# Patient Record
Sex: Female | Born: 1955 | Race: White | Hispanic: No | Marital: Single | Smoking: Former smoker
Health system: Southern US, Community
[De-identification: ages and names within clinical notes are randomized; demographics above are authoritative.]

## PROBLEM LIST (undated history)

## (undated) DIAGNOSIS — E785 Hyperlipidemia, unspecified: Secondary | ICD-10-CM

## (undated) DIAGNOSIS — I10 Essential (primary) hypertension: Secondary | ICD-10-CM

## (undated) DIAGNOSIS — F419 Anxiety disorder, unspecified: Secondary | ICD-10-CM

## (undated) DIAGNOSIS — F329 Major depressive disorder, single episode, unspecified: Secondary | ICD-10-CM

## (undated) DIAGNOSIS — F32A Depression, unspecified: Secondary | ICD-10-CM

## (undated) HISTORY — DX: Hyperlipidemia, unspecified: E78.5

## (undated) HISTORY — PX: ABDOMINAL HYSTERECTOMY: SHX81

## (undated) HISTORY — PX: TONSILLECTOMY: SUR1361

---

## 2005-10-06 ENCOUNTER — Inpatient Hospital Stay (HOSPITAL_COMMUNITY): Admission: AC | Admit: 2005-10-06 | Discharge: 2005-10-14 | Payer: Self-pay

## 2005-10-11 ENCOUNTER — Encounter: Payer: Self-pay | Admitting: Vascular Surgery

## 2005-11-21 ENCOUNTER — Ambulatory Visit (HOSPITAL_COMMUNITY): Admission: RE | Admit: 2005-11-21 | Discharge: 2005-11-21 | Payer: Self-pay | Admitting: Neurosurgery

## 2007-03-29 ENCOUNTER — Ambulatory Visit: Payer: Self-pay | Admitting: *Deleted

## 2007-03-29 ENCOUNTER — Ambulatory Visit: Payer: Self-pay | Admitting: Internal Medicine

## 2007-06-20 ENCOUNTER — Ambulatory Visit: Payer: Self-pay | Admitting: Internal Medicine

## 2007-06-21 ENCOUNTER — Encounter (INDEPENDENT_AMBULATORY_CARE_PROVIDER_SITE_OTHER): Payer: Self-pay | Admitting: Internal Medicine

## 2007-07-18 ENCOUNTER — Encounter: Payer: Self-pay | Admitting: Family Medicine

## 2007-07-18 ENCOUNTER — Ambulatory Visit: Payer: Self-pay | Admitting: Internal Medicine

## 2007-07-18 LAB — CONVERTED CEMR LAB
ALT: 16 units/L (ref 0–35)
AST: 15 units/L (ref 0–37)
Albumin: 4.2 g/dL (ref 3.5–5.2)
Alkaline Phosphatase: 71 units/L (ref 39–117)
BUN: 5 mg/dL — ABNORMAL LOW (ref 6–23)
Basophils Absolute: 0 10*3/uL (ref 0.0–0.1)
Basophils Relative: 1 % (ref 0–1)
CO2: 23 meq/L (ref 19–32)
Calcium: 8.6 mg/dL (ref 8.4–10.5)
Chloride: 106 meq/L (ref 96–112)
Cholesterol: 224 mg/dL — ABNORMAL HIGH (ref 0–200)
Creatinine, Ser: 0.66 mg/dL (ref 0.40–1.20)
Eosinophils Absolute: 0.2 10*3/uL (ref 0.0–0.7)
Eosinophils Relative: 4 % (ref 0–5)
Glucose, Bld: 104 mg/dL — ABNORMAL HIGH (ref 70–99)
HCT: 47 % — ABNORMAL HIGH (ref 36.0–46.0)
HDL: 35 mg/dL — ABNORMAL LOW (ref >39)
Hemoglobin: 15.5 g/dL — ABNORMAL HIGH (ref 12.0–15.0)
LDL Cholesterol: 147 mg/dL — ABNORMAL HIGH (ref 0–99)
Lymphocytes Relative: 17 % (ref 12–46)
Lymphs Abs: 1.2 10*3/uL (ref 0.7–4.0)
MCHC: 33 g/dL (ref 30.0–36.0)
MCV: 94.6 fL (ref 78.0–100.0)
Monocytes Absolute: 0.5 10*3/uL (ref 0.1–1.0)
Monocytes Relative: 7 % (ref 3–12)
Neutro Abs: 5 10*3/uL (ref 1.7–7.7)
Neutrophils Relative %: 72 % (ref 43–77)
Platelets: 317 10*3/uL (ref 150–400)
Potassium: 3.8 meq/L (ref 3.5–5.3)
RBC: 4.97 M/uL (ref 3.87–5.11)
RDW: 14.3 % (ref 11.5–15.5)
Sodium: 141 meq/L (ref 135–145)
TSH: 1.607 microintl units/mL (ref 0.350–5.50)
Total Bilirubin: 0.7 mg/dL (ref 0.3–1.2)
Total CHOL/HDL Ratio: 6.4
Total Protein: 7.5 g/dL (ref 6.0–8.3)
Triglycerides: 209 mg/dL — ABNORMAL HIGH (ref <150)
VLDL: 42 mg/dL — ABNORMAL HIGH (ref 0–40)
WBC: 6.9 10*3/uL (ref 4.0–10.5)

## 2007-07-25 ENCOUNTER — Ambulatory Visit: Payer: Self-pay | Admitting: Internal Medicine

## 2007-07-30 ENCOUNTER — Ambulatory Visit (HOSPITAL_COMMUNITY): Admission: RE | Admit: 2007-07-30 | Discharge: 2007-07-30 | Payer: Self-pay | Admitting: Family Medicine

## 2008-05-30 IMAGING — CR DG CHEST 1V PORT
1 series · 1 of 1 positions shown · non-contrast
Comparison: 10/08/05.

CLINICAL DATA: Pneumothorax.
 PORTABLE CHEST - 1 VIEW - 10/09/05:

[view not recorded]
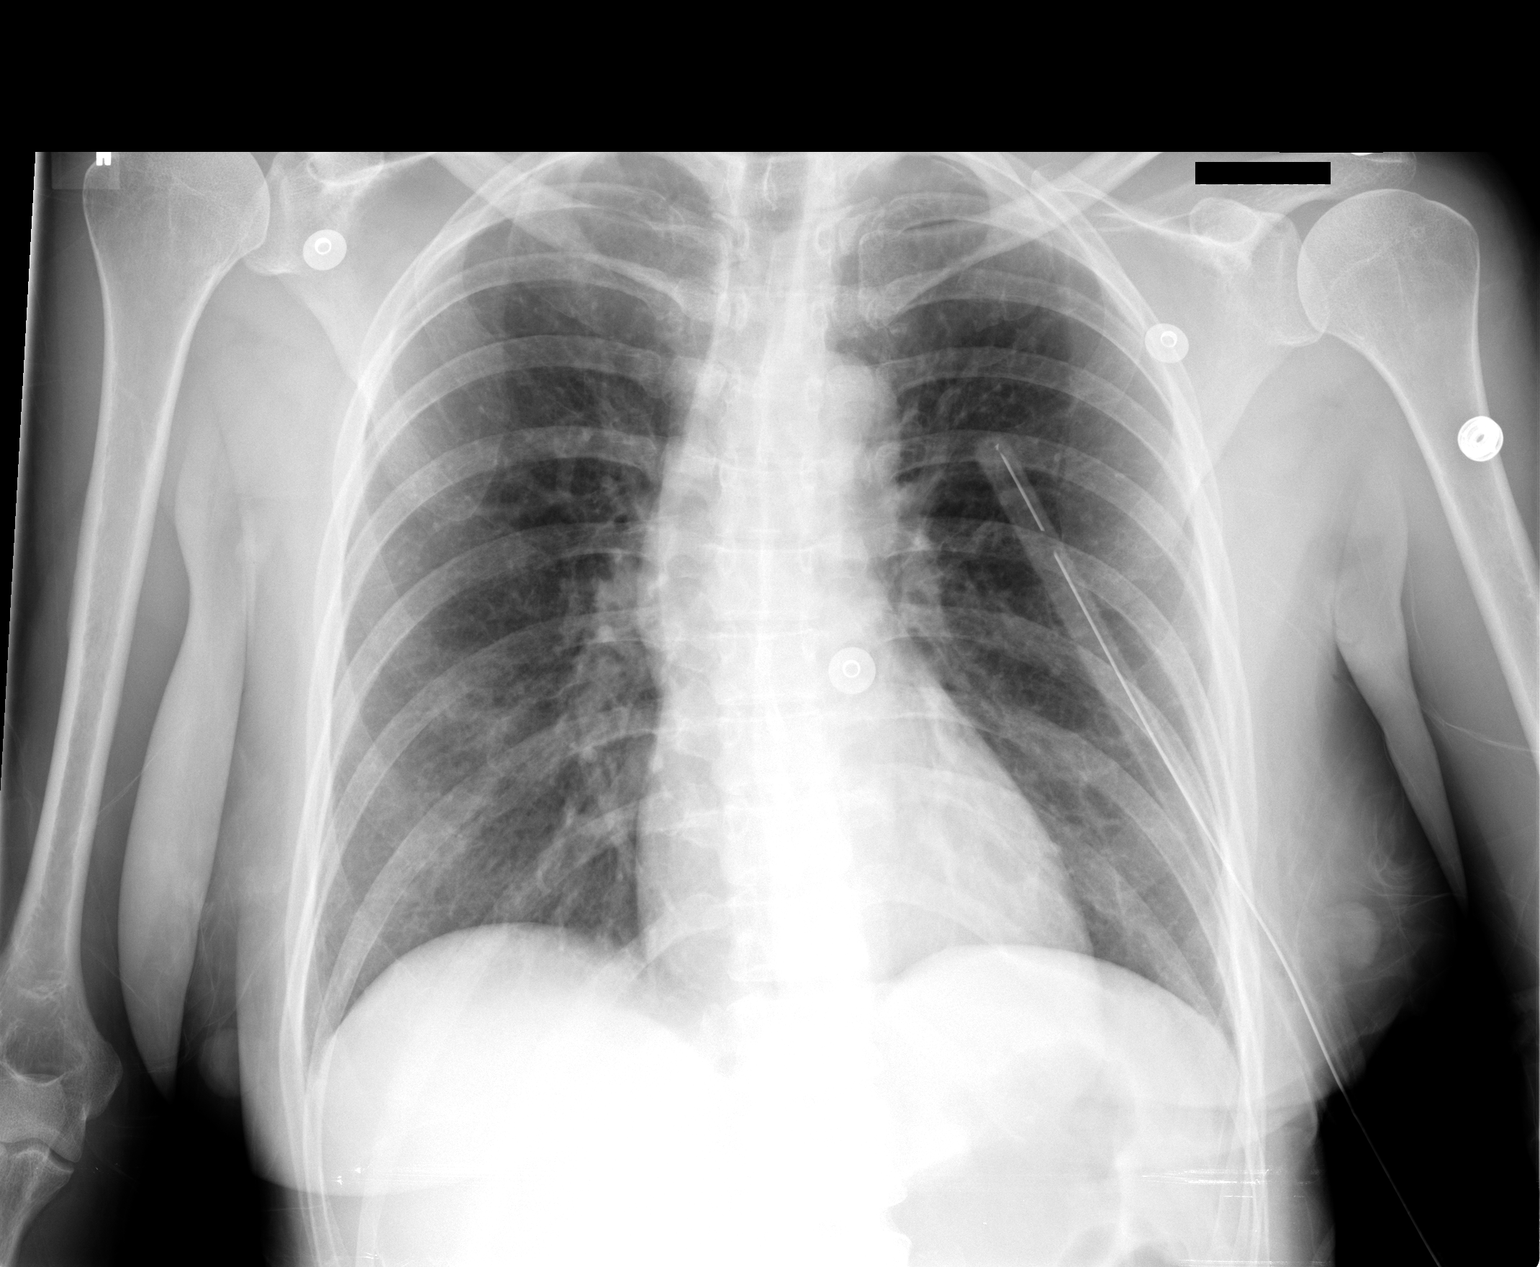

[1 of 1 positions shown; findings below may reference images not displayed]

FINDINGS: A left-sided chest tube remains in place.  There is a persistent left apical pneumothorax, less than 5%.  No mediastinal shift.  No other change.
IMPRESSION: Stable tiny left apical pneumothorax.

## 2008-06-02 IMAGING — CR DG CHEST 1V PORT
1 series · 1 of 1 positions shown · non-contrast
Comparison: 10/11/05.

CLINICAL DATA: Pneumothorax. 
 PORTABLE CHEST ? 1 VIEW ? 10/12/05 AT 0000 HOURS:

[view not recorded]
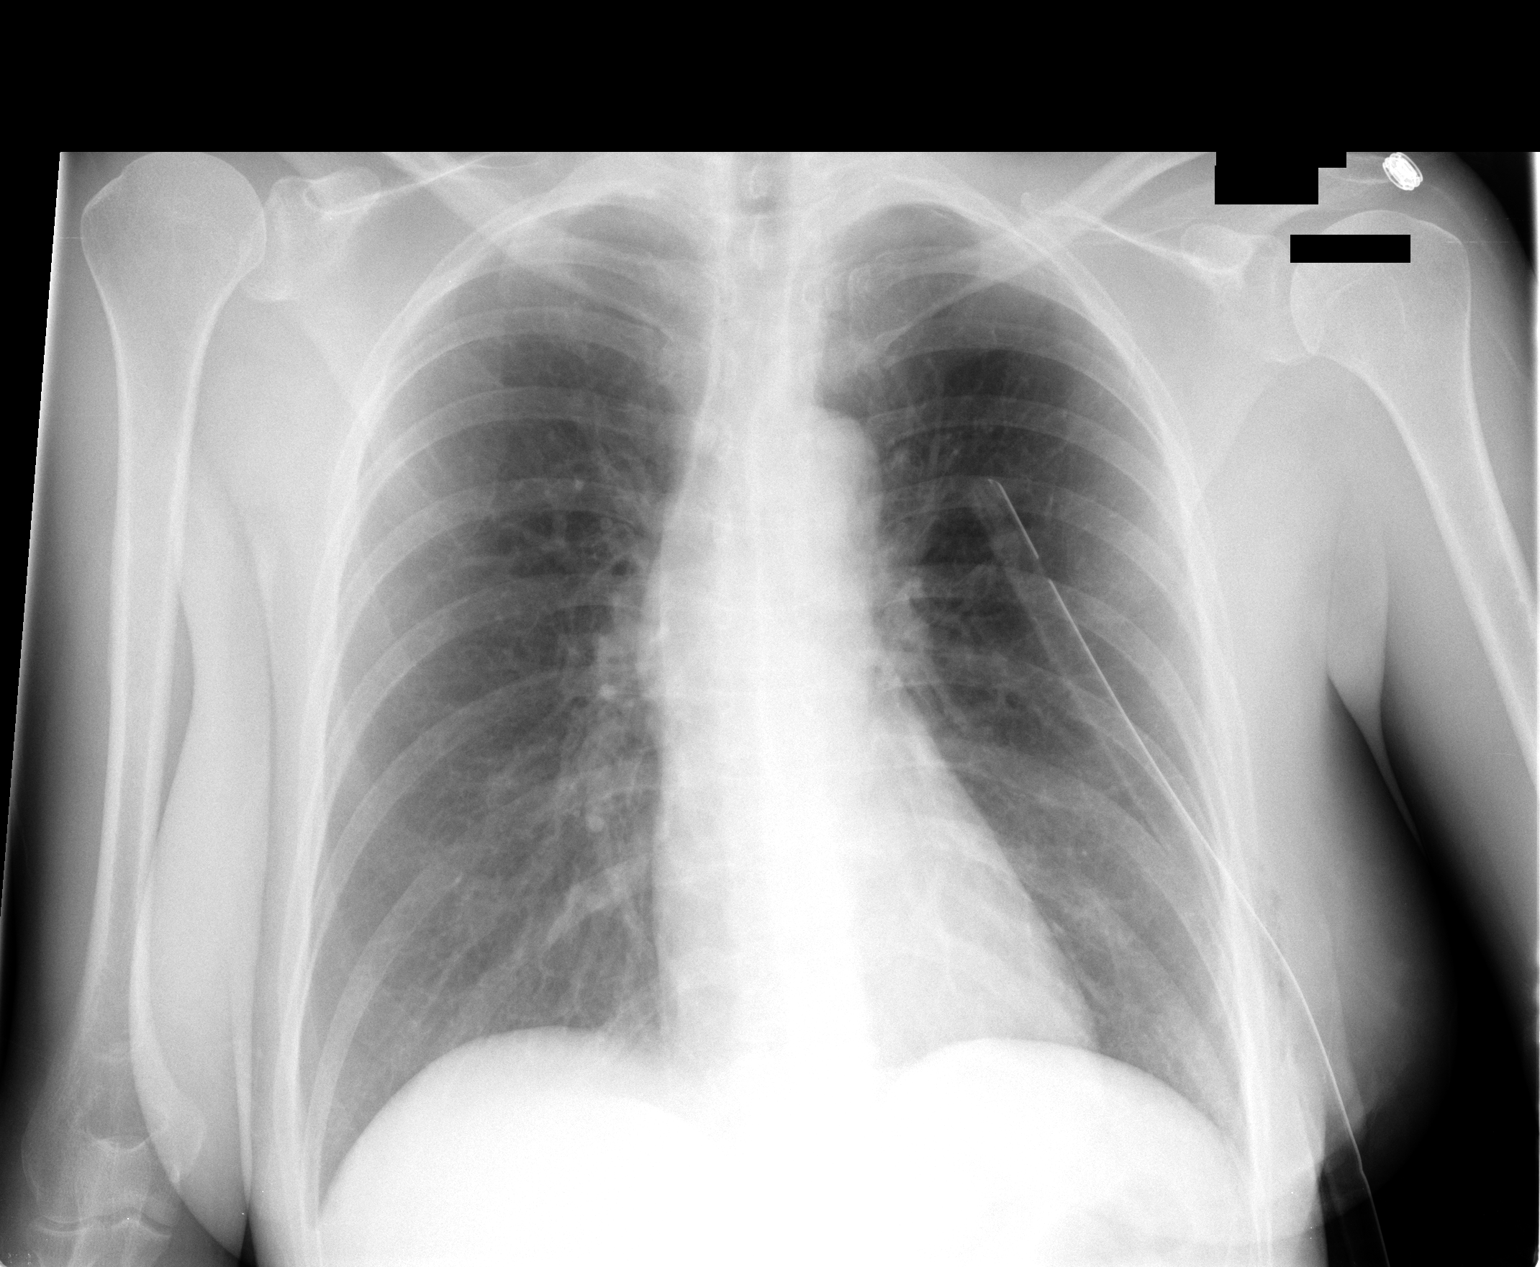

[1 of 1 positions shown; findings below may reference images not displayed]

FINDINGS: The left apical pneumothorax has nearly resolved.  The chest tube is stable.  Lungs are clear.  Heart is normal in size.
IMPRESSION: Nearly resolved left pneumothorax.  Stable left chest tube.

## 2008-06-03 IMAGING — CR DG CHEST 1V PORT
1 series · 1 of 1 positions shown · non-contrast
Comparison: 10/12/05 at [DATE] a.m.

CLINICAL DATA: Follow-up.
 PORTABLE AP SEMIERECT CHEST ? 10/13/05 AT [DATE] A.M.:

[view not recorded]
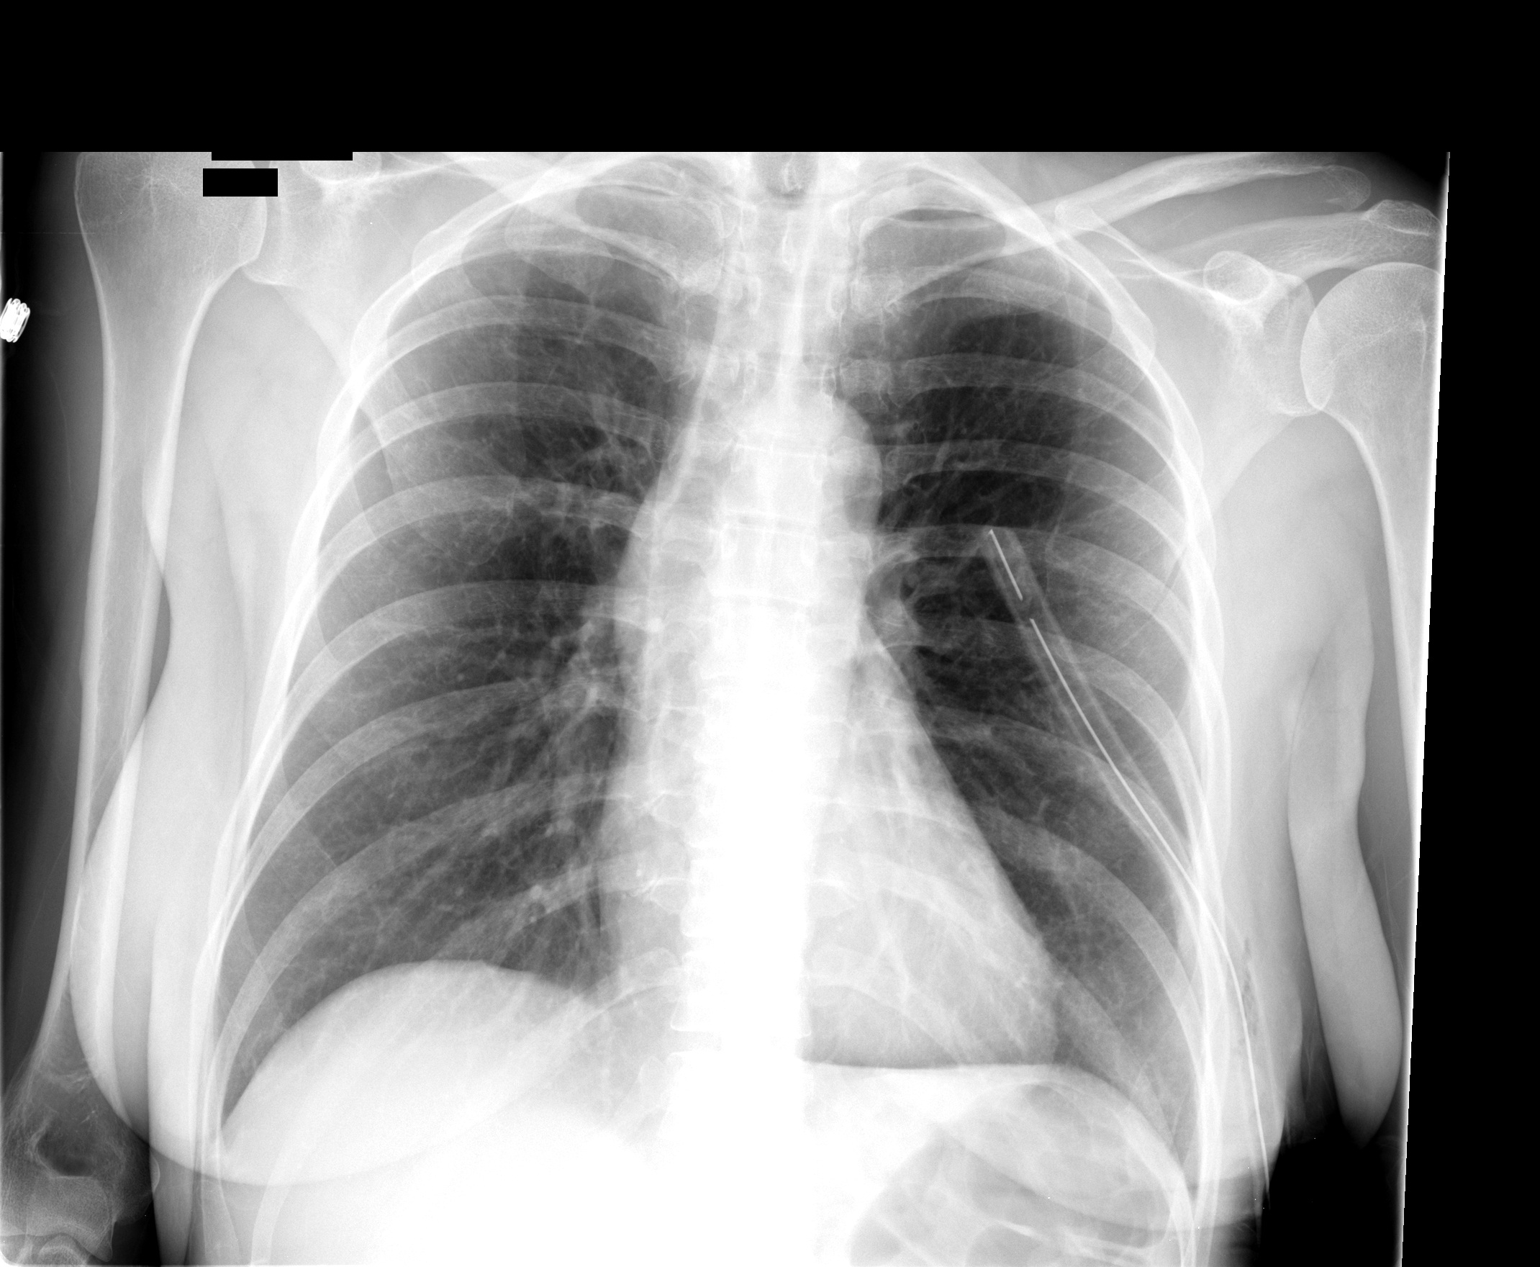

[1 of 1 positions shown; findings below may reference images not displayed]

FINDINGS: A left-sided chest tube remains in place.  At most, there is a miniscule left apical pneumothorax.  There is slight uncoiling of the ascending aorta.  The remainder of the findings are unchanged.
IMPRESSION: At most, miniscule left apical pneumothorax remains.

## 2008-09-19 ENCOUNTER — Emergency Department (HOSPITAL_COMMUNITY): Admission: EM | Admit: 2008-09-19 | Discharge: 2008-09-19 | Payer: Self-pay | Admitting: Family Medicine

## 2009-08-06 ENCOUNTER — Inpatient Hospital Stay (HOSPITAL_COMMUNITY): Admission: AC | Admit: 2009-08-06 | Discharge: 2009-08-13 | Payer: Self-pay | Source: Home / Self Care

## 2009-10-21 ENCOUNTER — Encounter: Admission: RE | Admit: 2009-10-21 | Discharge: 2009-10-21 | Payer: Self-pay | Admitting: Neurosurgery

## 2010-05-02 ENCOUNTER — Encounter: Payer: Self-pay | Admitting: Neurosurgery

## 2010-06-29 LAB — BASIC METABOLIC PANEL
BUN: 2 mg/dL — ABNORMAL LOW (ref 6–23)
BUN: 5 mg/dL — ABNORMAL LOW (ref 6–23)
BUN: 5 mg/dL — ABNORMAL LOW (ref 6–23)
CO2: 29 mEq/L (ref 19–32)
CO2: 21 mEq/L (ref 19–32)
CO2: 22 mEq/L (ref 19–32)
Calcium: 8.6 mg/dL (ref 8.4–10.5)
Calcium: 7.3 mg/dL — ABNORMAL LOW (ref 8.4–10.5)
Calcium: 7.8 mg/dL — ABNORMAL LOW (ref 8.4–10.5)
Chloride: 107 mEq/L (ref 96–112)
Chloride: 109 mEq/L (ref 96–112)
Chloride: 111 mEq/L (ref 96–112)
Creatinine, Ser: 0.56 mg/dL (ref 0.4–1.2)
Creatinine, Ser: 0.55 mg/dL (ref 0.4–1.2)
Creatinine, Ser: 0.95 mg/dL (ref 0.4–1.2)
GFR calc Af Amer: >60 mL/min (ref >60)
GFR calc Af Amer: >60 mL/min (ref >60)
GFR calc Af Amer: >60 mL/min (ref >60)
GFR calc non Af Amer: >60 mL/min (ref >60)
GFR calc non Af Amer: >60 mL/min (ref >60)
GFR calc non Af Amer: >60 mL/min (ref >60)
Glucose, Bld: 100 mg/dL — ABNORMAL HIGH (ref 70–99)
Glucose, Bld: 103 mg/dL — ABNORMAL HIGH (ref 70–99)
Glucose, Bld: 110 mg/dL — ABNORMAL HIGH (ref 70–99)
Potassium: 3.5 mEq/L (ref 3.5–5.1)
Potassium: 3.1 mEq/L — ABNORMAL LOW (ref 3.5–5.1)
Potassium: 3.8 mEq/L (ref 3.5–5.1)
Sodium: 139 mEq/L (ref 135–145)
Sodium: 137 mEq/L (ref 135–145)
Sodium: 138 mEq/L (ref 135–145)

## 2010-06-29 LAB — CBC
HCT: 30.4 % — ABNORMAL LOW (ref 36.0–46.0)
HCT: 30.3 % — ABNORMAL LOW (ref 36.0–46.0)
HCT: 35 % — ABNORMAL LOW (ref 36.0–46.0)
HCT: 43.9 % (ref 36.0–46.0)
Hemoglobin: 10.5 g/dL — ABNORMAL LOW (ref 12.0–15.0)
Hemoglobin: 10.8 g/dL — ABNORMAL LOW (ref 12.0–15.0)
Hemoglobin: 12.3 g/dL (ref 12.0–15.0)
Hemoglobin: 15.5 g/dL — ABNORMAL HIGH (ref 12.0–15.0)
MCHC: 34.7 g/dL (ref 30.0–36.0)
MCHC: 35.2 g/dL (ref 30.0–36.0)
MCHC: 35.2 g/dL (ref 30.0–36.0)
MCHC: 35.5 g/dL (ref 30.0–36.0)
MCV: 100.5 fL — ABNORMAL HIGH (ref 78.0–100.0)
MCV: 97.8 fL (ref 78.0–100.0)
MCV: 99.3 fL (ref 78.0–100.0)
MCV: 99.3 fL (ref 78.0–100.0)
Platelets: 211 10*3/uL (ref 150–400)
Platelets: 157 10*3/uL (ref 150–400)
Platelets: 209 10*3/uL (ref 150–400)
Platelets: 259 10*3/uL (ref 150–400)
RBC: 3.02 MIL/uL — ABNORMAL LOW (ref 3.87–5.11)
RBC: 3.05 MIL/uL — ABNORMAL LOW (ref 3.87–5.11)
RBC: 3.52 MIL/uL — ABNORMAL LOW (ref 3.87–5.11)
RBC: 4.49 MIL/uL (ref 3.87–5.11)
RDW: 16 % — ABNORMAL HIGH (ref 11.5–15.5)
RDW: 16.4 % — ABNORMAL HIGH (ref 11.5–15.5)
RDW: 16.7 % — ABNORMAL HIGH (ref 11.5–15.5)
RDW: 16.8 % — ABNORMAL HIGH (ref 11.5–15.5)
WBC: 5 10*3/uL (ref 4.0–10.5)
WBC: 10 10*3/uL (ref 4.0–10.5)
WBC: 4.9 10*3/uL (ref 4.0–10.5)
WBC: 8 10*3/uL (ref 4.0–10.5)

## 2010-06-29 LAB — TYPE AND SCREEN
ABO/RH(D): O POS
Antibody Screen: NEGATIVE

## 2010-06-29 LAB — BLOOD GAS, ARTERIAL
Acid-base deficit: 10.9 mmol/L — ABNORMAL HIGH (ref 0.0–2.0)
Bicarbonate: 15 mEq/L — ABNORMAL LOW (ref 20.0–24.0)
FIO2: 0.4 %
MECHVT: 500 mL
O2 Saturation: 98.1 %
PEEP: 5 cmH2O
Patient temperature: 98
RATE: 14 resp/min
TCO2: 16.1 mmol/L (ref 0–100)
pCO2 arterial: 35.1 mmHg (ref 35.0–45.0)
pH, Arterial: 7.251 — ABNORMAL LOW (ref 7.350–7.400)
pO2, Arterial: 134 mmHg — ABNORMAL HIGH (ref 80.0–100.0)

## 2010-06-29 LAB — POCT CARDIAC MARKERS
CKMB, poc: 3.2 ng/mL (ref 1.0–8.0)
Myoglobin, poc: 448 ng/mL (ref 12–200)
Troponin i, poc: <0.05 ng/mL (ref 0.00–0.09)

## 2010-06-29 LAB — COMPREHENSIVE METABOLIC PANEL
ALT: 70 U/L — ABNORMAL HIGH (ref 0–35)
AST: 162 U/L — ABNORMAL HIGH (ref 0–37)
Albumin: 4.1 g/dL (ref 3.5–5.2)
Alkaline Phosphatase: 69 U/L (ref 39–117)
BUN: 4 mg/dL — ABNORMAL LOW (ref 6–23)
CO2: 20 mEq/L (ref 19–32)
Calcium: 9.1 mg/dL (ref 8.4–10.5)
Chloride: 106 mEq/L (ref 96–112)
Creatinine, Ser: 0.77 mg/dL (ref 0.4–1.2)
GFR calc Af Amer: >60 mL/min (ref >60)
GFR calc non Af Amer: >60 mL/min (ref >60)
Glucose, Bld: 122 mg/dL — ABNORMAL HIGH (ref 70–99)
Potassium: 3.6 mEq/L (ref 3.5–5.1)
Sodium: 137 mEq/L (ref 135–145)
Total Bilirubin: 1 mg/dL (ref 0.3–1.2)
Total Protein: 7.5 g/dL (ref 6.0–8.3)

## 2010-06-29 LAB — POCT I-STAT, CHEM 8
BUN: <3 mg/dL — ABNORMAL LOW (ref 6–23)
Calcium, Ion: 1.01 mmol/L — ABNORMAL LOW (ref 1.12–1.32)
Chloride: 107 mEq/L (ref 96–112)
Creatinine, Ser: 0.9 mg/dL (ref 0.4–1.2)
Glucose, Bld: 116 mg/dL — ABNORMAL HIGH (ref 70–99)
HCT: 47 % — ABNORMAL HIGH (ref 36.0–46.0)
Hemoglobin: 16 g/dL — ABNORMAL HIGH (ref 12.0–15.0)
Potassium: 3.7 meq/L (ref 3.5–5.1)
Sodium: 138 mEq/L (ref 135–145)
TCO2: 19 mmol/L (ref 0–100)

## 2010-06-29 LAB — MRSA PCR SCREENING: MRSA by PCR: NEGATIVE

## 2010-06-29 LAB — PROTIME-INR
INR: 1.1 (ref 0.00–1.49)
Prothrombin Time: 14.1 seconds (ref 11.6–15.2)

## 2010-06-29 LAB — ABO/RH: ABO/RH(D): O POS

## 2010-06-29 LAB — LACTIC ACID, PLASMA: Lactic Acid, Venous: 2.9 mmol/L — ABNORMAL HIGH (ref 0.5–2.2)

## 2010-06-29 LAB — APTT: aPTT: 26 seconds (ref 24–37)
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
## 2011-08-15 ENCOUNTER — Ambulatory Visit (INDEPENDENT_AMBULATORY_CARE_PROVIDER_SITE_OTHER): Payer: Self-pay | Admitting: Family Medicine

## 2011-08-15 ENCOUNTER — Encounter: Payer: Self-pay | Admitting: Family Medicine

## 2011-08-15 VITALS — BP 159/114 | HR 86 | Temp 98.5°F | Ht 61.0 in | Wt 136.0 lb

## 2011-08-15 DIAGNOSIS — R21 Rash and other nonspecific skin eruption: Secondary | ICD-10-CM

## 2011-08-15 DIAGNOSIS — I10 Essential (primary) hypertension: Secondary | ICD-10-CM

## 2011-08-15 MED ORDER — HYDROXYZINE PAMOATE 100 MG PO CAPS: 100.0000 mg | ORAL_CAPSULE | Freq: Three times a day (TID) | ORAL | Status: AC | PRN

## 2011-08-15 MED ORDER — HYDROCHLOROTHIAZIDE 25 MG PO TABS: 25.0000 mg | ORAL_TABLET | Freq: Every day | ORAL | Status: DC

## 2011-08-15 MED ORDER — TRIAMCINOLONE ACETONIDE 0.1 % EX CREA: TOPICAL_CREAM | Freq: Two times a day (BID) | CUTANEOUS | Status: DC

## 2011-08-15 NOTE — Assessment & Plan Note (Signed)
Unclear etiology-wonder if it is flea bites or scabies, although pt. Denies pets and has no intertrigonal lesions.

## 2011-08-15 NOTE — Patient Instructions (Addendum)
Rash A rash is a change in the color or texture of your skin. There are many different types of rashes. You may have other problems that accompany your rash. CAUSES   Infections.   Allergic reactions. This can include allergies to pets or foods.   Certain medicines.   Exposure to certain chemicals, soaps, or cosmetics.   Heat.   Exposure to poisonous plants.   Tumors, both cancerous and noncancerous.  SYMPTOMS   Redness.   Scaly skin.   Itchy skin.   Dry or cracked skin.   Bumps.   Blisters.   Pain.  DIAGNOSIS  Your caregiver may do a physical exam to determine what type of rash you have. A skin sample (biopsy) may be taken and examined under a microscope. TREATMENT  Treatment depends on the type of rash you have. Your caregiver may prescribe certain medicines. For serious conditions, you may need to see a skin doctor (dermatologist). HOME CARE INSTRUCTIONS   Avoid the substance that caused your rash.   Do not scratch your rash. This can cause infection.   You may take cool baths to help stop itching.   Only take over-the-counter or prescription medicines as directed by your caregiver.   Keep all follow-up appointments as directed by your caregiver.  SEEK IMMEDIATE MEDICAL CARE IF:  You have increasing pain, swelling, or redness.   You have a fever.   You have new or severe symptoms.   You have body aches, diarrhea, or vomiting.   Your rash is not better after 3 days.  MAKE SURE YOU:  Understand these instructions.   Will watch your condition.   Will get help right away if you are not doing well or get worse.  Document Released: 03/18/2002 Document Revised: 03/17/2011 Document Reviewed: 01/10/2011 [facility] Patient Information 2012 [place], [place]. Hypertension As your heart beats, it forces blood through your arteries. This force is your blood pressure. If the pressure is too high, it is called hypertension (HTN) or high blood pressure. HTN is  dangerous because you may have it and not know it. High blood pressure may mean that your heart has to work harder to pump blood. Your arteries may be narrow or stiff. The extra work puts you at risk for heart disease, stroke, and other problems.  Blood pressure consists of two numbers, a higher number over a lower, 110/72, for example. It is stated as "110 over 72." The ideal is below 120 for the top number (systolic) and under 80 for the bottom (diastolic). Write down your blood pressure today. You should pay close attention to your blood pressure if you have certain conditions such as:  Heart failure.   Prior heart attack.   Diabetes   Chronic kidney disease.   Prior stroke.   Multiple risk factors for heart disease.  To see if you have HTN, your blood pressure should be measured while you are seated with your arm held at the level of the heart. It should be measured at least twice. A one-time elevated blood pressure reading (especially in the Emergency Department) does not mean that you need treatment. There may be conditions in which the blood pressure is different between your right and left arms. It is important to see your caregiver soon for a recheck. Most people have essential hypertension which means that there is not a specific cause. This type of high blood pressure may be lowered by changing lifestyle factors such as:  Stress.   Smoking.  Lack of exercise.   Excessive weight.   Drug/tobacco/alcohol use.   Eating less salt.  Most people do not have symptoms from high blood pressure until it has caused damage to the body. Effective treatment can often prevent, delay or reduce that damage. TREATMENT  When a cause has been identified, treatment for high blood pressure is directed at the cause. There are a large number of medications to treat HTN. These fall into several categories, and your caregiver will help you select the medicines that are best for you. Medications may  have side effects. You should review side effects with your caregiver. If your blood pressure stays high after you have made lifestyle changes or started on medicines,   Your medication(s) may need to be changed.   Other problems may need to be addressed.   Be certain you understand your prescriptions, and know how and when to take your medicine.   Be sure to follow up with your caregiver within the time frame advised (usually within two weeks) to have your blood pressure rechecked and to review your medications.   If you are taking more than one medicine to lower your blood pressure, make sure you know how and at what times they should be taken. Taking two medicines at the same time can result in blood pressure that is too low.  SEEK IMMEDIATE MEDICAL CARE IF:  You develop a severe headache, blurred or changing vision, or confusion.   You have unusual weakness or numbness, or a faint feeling.   You have severe chest or abdominal pain, vomiting, or breathing problems.  MAKE SURE YOU:   Understand these instructions.   Will watch your condition.   Will get help right away if you are not doing well or get worse.  Document Released: 03/28/2005 Document Revised: 03/17/2011 Document Reviewed: 11/16/2007 [facility] Patient Information 2012 [place], [place].

## 2011-08-15 NOTE — Assessment & Plan Note (Signed)
Has held diagnosis for a while.  BP is up today--will start medication.
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
## 2011-08-31 ENCOUNTER — Ambulatory Visit: Payer: Self-pay | Admitting: Family Medicine

## 2011-09-06 ENCOUNTER — Encounter: Payer: Self-pay | Admitting: Family Medicine

## 2011-09-06 ENCOUNTER — Ambulatory Visit (INDEPENDENT_AMBULATORY_CARE_PROVIDER_SITE_OTHER): Payer: Self-pay | Admitting: Family Medicine

## 2011-09-06 VITALS — BP 148/97 | HR 93 | Temp 98.1°F | Ht 61.0 in | Wt 134.0 lb

## 2011-09-06 DIAGNOSIS — I10 Essential (primary) hypertension: Secondary | ICD-10-CM

## 2011-09-06 DIAGNOSIS — M545 Low back pain, unspecified: Secondary | ICD-10-CM

## 2011-09-06 DIAGNOSIS — Z1231 Encounter for screening mammogram for malignant neoplasm of breast: Secondary | ICD-10-CM

## 2011-09-06 DIAGNOSIS — R21 Rash and other nonspecific skin eruption: Secondary | ICD-10-CM

## 2011-09-06 DIAGNOSIS — Z0000 Encounter for general adult medical examination without abnormal findings: Secondary | ICD-10-CM

## 2011-09-06 DIAGNOSIS — Z23 Encounter for immunization: Secondary | ICD-10-CM

## 2011-09-06 LAB — LIPID PANEL
Cholesterol: 283 mg/dL — ABNORMAL HIGH (ref 0–200)
HDL: 29 mg/dL — ABNORMAL LOW (ref >39)
LDL Cholesterol: 206 mg/dL — ABNORMAL HIGH (ref 0–99)
Total CHOL/HDL Ratio: 9.8 Ratio
Triglycerides: 241 mg/dL — ABNORMAL HIGH (ref <150)
VLDL: 48 mg/dL — ABNORMAL HIGH (ref 0–40)

## 2011-09-06 LAB — CBC
HCT: 51.8 % — ABNORMAL HIGH (ref 36.0–46.0)
Hemoglobin: 17.9 g/dL — ABNORMAL HIGH (ref 12.0–15.0)
MCH: 29.8 pg (ref 26.0–34.0)
MCHC: 34.6 g/dL (ref 30.0–36.0)
MCV: 86.2 fL (ref 78.0–100.0)
Platelets: 325 10*3/uL (ref 150–400)
RBC: 6.01 MIL/uL — ABNORMAL HIGH (ref 3.87–5.11)
RDW: 14 % (ref 11.5–15.5)
WBC: 7.8 10*3/uL (ref 4.0–10.5)

## 2011-09-06 LAB — TSH: TSH: 2.708 u[IU]/mL (ref 0.350–4.500)

## 2011-09-06 LAB — COMPREHENSIVE METABOLIC PANEL
ALT: 22 U/L (ref 0–35)
AST: 22 U/L (ref 0–37)
Albumin: 4.4 g/dL (ref 3.5–5.2)
Alkaline Phosphatase: 71 U/L (ref 39–117)
BUN: 6 mg/dL (ref 6–23)
CO2: 28 mEq/L (ref 19–32)
Calcium: 9.3 mg/dL (ref 8.4–10.5)
Chloride: 98 mEq/L (ref 96–112)
Creat: 0.91 mg/dL (ref 0.50–1.10)
Glucose, Bld: 105 mg/dL — ABNORMAL HIGH (ref 70–99)
Potassium: 3.9 mEq/L (ref 3.5–5.3)
Sodium: 137 mEq/L (ref 135–145)
Total Bilirubin: 0.7 mg/dL (ref 0.3–1.2)
Total Protein: 7.4 g/dL (ref 6.0–8.3)

## 2011-09-06 MED ORDER — CYCLOBENZAPRINE HCL 10 MG PO TABS: 10.0000 mg | ORAL_TABLET | Freq: Three times a day (TID) | ORAL | Status: AC | PRN

## 2011-09-06 MED ORDER — HYDROXYZINE PAMOATE 50 MG PO CAPS: 50.0000 mg | ORAL_CAPSULE | Freq: Three times a day (TID) | ORAL | Status: AC | PRN

## 2011-09-06 MED ORDER — HYDROCHLOROTHIAZIDE 25 MG PO TABS: 50.0000 mg | ORAL_TABLET | Freq: Every day | ORAL | Status: DC

## 2011-09-06 MED ORDER — DICLOFENAC SODIUM 75 MG PO TBEC: 75.0000 mg | DELAYED_RELEASE_TABLET | Freq: Two times a day (BID) | ORAL | Status: DC

## 2011-09-06 MED ORDER — TRIAMCINOLONE ACETONIDE 0.1 % EX CREA: TOPICAL_CREAM | Freq: Two times a day (BID) | CUTANEOUS | Status: AC

## 2011-09-06 NOTE — Assessment & Plan Note (Signed)
Increase her HCTZ

## 2011-09-06 NOTE — Assessment & Plan Note (Signed)
Trial of NSAIDS and muscle relaxant.

## 2011-09-06 NOTE — Patient Instructions (Signed)
Back Pain, Adult Low back pain is very common. About 1 in 5 people have back pain.The cause of low back pain is rarely dangerous. The pain often gets better over time.About half of people with a sudden onset of back pain feel better in just 2 weeks. About 8 in 10 people feel better by 6 weeks.  CAUSES Some common causes of back pain include:  Strain of the muscles or ligaments supporting the spine.   Wear and tear (degeneration) of the spinal discs.   Arthritis.   Direct injury to the back.  DIAGNOSIS Most of the time, the direct cause of low back pain is not known.However, back pain can be treated effectively even when the exact cause of the pain is unknown.Answering your caregiver's questions about your overall health and symptoms is one of the most accurate ways to make sure the cause of your pain is not dangerous. If your caregiver needs more information, he or she may order lab work or imaging tests (X-rays or MRIs).However, even if imaging tests show changes in your back, this usually does not require surgery. HOME CARE INSTRUCTIONS For many people, back pain returns.Since low back pain is rarely dangerous, it is often a condition that people can learn to manageon their own.   Remain active. It is stressful on the back to sit or stand in one place. Do not sit, drive, or stand in one place for more than 30 minutes at a time. Take short walks on level surfaces as soon as pain allows.Try to increase the length of time you walk each day.   Do not stay in bed.Resting more than 1 or 2 days can delay your recovery.   Do not avoid exercise or work.Your body is made to move.It is not dangerous to be active, even though your back may hurt.Your back will likely heal faster if you return to being active before your pain is gone.   Pay attention to your body when you bend and lift. Many people have less discomfortwhen lifting if they bend their knees, keep the load close to their  bodies,and avoid twisting. Often, the most comfortable positions are those that put less stress on your recovering back.   Find a comfortable position to sleep. Use a firm mattress and lie on your side with your knees slightly bent. If you lie on your back, put a pillow under your knees.   Only take over-the-counter or prescription medicines as directed by your caregiver. Over-the-counter medicines to reduce pain and inflammation are often the most helpful.Your caregiver may prescribe muscle relaxant drugs.These medicines help dull your pain so you can more quickly return to your normal activities and healthy exercise.   Put ice on the injured area.   Put ice in a plastic bag.   Place a towel between your skin and the bag.   Leave the ice on for 15 to 20 minutes, 3 to 4 times a day for the first 2 to 3 days. After that, ice and heat may be alternated to reduce pain and spasms.   Ask your caregiver about trying back exercises and gentle massage. This may be of some benefit.   Avoid feeling anxious or stressed.Stress increases muscle tension and can worsen back pain.It is important to recognize when you are anxious or stressed and learn ways to manage it.Exercise is a great option.  SEEK MEDICAL CARE IF:  You have pain that is not relieved with rest or medicine.   You have   pain that does not improve in 1 week.   You have new symptoms.   You are generally not feeling well.  SEEK IMMEDIATE MEDICAL CARE IF:   You have pain that radiates from your back into your legs.   You develop new bowel or bladder control problems.   You have unusual weakness or numbness in your arms or legs.   You develop nausea or vomiting.   You develop abdominal pain.   You feel faint.  Document Released: 03/28/2005 Document Revised: 03/17/2011 Document Reviewed: 08/16/2010 [facility] Patient Information 2012 [place], [place].Preventive Care for Adults, Female A healthy lifestyle and preventive care can  promote health and wellness. Preventive health guidelines for women include the following key practices.  A routine yearly physical is a good way to check with your caregiver about your health and preventive screening. It is a chance to share any concerns and updates on your health, and to receive a thorough exam.   Visit your dentist for a routine exam and preventive care every 6 months. Brush your teeth twice a day and floss once a day. Good oral hygiene prevents tooth decay and gum disease.   The frequency of eye exams is based on your age, health, family medical history, use of contact lenses, and other factors. Follow your caregiver's recommendations for frequency of eye exams.   Eat a healthy diet. Foods like vegetables, fruits, whole grains, low-fat dairy products, and lean protein foods contain the nutrients you need without too many calories. Decrease your intake of foods high in solid fats, added sugars, and salt. Eat the right amount of calories for you.Get information about a proper diet from your caregiver, if necessary.   Regular physical exercise is one of the most important things you can do for your health. Most adults should get at least 150 minutes of moderate-intensity exercise (any activity that increases your heart rate and causes you to sweat) each week. In addition, most adults need muscle-strengthening exercises on 2 or more days a week.   Maintain a healthy weight. The body mass index (BMI) is a screening tool to identify possible weight problems. It provides an estimate of body fat based on height and weight. Your caregiver can help determine your BMI, and can help you achieve or maintain a healthy weight.For adults 20 years and older:   A BMI below 18.5 is considered underweight.   A BMI of 18.5 to 24.9 is normal.   A BMI of 25 to 29.9 is considered overweight.   A BMI of 30 and above is considered obese.   Maintain normal blood lipids and cholesterol levels by  exercising and minimizing your intake of saturated fat. Eat a balanced diet with plenty of fruit and vegetables. Blood tests for lipids and cholesterol should begin at age 65 and be repeated every 5 years. If your lipid or cholesterol levels are high, you are over 50, or you are at high risk for heart disease, you may need your cholesterol levels checked more frequently.Ongoing high lipid and cholesterol levels should be treated with medicines if diet and exercise are not effective.   If you smoke, find out from your caregiver how to quit. If you do not use tobacco, do not start.   If you are pregnant, do not drink alcohol. If you are breastfeeding, be very cautious about drinking alcohol. If you are not pregnant and choose to drink alcohol, do not exceed 1 drink per day. One drink is considered to be 12  ounces (355 mL) of beer, 5 ounces (148 mL) of wine, or 1.5 ounces (44 mL) of liquor.   Avoid use of street drugs. Do not share needles with anyone. Ask for help if you need support or instructions about stopping the use of drugs.   High blood pressure causes heart disease and increases the risk of stroke. Your blood pressure should be checked at least every 1 to 2 years. Ongoing high blood pressure should be treated with medicines if weight loss and exercise are not effective.   If you are 44 to 55 years old, ask your caregiver if you should take aspirin to prevent strokes.   Diabetes screening involves taking a blood sample to check your fasting blood sugar level. This should be done once every 3 years, after age 53, if you are within normal weight and without risk factors for diabetes. Testing should be considered at a younger age or be carried out more frequently if you are overweight and have at least 1 risk factor for diabetes.   Breast cancer screening is essential preventive care for women. You should practice "breast self-awareness." This means understanding the normal appearance and feel of  your breasts and may include breast self-examination. Any changes detected, no matter how small, should be reported to a caregiver. Women in their 66s and 30s should have a clinical breast exam (CBE) by a caregiver as part of a regular health exam every 1 to 3 years. After age 9, women should have a CBE every year. Starting at age 80, women should consider having a mammography (breast X-ray test) every year. Women who have a family history of breast cancer should talk to their caregiver about genetic screening. Women at a high risk of breast cancer should talk to their caregivers about having magnetic resonance imaging (MRI) and a mammography every year.   The Pap test is a screening test for cervical cancer. A Pap test can show cell changes on the cervix that might become cervical cancer if left untreated. A Pap test is a procedure in which cells are obtained and examined from the lower end of the uterus (cervix).   Women should have a Pap test starting at age 21.   Between ages 31 and 31, Pap tests should be repeated every 2 years.   Beginning at age 32, you should have a Pap test every 3 years as long as the past 3 Pap tests have been normal.   Some women have medical problems that increase the chance of getting cervical cancer. Talk to your caregiver about these problems. It is especially important to talk to your caregiver if a new problem develops soon after your last Pap test. In these cases, your caregiver may recommend more frequent screening and Pap tests.   The above recommendations are the same for women who have or have not gotten the vaccine for human papillomavirus (HPV).   If you had a hysterectomy for a problem that was not cancer or a condition that could lead to cancer, then you no longer need Pap tests. Even if you no longer need a Pap test, a regular exam is a good idea to make sure no other problems are starting.   If you are between ages 46 and 49, and you have had normal Pap  tests going back 10 years, you no longer need Pap tests. Even if you no longer need a Pap test, a regular exam is a good idea to make sure no other  problems are starting.   If you have had past treatment for cervical cancer or a condition that could lead to cancer, you need Pap tests and screening for cancer for at least 20 years after your treatment.   If Pap tests have been discontinued, risk factors (such as a new sexual partner) need to be reassessed to determine if screening should be resumed.   The HPV test is an additional test that may be used for cervical cancer screening. The HPV test looks for the virus that can cause the cell changes on the cervix. The cells collected during the Pap test can be tested for HPV. The HPV test could be used to screen women aged 29 years and older, and should be used in women of any age who have unclear Pap test results. After the age of 4, women should have HPV testing at the same frequency as a Pap test.   Colorectal cancer can be detected and often prevented. Most routine colorectal cancer screening begins at the age of 66 and continues through age 95. However, your caregiver may recommend screening at an earlier age if you have risk factors for colon cancer. On a yearly basis, your caregiver may provide home test kits to check for hidden blood in the stool. Use of a small camera at the end of a tube, to directly examine the colon (sigmoidoscopy or colonoscopy), can detect the earliest forms of colorectal cancer. Talk to your caregiver about this at age 69, when routine screening begins. Direct examination of the colon should be repeated every 5 to 10 years through age 34, unless early forms of pre-cancerous polyps or small growths are found.   Hepatitis C blood testing is recommended for all people born from [birth year] through 1965 and any individual with known risks for hepatitis C.   Practice safe sex. Use condoms and avoid high-risk sexual practices to reduce  the spread of sexually transmitted infections (STIs). STIs include gonorrhea, chlamydia, syphilis, trichomonas, herpes, HPV, and human immunodeficiency virus (HIV). Herpes, HIV, and HPV are viral illnesses that have no cure. They can result in disability, cancer, and death. Sexually active women aged 62 and younger should be checked for chlamydia. Older women with new or multiple partners should also be tested for chlamydia. Testing for other STIs is recommended if you are sexually active and at increased risk.   Osteoporosis is a disease in which the bones lose minerals and strength with aging. This can result in serious bone fractures. The risk of osteoporosis can be identified using a bone density scan. Women ages 91 and over and women at risk for fractures or osteoporosis should discuss screening with their caregivers. Ask your caregiver whether you should take a calcium supplement or vitamin D to reduce the rate of osteoporosis.   Menopause can be associated with physical symptoms and risks. Hormone replacement therapy is available to decrease symptoms and risks. You should talk to your caregiver about whether hormone replacement therapy is right for you.   Use sunscreen with sun protection factor (SPF) of 30 or more. Apply sunscreen liberally and repeatedly throughout the day. You should seek shade when your shadow is shorter than you. Protect yourself by wearing long sleeves, pants, a wide-brimmed hat, and sunglasses year round, whenever you are outdoors.   Once a month, do a whole body skin exam, using a mirror to look at the skin on your back. Notify your caregiver of new moles, moles that have irregular borders, moles that  are larger than a pencil eraser, or moles that have changed in shape or color.   Stay current with required immunizations.   Influenza. You need a dose every fall (or winter). The composition of the flu vaccine changes each year, so being vaccinated once is not enough.    Pneumococcal polysaccharide. You need 1 to 2 doses if you smoke cigarettes or if you have certain chronic medical conditions. You need 1 dose at age 40 (or older) if you have never been vaccinated.   Tetanus, diphtheria, pertussis (Tdap, Td). Get 1 dose of Tdap vaccine if you are younger than age 17, are over 37 and have contact with an infant, are a Research scientist (physical sciences), are pregnant, or simply want to be protected from whooping cough. After that, you need a Td booster dose every 10 years. Consult your caregiver if you have not had at least 3 tetanus and diphtheria-containing shots sometime in your life or have a deep or dirty wound.   HPV. You need this vaccine if you are a woman age 25 or younger. The vaccine is given in 3 doses over 6 months.   Measles, mumps, rubella (MMR). You need at least 1 dose of MMR if you were born in 1957 or later. You may also need a second dose.   Meningococcal. If you are age 4 to 69 and a first-year college student living in a residence hall, or have one of several medical conditions, you need to get vaccinated against meningococcal disease. You may also need additional booster doses.   Zoster (shingles). If you are age 39 or older, you should get this vaccine.   Varicella (chickenpox). If you have never had chickenpox or you were vaccinated but received only 1 dose, talk to your caregiver to find out if you need this vaccine.   Hepatitis A. You need this vaccine if you have a specific risk factor for hepatitis A virus infection or you simply wish to be protected from this disease. The vaccine is usually given as 2 doses, 6 to 18 months apart.   Hepatitis B. You need this vaccine if you have a specific risk factor for hepatitis B virus infection or you simply wish to be protected from this disease. The vaccine is given in 3 doses, usually over 6 months.  Preventive Services / Frequency Ages 52 to 25  Blood pressure check.** / Every 1 to 2 years.   Lipid and  cholesterol check.** / Every 5 years beginning at age 21.   Clinical breast exam.** / Every 3 years for women in their 57s and 30s.   Pap test.** / Every 2 years from ages 56 through 34. Every 3 years starting at age 62 through age 1 or 43 with a history of 3 consecutive normal Pap tests.   HPV screening.** / Every 3 years from ages 55 through ages 76 to 39 with a history of 3 consecutive normal Pap tests.   Hepatitis C blood test.** / For any individual with known risks for hepatitis C.   Skin self-exam. / Monthly.   Influenza immunization.** / Every year.   Pneumococcal polysaccharide immunization.** / 1 to 2 doses if you smoke cigarettes or if you have certain chronic medical conditions.   Tetanus, diphtheria, pertussis (Tdap, Td) immunization. / A one-time dose of Tdap vaccine. After that, you need a Td booster dose every 10 years.   HPV immunization. / 3 doses over 6 months, if you are 46 and younger.  Measles, mumps, rubella (MMR) immunization. / You need at least 1 dose of MMR if you were born in 1957 or later. You may also need a second dose.   Meningococcal immunization. / 1 dose if you are age 15 to 69 and a first-year college student living in a residence hall, or have one of several medical conditions, you need to get vaccinated against meningococcal disease. You may also need additional booster doses.   Varicella immunization.** / Consult your caregiver.   Hepatitis A immunization.** / Consult your caregiver. 2 doses, 6 to 18 months apart.   Hepatitis B immunization.** / Consult your caregiver. 3 doses usually over 6 months.  Ages 86 to 40  Blood pressure check.** / Every 1 to 2 years.   Lipid and cholesterol check.** / Every 5 years beginning at age 71.   Clinical breast exam.** / Every year after age 37.   Mammogram.** / Every year beginning at age 58 and continuing for as long as you are in good health. Consult with your caregiver.   Pap test.** / Every 3  years starting at age 12 through age 33 or 56 with a history of 3 consecutive normal Pap tests.   HPV screening.** / Every 3 years from ages 93 through ages 53 to 37 with a history of 3 consecutive normal Pap tests.   Fecal occult blood test (FOBT) of stool. / Every year beginning at age 75 and continuing until age 23. You may not need to do this test if you get a colonoscopy every 10 years.   Flexible sigmoidoscopy or colonoscopy.** / Every 5 years for a flexible sigmoidoscopy or every 10 years for a colonoscopy beginning at age 54 and continuing until age 24.   Hepatitis C blood test.** / For all people born from [birth year] through 1965 and any individual with known risks for hepatitis C.   Skin self-exam. / Monthly.   Influenza immunization.** / Every year.   Pneumococcal polysaccharide immunization.** / 1 to 2 doses if you smoke cigarettes or if you have certain chronic medical conditions.   Tetanus, diphtheria, pertussis (Tdap, Td) immunization.** / A one-time dose of Tdap vaccine. After that, you need a Td booster dose every 10 years.   Measles, mumps, rubella (MMR) immunization. / You need at least 1 dose of MMR if you were born in 1957 or later. You may also need a second dose.   Varicella immunization.** / Consult your caregiver.   Meningococcal immunization.** / Consult your caregiver.   Hepatitis A immunization.** / Consult your caregiver. 2 doses, 6 to 18 months apart.   Hepatitis B immunization.** / Consult your caregiver. 3 doses, usually over 6 months.  Ages 76 and over  Blood pressure check.** / Every 1 to 2 years.   Lipid and cholesterol check.** / Every 5 years beginning at age 27.   Clinical breast exam.** / Every year after age 54.   Mammogram.** / Every year beginning at age 22 and continuing for as long as you are in good health. Consult with your caregiver.   Pap test.** / Every 3 years starting at age 31 through age 22 or 85 with a 3 consecutive normal Pap  tests. Testing can be stopped between 65 and 70 with 3 consecutive normal Pap tests and no abnormal Pap or HPV tests in the past 10 years.   HPV screening.** / Every 3 years from ages 40 through ages 36 or 9 with a history of 3 consecutive normal  Pap tests. Testing can be stopped between 65 and 70 with 3 consecutive normal Pap tests and no abnormal Pap or HPV tests in the past 10 years.   Fecal occult blood test (FOBT) of stool. / Every year beginning at age 42 and continuing until age 60. You may not need to do this test if you get a colonoscopy every 10 years.   Flexible sigmoidoscopy or colonoscopy.** / Every 5 years for a flexible sigmoidoscopy or every 10 years for a colonoscopy beginning at age 70 and continuing until age 55.   Hepatitis C blood test.** / For all people born from [birth year] through 1964 and any individual with known risks for hepatitis C.   Osteoporosis screening.** / A one-time screening for women ages 42 and over and women at risk for fractures or osteoporosis.   Skin self-exam. / Monthly.   Influenza immunization.** / Every year.   Pneumococcal polysaccharide immunization.** / 1 dose at age 45 (or older) if you have never been vaccinated.   Tetanus, diphtheria, pertussis (Tdap, Td) immunization. / A one-time dose of Tdap vaccine if you are over 65 and have contact with an infant, are a Research scientist (physical sciences), or simply want to be protected from whooping cough. After that, you need a Td booster dose every 10 years.   Varicella immunization.** / Consult your caregiver.   Meningococcal immunization.** / Consult your caregiver.   Hepatitis A immunization.** / Consult your caregiver. 2 doses, 6 to 18 months apart.   Hepatitis B immunization.** / Check with your caregiver. 3 doses, usually over 6 months.  ** Family history and personal history of risk and conditions may change your caregiver's recommendations. Document Released: 05/24/2001 Document Revised: 03/17/2011 Document  Reviewed: 08/23/2010 [facility] Patient Information 2012 [place], [place].

## 2011-09-06 NOTE — Assessment & Plan Note (Signed)
Continue to use topical and anti-itch prn.
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
## 2011-09-08 ENCOUNTER — Encounter: Payer: Self-pay | Admitting: Family Medicine

## 2011-10-05 ENCOUNTER — Ambulatory Visit: Payer: Self-pay | Admitting: Family Medicine

## 2011-10-10 ENCOUNTER — Ambulatory Visit (INDEPENDENT_AMBULATORY_CARE_PROVIDER_SITE_OTHER): Payer: Self-pay | Admitting: Family Medicine

## 2011-10-10 ENCOUNTER — Encounter: Payer: Self-pay | Admitting: Family Medicine

## 2011-10-10 VITALS — BP 145/89 | HR 97 | Temp 98.2°F | Ht 61.0 in | Wt 124.0 lb

## 2011-10-10 DIAGNOSIS — M545 Low back pain, unspecified: Secondary | ICD-10-CM

## 2011-10-10 DIAGNOSIS — I10 Essential (primary) hypertension: Secondary | ICD-10-CM

## 2011-10-10 DIAGNOSIS — E7800 Pure hypercholesterolemia, unspecified: Secondary | ICD-10-CM

## 2011-10-10 DIAGNOSIS — Z79899 Other long term (current) drug therapy: Secondary | ICD-10-CM

## 2011-10-10 DIAGNOSIS — M549 Dorsalgia, unspecified: Secondary | ICD-10-CM

## 2011-10-10 DIAGNOSIS — R21 Rash and other nonspecific skin eruption: Secondary | ICD-10-CM

## 2011-10-10 MED ORDER — ATORVASTATIN CALCIUM 20 MG PO TABS: 20.0000 mg | ORAL_TABLET | Freq: Every day | ORAL | Status: DC
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
## 2011-10-10 NOTE — Assessment & Plan Note (Signed)
Dermatology referral 

## 2011-10-10 NOTE — Patient Instructions (Addendum)
Hypercholesterolemia High Blood Cholesterol Cholesterol is a white, waxy, fat-like protein needed by your body in small amounts. The liver makes all the cholesterol you need. It is carried from the liver by the blood through the blood vessels. Deposits (plaque) may build up on blood vessel walls. This makes the arteries narrower and stiffer. Plaque increases the risk for heart attack and stroke. You cannot feel your cholesterol level even if it is very high. The only way to know is by a blood test to check your lipid (fats) levels. Once you know your cholesterol levels, you should keep a record of the test results. Work with your caregiver to to keep your levels in the desired range. WHAT THE RESULTS MEAN:  Total cholesterol is a rough measure of all the cholesterol in your blood.   LDL is the so-called bad cholesterol. This is the type that deposits cholesterol in the walls of the arteries. You want this level to be low.   HDL is the good cholesterol because it cleans the arteries and carries the LDL away. You want this level to be high.   Triglycerides are fat that the body can either burn for energy or store. High levels are closely linked to heart disease.  DESIRED LEVELS:  Total cholesterol below 200.   LDL below 100 for people at risk, below 70 for very high risk.   HDL above 50 is good, above 60 is best.   Triglycerides below 150.  HOW TO LOWER YOUR CHOLESTEROL:  Diet.   Choose fish or white meat chicken and [place], roasted or baked. Limit fatty cuts of red meat, fried foods, and processed meats, such as sausage and lunch meat.   Eat lots of fresh fruits and vegetables. Choose whole grains, beans, pasta, potatoes and cereals.   Use only small amounts of olive, corn or canola oils. Avoid butter, mayonnaise, shortening or palm kernel oils. Avoid foods with trans-fats.   Use skim/nonfat milk and low-fat/nonfat yogurt and cheeses. Avoid whole milk, cream, ice cream, egg yolks and  cheeses. Healthy desserts include angel food cake, gingersnaps, animal crackers, hard candy, popsicles, and low-fat/nonfat frozen yogurt. Avoid pastries, cakes, pies and cookies.   Exercise.   A regular program helps decrease LDL and raises HDL.   Helps with weight control.   Do things that increase your activity level like gardening, walking, or taking the stairs.   Medication.   May be prescribed by your caregiver to help lowering cholesterol and the risk for heart disease.   You may need medicine even if your levels are normal if you have several risk factors.  HOME CARE INSTRUCTIONS   Follow your diet and exercise programs as suggested by your caregiver.   Take medications as directed.   Have blood work done when your caregiver feels it is necessary.  MAKE SURE YOU:   Understand these instructions.   Will watch your condition.   Will get help right away if you are not doing well or get worse.  Document Released: 03/28/2005 Document Revised: 03/17/2011 Document Reviewed: 09/13/2006 [facility] Patient Information 2012 [place], [place]. Magnetic Resonance Imaging Your doctor wants you to have an magnetic resonance imaging (MRI) scan. MRI uses magnetism and radio waves to make pictures of the inside of your body. It does not use X-rays or injectable dyes and is therefore safe and painless. Because MRI uses a powerful magnetic field, it cannot be done if you have a heart pacemaker, inner ear implant, brain aneurysm clips, shrapnel,  IUD, or other metal implants.  MRI studies are often used to evaluate problems with the nervous system, spine, and joints. An MRI scan usually takes 30 to 60 minutes to complete. You will be placed on a narrow moveable table and slid inside the scanner. During the scan you may notice some loud noises, this is normal. A number of images will be recorded and analyzed by a computer. These images will be reviewed by a radiology specialist, and a report will be  sent to your doctor. Document Released: 05/05/2004 Document Revised: 03/17/2011 Document Reviewed: 03/20/2008 [facility] Patient Information 2012 [place], [place]. Rash A rash is a change in the color or texture of your skin. There are many different types of rashes. You may have other problems that accompany your rash. CAUSES   Infections.   Allergic reactions. This can include allergies to pets or foods.   Certain medicines.   Exposure to certain chemicals, soaps, or cosmetics.   Heat.   Exposure to poisonous plants.   Tumors, both cancerous and noncancerous.  SYMPTOMS   Redness.   Scaly skin.   Itchy skin.   Dry or cracked skin.   Bumps.   Blisters.   Pain.  DIAGNOSIS  Your caregiver may do a physical exam to determine what type of rash you have. A skin sample (biopsy) may be taken and examined under a microscope. TREATMENT  Treatment depends on the type of rash you have. Your caregiver may prescribe certain medicines. For serious conditions, you may need to see a skin doctor (dermatologist). HOME CARE INSTRUCTIONS   Avoid the substance that caused your rash.   Do not scratch your rash. This can cause infection.   You may take cool baths to help stop itching.   Only take over-the-counter or prescription medicines as directed by your caregiver.   Keep all follow-up appointments as directed by your caregiver.  SEEK IMMEDIATE MEDICAL CARE IF:  You have increasing pain, swelling, or redness.   You have a fever.   You have new or severe symptoms.   You have body aches, diarrhea, or vomiting.   Your rash is not better after 3 days.  MAKE SURE YOU:  Understand these instructions.   Will watch your condition.   Will get help right away if you are not doing well or get worse.  Document Released: 03/18/2002 Document Revised: 03/17/2011 Document Reviewed: 01/10/2011 [facility] Patient Information 2012 [place], [place].

## 2011-10-10 NOTE — Assessment & Plan Note (Signed)
Continued HCTZ may need to add additional agents, once her disability insurance comes through.

## 2011-10-10 NOTE — Assessment & Plan Note (Signed)
Start atorvastatin. Follow LFTs in 1 month

## 2011-10-10 NOTE — Assessment & Plan Note (Signed)
Check MRI cervical spine

## 2011-10-12 ENCOUNTER — Ambulatory Visit (HOSPITAL_COMMUNITY): Admission: RE | Admit: 2011-10-12 | Payer: Self-pay | Source: Ambulatory Visit

## 2011-10-17 ENCOUNTER — Ambulatory Visit (HOSPITAL_COMMUNITY)
Admission: RE | Admit: 2011-10-17 | Discharge: 2011-10-17 | Disposition: A | Discharge status: Home / Self Care | Payer: Self-pay | Source: Ambulatory Visit | Attending: Family Medicine | Admitting: Family Medicine

## 2011-10-17 ENCOUNTER — Telehealth: Payer: Self-pay | Admitting: Family Medicine

## 2011-10-17 DIAGNOSIS — M549 Dorsalgia, unspecified: Secondary | ICD-10-CM

## 2011-10-17 DIAGNOSIS — M545 Low back pain, unspecified: Secondary | ICD-10-CM

## 2011-10-17 DIAGNOSIS — M47812 Spondylosis without myelopathy or radiculopathy, cervical region: Secondary | ICD-10-CM | POA: Insufficient documentation

## 2011-10-17 DIAGNOSIS — R209 Unspecified disturbances of skin sensation: Secondary | ICD-10-CM | POA: Insufficient documentation

## 2011-10-17 NOTE — Telephone Encounter (Signed)
Wasn't able to complete MRI - had a panic attack - needs to reschedule and have something to help her relax - needs this asap because of disability hearing  Also needs to know about derm appt.

## 2011-10-17 NOTE — Addendum Note (Signed)
Addended by: Garen Grams F on: 10/17/2011 10:08 AM   Modules accepted: Orders

## 2011-10-18 MED ORDER — ALPRAZOLAM 1 MG PO TABS: 1.0000 mg | ORAL_TABLET | Freq: Once | ORAL | Status: AC

## 2011-10-18 NOTE — Telephone Encounter (Signed)
Looks like she has some neck impingement on the images they got.  Can call in some xanax for her.    Asha or Jessica--derm referral?

## 2011-10-18 NOTE — Telephone Encounter (Signed)
Re-Entered MRI order, called patient and informed her of MRI scheduled for 10/21/11 at 10am, also told her that I called in her Xanax to the Outpatient pharmacy and not to take it until the tech tells her when she goes in for MRI. Patient expressed understanding.

## 2011-10-18 NOTE — Addendum Note (Signed)
Addended by: Garen Grams F on: 10/18/2011 11:42 AM   Modules accepted: Orders

## 2011-10-18 NOTE — Telephone Encounter (Signed)
Do we need a new MRI order or will the old one do since she did not finish it?

## 2011-10-18 NOTE — Addendum Note (Signed)
Addended by: Reva Bores on: 10/18/2011 10:08 AM   Modules accepted: Orders

## 2011-10-19 ENCOUNTER — Telehealth: Payer: Self-pay | Admitting: *Deleted

## 2011-10-19 MED ORDER — SIMVASTATIN 20 MG PO TABS: 20.0000 mg | ORAL_TABLET | Freq: Every evening | ORAL | Status: DC

## 2011-10-19 NOTE — Telephone Encounter (Signed)
Received fax from [facility]  asking if RX for atorvastatin  ( they do not have ) can be switched to either simvastatin or pravastatin.  Will forward message to Dr.Pratt.

## 2011-10-19 NOTE — Telephone Encounter (Signed)
Rx called pharmacy  

## 2011-10-21 ENCOUNTER — Ambulatory Visit (HOSPITAL_COMMUNITY)
Admission: RE | Admit: 2011-10-21 | Discharge: 2011-10-21 | Disposition: A | Discharge status: Home / Self Care | Payer: Medicaid Other | Source: Ambulatory Visit | Attending: Family Medicine | Admitting: Family Medicine

## 2011-10-21 DIAGNOSIS — M4802 Spinal stenosis, cervical region: Secondary | ICD-10-CM | POA: Insufficient documentation

## 2011-10-21 DIAGNOSIS — M549 Dorsalgia, unspecified: Secondary | ICD-10-CM | POA: Insufficient documentation

## 2011-10-21 DIAGNOSIS — M79609 Pain in unspecified limb: Secondary | ICD-10-CM | POA: Insufficient documentation

## 2011-10-24 ENCOUNTER — Telehealth: Payer: Self-pay | Admitting: *Deleted

## 2011-10-24 NOTE — Telephone Encounter (Signed)
LMOVM for pt to call back.  If comfortable, please let her know about arthritis and ask if she needs a copy. Fleeger, Jody Mcclure
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
## 2011-10-25 NOTE — Telephone Encounter (Signed)
I can let pt know about the Derm referral with [facility] on 8/9, but will forward message to MD about other questions. Eliezer Khawaja, Maryjo Rochester

## 2011-10-25 NOTE — Telephone Encounter (Signed)
LMOVM for her to call back.  If comfortable please inform of the below when she calls back. Jody Mcclure, Jody Mcclure

## 2011-11-04 ENCOUNTER — Ambulatory Visit: Payer: Self-pay | Admitting: Family Medicine

## 2011-11-10 ENCOUNTER — Other Ambulatory Visit: Payer: Self-pay

## 2011-11-15 ENCOUNTER — Other Ambulatory Visit: Payer: Self-pay

## 2011-11-15 DIAGNOSIS — Z79899 Other long term (current) drug therapy: Secondary | ICD-10-CM

## 2011-11-15 LAB — COMPREHENSIVE METABOLIC PANEL
ALT: 16 U/L (ref 0–35)
AST: 21 U/L (ref 0–37)
Albumin: 4.3 g/dL (ref 3.5–5.2)
Alkaline Phosphatase: 62 U/L (ref 39–117)
BUN: 6 mg/dL (ref 6–23)
CO2: 33 mEq/L — ABNORMAL HIGH (ref 19–32)
Calcium: 9.9 mg/dL (ref 8.4–10.5)
Chloride: 97 mEq/L (ref 96–112)
Creat: 0.7 mg/dL (ref 0.50–1.10)
Glucose, Bld: 92 mg/dL (ref 70–99)
Potassium: 3.6 mEq/L (ref 3.5–5.3)
Sodium: 139 mEq/L (ref 135–145)
Total Bilirubin: 0.8 mg/dL (ref 0.3–1.2)
Total Protein: 7 g/dL (ref 6.0–8.3)

## 2011-11-15 NOTE — Progress Notes (Signed)
CMP DONE TODAY Jody Mcclure 

## 2011-12-13 ENCOUNTER — Ambulatory Visit (INDEPENDENT_AMBULATORY_CARE_PROVIDER_SITE_OTHER): Payer: Self-pay | Admitting: Family Medicine

## 2011-12-13 ENCOUNTER — Encounter: Payer: Self-pay | Admitting: Family Medicine

## 2011-12-13 VITALS — BP 164/110 | HR 89 | Temp 98.3°F | Ht 61.0 in | Wt 128.6 lb

## 2011-12-13 DIAGNOSIS — R21 Rash and other nonspecific skin eruption: Secondary | ICD-10-CM

## 2011-12-13 DIAGNOSIS — R519 Headache, unspecified: Secondary | ICD-10-CM | POA: Insufficient documentation

## 2011-12-13 DIAGNOSIS — Z1211 Encounter for screening for malignant neoplasm of colon: Secondary | ICD-10-CM

## 2011-12-13 DIAGNOSIS — I10 Essential (primary) hypertension: Secondary | ICD-10-CM

## 2011-12-13 DIAGNOSIS — M542 Cervicalgia: Secondary | ICD-10-CM

## 2011-12-13 DIAGNOSIS — R51 Headache: Secondary | ICD-10-CM

## 2011-12-13 MED ORDER — LISINOPRIL 10 MG PO TABS: 10.0000 mg | ORAL_TABLET | Freq: Every day | ORAL | Status: DC
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
## 2011-12-13 NOTE — Assessment & Plan Note (Signed)
To see dermatologist this week.

## 2011-12-13 NOTE — Assessment & Plan Note (Signed)
Add ACE Inhibitor

## 2011-12-13 NOTE — Patient Instructions (Addendum)
Hypercholesterolemia High Blood Cholesterol Cholesterol is a white, waxy, fat-like protein needed by your body in small amounts. The liver makes all the cholesterol you need. It is carried from the liver by the blood through the blood vessels. Deposits (plaque) may build up on blood vessel walls. This makes the arteries narrower and stiffer. Plaque increases the risk for heart attack and stroke. You cannot feel your cholesterol level even if it is very high. The only way to know is by a blood test to check your lipid (fats) levels. Once you know your cholesterol levels, you should keep a record of the test results. Work with your caregiver to to keep your levels in the desired range. WHAT THE RESULTS MEAN:  Total cholesterol is a rough measure of all the cholesterol in your blood.   LDL is the so-called bad cholesterol. This is the type that deposits cholesterol in the walls of the arteries. You want this level to be low.   HDL is the good cholesterol because it cleans the arteries and carries the LDL away. You want this level to be high.   Triglycerides are fat that the body can either burn for energy or store. High levels are closely linked to heart disease.  DESIRED LEVELS:  Total cholesterol below 200.   LDL below 100 for people at risk, below 70 for very high risk.   HDL above 50 is good, above 60 is best.   Triglycerides below 150.  HOW TO LOWER YOUR CHOLESTEROL:  Diet.   Choose fish or white meat chicken and turkey, roasted or baked. Limit fatty cuts of red meat, fried foods, and processed meats, such as sausage and lunch meat.   Eat lots of fresh fruits and vegetables. Choose whole grains, beans, pasta, potatoes and cereals.   Use only small amounts of olive, corn or canola oils. Avoid butter, mayonnaise, shortening or palm kernel oils. Avoid foods with trans-fats.   Use skim/nonfat milk and low-fat/nonfat yogurt and cheeses. Avoid whole milk, cream, ice cream, egg yolks and  cheeses. Healthy desserts include angel food cake, gingersnaps, animal crackers, hard candy, popsicles, and low-fat/nonfat frozen yogurt. Avoid pastries, cakes, pies and cookies.   Exercise.   A regular program helps decrease LDL and raises HDL.   Helps with weight control.   Do things that increase your activity level like gardening, walking, or taking the stairs.   Medication.   May be prescribed by your caregiver to help lowering cholesterol and the risk for heart disease.   You may need medicine even if your levels are normal if you have several risk factors.  HOME CARE INSTRUCTIONS   Follow your diet and exercise programs as suggested by your caregiver.   Take medications as directed.   Have blood work done when your caregiver feels it is necessary.  MAKE SURE YOU:   Understand these instructions.   Will watch your condition.   Will get help right away if you are not doing well or get worse.  Document Released: 03/28/2005 Document Revised: 03/17/2011 Document Reviewed: 09/13/2006 [employer] Patient Information 2012 [employer].Hypertension As your heart beats, it forces blood through your arteries. This force is your blood pressure. If the pressure is too high, it is called hypertension (HTN) or high blood pressure. HTN is dangerous because you may have it and not know it. High blood pressure may mean that your heart has to work harder to pump blood. Your arteries may be narrow or stiff. The extra work   puts you at risk for heart disease, stroke, and other problems.  Blood pressure consists of two numbers, a higher number over a lower, 110/72, for example. It is stated as "110 over 72." The ideal is below 120 for the top number (systolic) and under 80 for the bottom (diastolic). Write down your blood pressure today. You should pay close attention to your blood pressure if you have certain conditions such as:  Heart failure.   Prior heart attack.   Diabetes   Chronic  kidney disease.   Prior stroke.   Multiple risk factors for heart disease.  To see if you have HTN, your blood pressure should be measured while you are seated with your arm held at the level of the heart. It should be measured at least twice. A one-time elevated blood pressure reading (especially in the Emergency Department) does not mean that you need treatment. There may be conditions in which the blood pressure is different between your right and left arms. It is important to see your caregiver soon for a recheck. Most people have essential hypertension which means that there is not a specific cause. This type of high blood pressure may be lowered by changing lifestyle factors such as:  Stress.   Smoking.   Lack of exercise.   Excessive weight.   Drug/tobacco/alcohol use.   Eating less salt.  Most people do not have symptoms from high blood pressure until it has caused damage to the body. Effective treatment can often prevent, delay or reduce that damage. TREATMENT  When a cause has been identified, treatment for high blood pressure is directed at the cause. There are a large number of medications to treat HTN. These fall into several categories, and your caregiver will help you select the medicines that are best for you. Medications may have side effects. You should review side effects with your caregiver. If your blood pressure stays high after you have made lifestyle changes or started on medicines,   Your medication(s) may need to be changed.   Other problems may need to be addressed.   Be certain you understand your prescriptions, and know how and when to take your medicine.   Be sure to follow up with your caregiver within the time frame advised (usually within two weeks) to have your blood pressure rechecked and to review your medications.   If you are taking more than one medicine to lower your blood pressure, make sure you know how and at what times they should be taken.  Taking two medicines at the same time can result in blood pressure that is too low.  SEEK IMMEDIATE MEDICAL CARE IF:  You develop a severe headache, blurred or changing vision, or confusion.   You have unusual weakness or numbness, or a faint feeling.   You have severe chest or abdominal pain, vomiting, or breathing problems.  MAKE SURE YOU:   Understand these instructions.   Will watch your condition.   Will get help right away if you are not doing well or get worse.  Document Released: 03/28/2005 Document Revised: 03/17/2011 Document Reviewed: 11/16/2007 [facility] Patient Information 2012 [place], [place].Headache Headaches are caused by many different problems. Most commonly, headache is caused by muscle tension from an injury, fatigue, or emotional upset. Excessive muscle contractions in the scalp and neck result in a headache that often feels like a tight band around the head. Tension headaches often have areas of tenderness over the scalp and the back of the neck. These  headaches may last for hours, days, or longer, and some may contribute to migraines in those who have migraine problems. Migraines usually cause a throbbing headache, which is made worse by activity. Sometimes only one side of the head hurts. Nausea, vomiting, eye pain, and avoidance of food are common with migraines. Visual symptoms such as light sensitivity, blind spots, or flashing lights may also occur. Loud noises may worsen migraine headaches. Many factors may cause migraine headaches:  Emotional stress, lack of sleep, and menstrual periods.   Alcohol and some drugs (such as birth control pills).   Diet factors (fasting, caffeine, food preservatives, chocolate).   Environmental factors (weather changes, bright lights, odors, smoke).  Other causes of headaches include minor injuries to the head. Arthritis in the neck; problems with the jaw, eyes, ears, or nose are also causes of headaches. Allergies, drugs,  alcohol, and exposure to smoke can also cause moderate headaches. Rebound headaches can occur if someone uses pain medications for a long period of time and then stops. Less commonly, blood vessel problems in the neck and brain (including stroke) can cause various types of headache. Treatment of headaches includes medicines for pain and relaxation. Ice packs or heat applied to the back of the head and neck help some people. Massaging the shoulders, neck and scalp are often very useful. Relaxation techniques and stretching can help prevent these headaches. Avoid alcohol and cigarette smoking as these tend to make headaches worse. Please see your caregiver if your headache is not better in 2 days.  SEEK IMMEDIATE MEDICAL CARE IF:   You develop a high fever, chills, or repeated vomiting.   You faint or have difficulty with vision.   You develop unusual numbness or weakness of your arms or legs.   Relief of pain is inadequate with medication, or you develop severe pain.   You develop confusion, or neck stiffness.   You have a worsening of a headache or do not obtain relief.  Document Released: 03/28/2005 Document Revised: 03/17/2011 Document Reviewed: 09/21/2006 [facility] Patient Information 2012 [place], [place].Cervical Radiculopathy Cervical radiculopathy happens when a nerve in the neck is pinched or bruised by a slipped (herniated) disk or by arthritic changes in the bones of the cervical spine. This can occur due to an injury or as part of the normal aging process. Pressure on the cervical nerves can cause pain or numbness that runs from your neck all the way down into your arm and fingers. CAUSES  There are many possible causes, including:  Injury.   Muscle tightness in the neck from overuse.   Swollen, painful joints (arthritis).   Breakdown or degeneration in the bones and joints of the spine (spondylosis) due to aging.   Bone spurs that may develop near the cervical nerves.    SYMPTOMS  Symptoms include pain, weakness, or numbness in the affected arm and hand. Pain can be severe or irritating. Symptoms may be worse when extending or turning the neck. DIAGNOSIS  Your caregiver will ask about your symptoms and do a physical exam. He or she may test your strength and reflexes. X-rays, CT scans, and MRI scans may be needed in cases of injury or if the symptoms do not go away after a period of time. Electromyography (EMG) or nerve conduction testing may be done to study how your nerves and muscles are working. TREATMENT  Your caregiver may recommend certain exercises to help relieve your symptoms. Cervical radiculopathy can, and often does, get better with time and  treatment. If your problems continue, treatment options may include:  Wearing a soft collar for short periods of time.   Physical therapy to strengthen the neck muscles.   Medicines, such as nonsteroidal anti-inflammatory drugs (NSAIDs), oral corticosteroids, or spinal injections.   Surgery. Different types of surgery may be done depending on the cause of your problems.  HOME CARE INSTRUCTIONS   Put ice on the affected area.   Put ice in a plastic bag.   Place a towel between your skin and the bag.   Leave the ice on for 15 to 20 minutes, 3 to 4 times a day or as directed by your caregiver.   Use a flat pillow when you sleep.   Only take over-the-counter or prescription medicines for pain, discomfort, or fever as directed by your caregiver.   If physical therapy was prescribed, follow your caregiver's directions.   If a soft collar was prescribed, use it as directed.  SEEK IMMEDIATE MEDICAL CARE IF:   Your pain gets much worse and cannot be controlled with medicines.   You have weakness or numbness in your hand, arm, face, or leg.   You have a high fever or a stiff, rigid neck.   You lose bowel or bladder control (incontinence).   You have trouble with walking, balance, or speaking.  MAKE  SURE YOU:   Understand these instructions.   Will watch your condition.   Will get help right away if you are not doing well or get worse.  Document Released: 12/21/2000 Document Revised: 03/17/2011 Document Reviewed: 11/09/2010 [facility] Patient Information 2012 [place], [place].

## 2011-12-13 NOTE — Addendum Note (Signed)
Addended by: Jone Baseman D on: 12/13/2011 11:20 AM   Modules accepted: Orders

## 2011-12-26 ENCOUNTER — Ambulatory Visit: Payer: Medicaid Other | Attending: Family Medicine | Admitting: Physical Therapy

## 2011-12-26 DIAGNOSIS — M6281 Muscle weakness (generalized): Secondary | ICD-10-CM | POA: Insufficient documentation

## 2011-12-26 DIAGNOSIS — M2550 Pain in unspecified joint: Secondary | ICD-10-CM | POA: Insufficient documentation

## 2011-12-26 DIAGNOSIS — IMO0001 Reserved for inherently not codable concepts without codable children: Secondary | ICD-10-CM | POA: Insufficient documentation

## 2011-12-26 DIAGNOSIS — R293 Abnormal posture: Secondary | ICD-10-CM | POA: Insufficient documentation

## 2011-12-26 DIAGNOSIS — R5381 Other malaise: Secondary | ICD-10-CM | POA: Insufficient documentation

## 2012-01-03 ENCOUNTER — Ambulatory Visit: Payer: Medicaid Other | Admitting: Rehabilitation

## 2012-01-05 ENCOUNTER — Encounter: Payer: Self-pay | Admitting: Physical Therapy

## 2012-01-10 ENCOUNTER — Encounter: Payer: Self-pay | Admitting: Rehabilitation

## 2012-01-12 ENCOUNTER — Encounter: Payer: Self-pay | Admitting: Rehabilitation

## 2012-01-17 ENCOUNTER — Encounter: Payer: Self-pay | Admitting: Physical Therapy

## 2012-01-19 ENCOUNTER — Encounter: Payer: Self-pay | Admitting: Physical Therapy

## 2012-03-15 ENCOUNTER — Encounter (HOSPITAL_COMMUNITY): Payer: Self-pay | Admitting: *Deleted

## 2012-03-15 ENCOUNTER — Emergency Department (HOSPITAL_COMMUNITY)
Admission: EM | Admit: 2012-03-15 | Discharge: 2012-03-15 | Disposition: A | Discharge status: Home / Self Care | Payer: Medicaid Other | Source: Home / Self Care | Attending: Emergency Medicine | Admitting: Emergency Medicine

## 2012-03-15 DIAGNOSIS — J40 Bronchitis, not specified as acute or chronic: Secondary | ICD-10-CM

## 2012-03-15 MED ORDER — ALBUTEROL SULFATE (5 MG/ML) 0.5% IN NEBU
INHALATION_SOLUTION | RESPIRATORY_TRACT | Status: AC
  Filled 2012-03-15: qty 1

## 2012-03-15 MED ORDER — AZITHROMYCIN 250 MG PO TABS: 250.0000 mg | ORAL_TABLET | Freq: Every day | ORAL | Status: DC

## 2012-03-15 MED ORDER — PREDNISONE 20 MG PO TABS: 40.0000 mg | ORAL_TABLET | Freq: Every day | ORAL | Status: DC

## 2012-03-15 MED ORDER — ALBUTEROL SULFATE HFA 108 (90 BASE) MCG/ACT IN AERS: 1.0000 | INHALATION_SPRAY | Freq: Four times a day (QID) | RESPIRATORY_TRACT | Status: DC | PRN

## 2012-03-15 MED ORDER — ALBUTEROL SULFATE (5 MG/ML) 0.5% IN NEBU
5.0000 mg | INHALATION_SOLUTION | Freq: Once | RESPIRATORY_TRACT | Status: AC
  Administered 2012-03-15: 5 mg via RESPIRATORY_TRACT

## 2012-03-15 NOTE — ED Provider Notes (Signed)
History     CSN: [id number]  Arrival date & time 03/15/12  1340   First MD Initiated Contact with Patient 03/15/12 1359      Chief Complaint  Patient presents with  . Cough    (Consider location/radiation/quality/duration/timing/severity/associated sxs/prior treatment) HPI Comments: Patient presents urgent care describing for the last 2-3 weeks she's been coughing having sinus congestion and a cold and doesn't seem to get rid of it. She has tried some over-the-counter decongestants but at this point feels she's also wheezing and coughing up phlegm and feels her chest congestion. Feels like her symptoms are much worse at night especially wheezing. Denies any fevers within the last few days. Patient is not a smoker and denies having lung problems.  Patient is a 56 y.o. female presenting with cough. The history is provided by the patient.  Cough This is a recurrent problem. The current episode started more than 1 week ago. The problem occurs constantly. The problem has been gradually worsening. The cough is productive of brown sputum. Associated symptoms include chills, ear congestion, rhinorrhea and wheezing. Pertinent negatives include no ear pain and no myalgias. She has tried decongestants for the symptoms. The treatment provided no relief.    Past Medical History  Diagnosis Date  . Hyperlipidemia     Past Surgical History  Procedure Date  . Abdominal hysterectomy   . Tonsillectomy     Family History  Problem Relation Age of Onset  . Cancer Mother     lung  . Hyperlipidemia Mother   . Hypertension Father   . Hyperlipidemia Father     History  Substance Use Topics  . Smoking status: Never Smoker   . Smokeless tobacco: Not on file  . Alcohol Use: Not on file     Comment: occasionally    OB History    Grav Para Term Preterm Abortions TAB SAB Ect Mult Living                  Review of Systems  Constitutional: Positive for chills. Negative for fever, diaphoresis,  activity change, fatigue and unexpected weight change.  HENT: Positive for congestion and rhinorrhea. Negative for ear pain.   Respiratory: Positive for cough and wheezing.   Musculoskeletal: Negative for myalgias.  Skin: Negative for color change, rash and wound.    Allergies  Codeine and Sulfa antibiotics  Home Medications   Current Outpatient Rx  Name  Route  Sig  Dispense  Refill  . ALBUTEROL SULFATE HFA 108 (90 BASE) MCG/ACT IN AERS   Inhalation   Inhale 1-2 puffs into the lungs every 6 (six) hours as needed for wheezing.   1 Inhaler   0   . AZITHROMYCIN 250 MG PO TABS   Oral   Take 1 tablet (250 mg total) by mouth daily. Take first 2 tablets together, then 1 every day until finished.   6 tablet   0   . DICLOFENAC SODIUM 75 MG PO TBEC   Oral   Take 1 tablet (75 mg total) by mouth 2 (two) times daily.   60 tablet   3   . HYDROCHLOROTHIAZIDE 25 MG PO TABS   Oral   Take 2 tablets (50 mg total) by mouth daily.   30 tablet   3   . LISINOPRIL 10 MG PO TABS   Oral   Take 1 tablet (10 mg total) by mouth daily.   90 tablet   3   . PREDNISONE 20 MG PO TABS  Oral   Take 2 tablets (40 mg total) by mouth daily. 2 tablets daily for 5 days   10 tablet   0   . SIMVASTATIN 20 MG PO TABS   Oral   Take 1 tablet (20 mg total) by mouth every evening.   30 tablet   11   . TRIAMCINOLONE ACETONIDE 0.1 % EX CREA   Topical   Apply topically 2 (two) times daily.   85.2 g   3     BP 143/89  Pulse 82  Temp 98.5 F (36.9 C) (Oral)  Resp 20  SpO2 98%  Physical Exam  Nursing note and vitals reviewed. Constitutional: Vital signs are normal. She appears well-developed and well-nourished.  Non-toxic appearance. She does not have a sickly appearance. She does not appear ill. No distress.  HENT:  Head: Normocephalic.  Mouth/Throat: Uvula is midline, oropharynx is clear and moist and mucous membranes are normal.  Eyes: Conjunctivae normal are normal.  Neck: Neck supple.  No JVD present.  Cardiovascular: Normal rate.   Pulmonary/Chest: Effort normal. No respiratory distress. She has no decreased breath sounds. She has wheezes. She has no rhonchi. She has no rales. She exhibits no tenderness.  Lymphadenopathy:    She has no cervical adenopathy.  Skin: No erythema.    ED Course  Procedures (including critical care time)  Labs Reviewed - No data to display No results found.   1. Bronchitis       MDM  Bronchitis with RAD, Rx , albuterol- macrolide and prednisone. Patient instructed to return in 5-7 days if no improvement or if cough persists for a second lung exam and perhaps x-rays        Jimmie Molly, MD 03/15/12 1436

## 2012-03-15 NOTE — ED Notes (Signed)
Pt   Reports    Symptoms  Of  Cough     Congestion       Pain   Her  Chest    From   Coughing        Pt      Reports    The  Symptoms  X  3  Weeks         -         Pt        denys  Being  A  Smoker               She  Is  Awake  And  Alert  And  Oriented

## 2012-04-18 ENCOUNTER — Encounter: Payer: Self-pay | Admitting: Family Medicine

## 2012-04-18 ENCOUNTER — Ambulatory Visit (INDEPENDENT_AMBULATORY_CARE_PROVIDER_SITE_OTHER): Payer: Self-pay | Admitting: Family Medicine

## 2012-04-18 VITALS — BP 107/70 | HR 90 | Temp 98.6°F | Ht 61.0 in | Wt 133.0 lb

## 2012-04-18 DIAGNOSIS — I10 Essential (primary) hypertension: Secondary | ICD-10-CM

## 2012-04-18 DIAGNOSIS — R1310 Dysphagia, unspecified: Secondary | ICD-10-CM

## 2012-04-18 NOTE — Assessment & Plan Note (Signed)
To get Barium Swallow to assess her dyspagia--then possible referral on to GI for upper endoscopy.  Would need lower too for screening purposes.

## 2012-04-18 NOTE — Assessment & Plan Note (Signed)
BP is so well controlled on this medication alone, will hold her HCTZ.

## 2012-04-18 NOTE — Patient Instructions (Signed)
Dysphagia Swallowing problems (dysphagia) occur when solids and liquids seem to stick in your throat on the way down to your stomach, or the food takes longer to get to the stomach. Other symptoms (problems) include regurgitating (burping) up food, noises coming from the throat, chest discomfort with swallowing, and a feeling of fullness in the throat when swallowing. When blockage in the throat is complete it may be associated with drooling. CAUSES There are many causes of swallowing difficulties and the following is generalized information regarding a number of reasons for this problem. Problems with swallowing may occur because of problems with the muscles. The food cannot be propelled in the usual manner into the stomach. There may be ulcers, scar tissue, or inflammation (soreness) in the esophagus (the food tube from the mouth to the stomach) which blocks food from passing normally into the stomach. Causes of inflammation include acid reflux from the stomach into the esophagus. Inflammation can also be caused by the herpes simplex virus, Candida (yeast), radiation (as with treatment of cancer), or inflammation from medicines not taken with adequate fluids to wash them down into the stomach. There may be nerve problems so signals cannot be sent adequately telling the muscles of the esophagus to contract and move the food along. Achalasia is a rare disorder of the esophagus in which muscular contractions of the esophagus are uncoordinated. Globus hystericus is a relatively common problem in young females in which there is a sense of an obstruction or difficulty in swallowing, but in which no abnormalities can be found. This problem usually improves over time with reassurance and testing to rule out other causes. DIAGNOSIS A number of tests will help your caregiver know what is the cause of your swallowing problems. These tests may include a barium swallow in which X-rays are taken while you are drinking a  liquid that outlines the lining of the esophagus on X-ray. If the stomach and small bowel are also studied in this manner it is called an upper gastrointestinal exam (UGI). Endoscopy may be done in which your caregiver examines your throat, esophagus, stomach, and small bowel with a small, flexible scope. Motility studies which measure the effectiveness and coordination of the muscular contractions of the esophagus may also be done. TREATMENT The treatment of swallowing problems are many, varying from medicines to surgical treatment. The treatment varies with the type of problem found. Your caregiver will discuss your results and treatment with you. If swallowing problems are severe the long-term problems which may occur include: malnutrition, pneumonia (from food going into the breathing tubes called trachea and bronchi), and an increase in tumors (lumps) of the esophagus. SEEK IMMEDIATE MEDICAL CARE IF:  Food or another object becomes lodged in your throat or esophagus and will not move. Document Released: 03/25/2000 Document Revised: 09/27/2011 Document Reviewed: 11/14/2007 [facility] Patient Information 2013 [place], [place].  Patient information: Dysphagia (The Basics)  What is dysphagia? - Dysphagia is the medical term for "trouble swallowing." Sometimes, dysphagia happens if you eat too fast or don't chew your food well enough. But if you have dysphagia, you might have a serious medical problem that needs to be treated right away.  What causes dysphagia? - Dysphagia is usually caused by a problem in the upper part of your digestive tract (figure 1). Often, the problem is in the esophagus, tube that connects your mouth to your stomach. But it can also happen because of a problem in the mouth or throat.  What are the symptoms of dysphagia? -  The symptoms include:  Not being able to swallow  Pain while swallowing  Feeling like food is stuck in your throat or chest  Coughing or gagging while  swallowing  Drooling  Trouble speaking  Should I see a doctor or nurse? - See your doctor or nurse if you have any of the above symptoms. Do so right away if you are unable to swallow your own saliva and are drooling or have trouble speaking. Are there tests for dysphagia? - Yes. Your doctor or nurse will do an exam and ask about your symptoms. Other test might include:  A barium X-ray - For this test, you drink a thick liquid called a "barium solution." It coats the inside of your esophagus. The barium shows up on X-rays, so the doctor can see any problems in the esophagus.  A swallowing study - For this test, you eat different foods coated with barium. X-rays taken at different time points during the test show if you have problems with the muscles in your mouth or throat. This test is also called a "videofluoroscopy."  Upper endoscopy - For this test, the doctor puts a thin, flexible tube into your mouth, down your throat, and into your esophagus. The tube (called an endoscope) has a camera and a light on it, so it allows the doctor to see inside the esophagus.  Manometry - For this test, the doctor puts a small tube that measures the pressure in your esophagus. The readings show how well the muscles that help you swallow are working.  How is dysphagia treated? - The treatment depends on what is causing the dysphagia. If you have dysphagia because of a problem in your mouth and the upper part of your throat, your doctor might refer you to a speech or swallowing specialist who can teach you exercises to help you swallow.  If the problem affects your esophagus, treatments can include:  Esophageal dilation - For this procedure, the doctor uses an endoscope (see above) with a special balloon on the end to gently stretch and widen your esophagus.  Surgery - Doctors can do surgery to remove any tumors or other abnormal tissue in the esophagus.  Medicines - Medicines used to treat dysphagia include:    Medicines that reduce stomach acid, such as proton pump inhibitors (table 1)  Medicines to treat an infection of the esophagus.

## 2012-04-18 NOTE — Progress Notes (Signed)
  Subjective:    Patient ID: Jody Mcclure, female    DOB: 1955/10/07, 57 y.o.   MRN: [id number]  HPI  Reports 1-2 month history of feeling like food is getting stuck in her throat.  She reports that solid and liquids are both getting stuck.  She has regurgitated on 1 or 2 occasions.  She reports it is getting worse.  She also is having sore throat and indigestion as a consequence of this.  She reports needing to continue to swallow other things to get the food to move down.  She reports never smoking.  She did get her disability approved.  Has been out of HCTZ and only taking her lisinopril for at least 1 month.  Review of Systems  Constitutional: Negative for fever, chills and unexpected weight change.  HENT: Negative for congestion and rhinorrhea.   Gastrointestinal: Negative for diarrhea and constipation.  Skin: Negative for rash.       Objective:   Physical Exam  Vitals reviewed. Constitutional: She appears well-developed.  HENT:  Head: Normocephalic and atraumatic.  Neck: Neck supple.  Cardiovascular: Normal rate and regular rhythm.   No murmur heard. Abdominal: Soft. There is no tenderness.          Assessment & Plan:

## 2012-04-20 ENCOUNTER — Ambulatory Visit (HOSPITAL_COMMUNITY)
Admission: RE | Admit: 2012-04-20 | Discharge: 2012-04-20 | Disposition: A | Discharge status: Home / Self Care | Payer: Medicaid Other | Source: Ambulatory Visit | Attending: Family Medicine | Admitting: Family Medicine

## 2012-04-20 DIAGNOSIS — K219 Gastro-esophageal reflux disease without esophagitis: Secondary | ICD-10-CM | POA: Insufficient documentation

## 2012-04-20 DIAGNOSIS — R1310 Dysphagia, unspecified: Secondary | ICD-10-CM

## 2012-04-20 DIAGNOSIS — K224 Dyskinesia of esophagus: Secondary | ICD-10-CM | POA: Insufficient documentation

## 2012-04-20 DIAGNOSIS — K449 Diaphragmatic hernia without obstruction or gangrene: Secondary | ICD-10-CM | POA: Insufficient documentation

## 2012-04-20 DIAGNOSIS — K222 Esophageal obstruction: Secondary | ICD-10-CM | POA: Insufficient documentation

## 2012-04-23 ENCOUNTER — Other Ambulatory Visit: Payer: Self-pay | Admitting: Family Medicine

## 2012-04-23 DIAGNOSIS — R1310 Dysphagia, unspecified: Secondary | ICD-10-CM

## 2012-04-23 NOTE — Progress Notes (Signed)
X-ray shows stricture--Pt. Told of this result--will need referral to GI--Please arrange and inform pt.  Letter written.

## 2012-04-24 NOTE — Progress Notes (Signed)
Placed in [facility] workqueue.  They will call to schedule. Jody Mcclure, Jody Mcclure

## 2012-05-03 ENCOUNTER — Telehealth: Payer: Self-pay | Admitting: Family Medicine

## 2012-05-03 NOTE — Telephone Encounter (Signed)
Pt is calling to get the results of her xray of her throat.Jody Mcclure

## 2012-05-04 ENCOUNTER — Encounter: Payer: Self-pay | Admitting: Gastroenterology

## 2012-05-04 NOTE — Telephone Encounter (Signed)
Please see note from 1/13 by me.  Rads said they gave her this result--there is a stricture.  She needs to see GI

## 2012-05-04 NOTE — Telephone Encounter (Signed)
Pt informed. Jody Mcclure Dawn  

## 2012-05-04 NOTE — Telephone Encounter (Signed)
Abnormal finding. Will forward to MD. Milas Gain, Maryjo [place]

## 2012-05-04 NOTE — Telephone Encounter (Signed)
LMOVM for pt to return call.  Pt has a stricture which is a narrowing of her esophagus (tube from the mouth to the stomach).  We have referred her to [facility] and actually they called her on 1.21.14 and LM for her to call back and schedule.  Please ask her to give them a call back @ [phone]. Fleeger, Maryjo Rochester

## 2012-05-10 ENCOUNTER — Other Ambulatory Visit: Payer: Self-pay | Admitting: Family Medicine

## 2012-05-23 ENCOUNTER — Encounter: Payer: Self-pay | Admitting: Gastroenterology

## 2012-05-23 ENCOUNTER — Ambulatory Visit (INDEPENDENT_AMBULATORY_CARE_PROVIDER_SITE_OTHER): Payer: Medicaid Other | Admitting: Gastroenterology

## 2012-05-23 VITALS — BP 142/80 | HR 68 | Ht 61.0 in | Wt 138.0 lb

## 2012-05-23 DIAGNOSIS — Z1211 Encounter for screening for malignant neoplasm of colon: Secondary | ICD-10-CM

## 2012-05-23 DIAGNOSIS — R933 Abnormal findings on diagnostic imaging of other parts of digestive tract: Secondary | ICD-10-CM

## 2012-05-23 DIAGNOSIS — R1310 Dysphagia, unspecified: Secondary | ICD-10-CM

## 2012-05-23 MED ORDER — PEG-KCL-NACL-NASULF-NA ASC-C 100 G PO SOLR: 1.0000 | Freq: Once | ORAL | Status: DC

## 2012-05-23 NOTE — Progress Notes (Signed)
HPI: This is a   very pleasant 57 year old woman whom I am meeting for the first time today.  Was having dysphagia for about a month, intermittent solid food only.  About 2 months ago.    She would usually take some time, wash it down with water and the symptoms would improve. Never had to vomit or regurgitate.  Overall her weight has been stable.  NO overt GI bleeding.  Pretty rare pyrosis. Takes tums maybe once per month.  Moves bowels without issues.  No overt bleeding.  She has never had colon cancer screening that she is aware of and has definitely not had a colonoscopy2   IMPRESSION from Barium esophagram 04/2012: Small hiatal hernia with Schatzki's ring that did not allow passage of a 12.5 mm barium tablet, consistent with a mild stricture. Mild esophageal dysmotility. Trace gastroesophageal reflux. Trace laryngeal penetration with thin barium liquid. No evidence of tracheal aspiration.   Review of systems: Pertinent positive and negative review of systems were noted in the above HPI section. Complete review of systems was performed and was otherwise normal.    Past Medical History  Diagnosis Date  . Hyperlipidemia     Past Surgical History  Procedure Laterality Date  . Abdominal hysterectomy    . Tonsillectomy      Current Outpatient Prescriptions  Medication Sig Dispense Refill  . lisinopril (PRINIVIL,ZESTRIL) 10 MG tablet Take 1 tablet (10 mg total) by mouth daily.  90 tablet  3  . simvastatin (ZOCOR) 20 MG tablet Take 1 tablet (20 mg total) by mouth every evening.  30 tablet  11  . triamcinolone cream (KENALOG) 0.1 % Apply topically 2 (two) times daily.  85.2 g  3   No current facility-administered medications for this visit.    Allergies as of 05/23/2012 - Review Complete 05/23/2012  Allergen Reaction Noted  . Codeine Nausea And Vomiting 08/15/2011  . Sulfa antibiotics Rash 08/15/2011    Family History  Problem Relation Age of Onset  . Cancer Mother    lung  . Hyperlipidemia Mother   . Hypertension Father   . Hyperlipidemia Father     History   Social History  . Marital Status: Single    Spouse Name: N/A    Number of Children: 2  . Years of Education: N/A   Occupational History  . Unemployed     Social History Main Topics  . Smoking status: Never Smoker   . Smokeless tobacco: Never Used  . Alcohol Use: Yes     Comment: 1 daily  . Drug Use: No  . Sexually Active: Not on file   Other Topics Concern  . Not on file   Social History Narrative   Daily caffeine        Physical Exam: BP 142/80  Pulse 68  Ht 5\' 1"  (1.549 m)  Wt 138 lb (62.596 kg)  BMI 26.09 kg/m2 Constitutional: generally well-appearing Psychiatric: alert and oriented x3 Eyes: extraocular movements intact Mouth: oral pharynx moist, no lesions Neck: supple no lymphadenopathy Cardiovascular: heart regular rate and rhythm Lungs: clear to auscultation bilaterally Abdomen: soft, nontender, nondistended, no obvious ascites, no peritoneal signs, normal bowel sounds Extremities: no lower extremity edema bilaterally Skin: no lesions on visible extremities    Assessment and plan: 57 y.o. female with  Schatzki's ring, intermittent dysphasia, routine risk for colon cancer  I will schedule her for colonoscopy for routine risk screening as well as upper endoscopy with plans to dilate what is likely a  Schatzki's ring. She knows to chew her food well, eat slowly, take small bites in the. She has not really had any GERD symptoms and so I don't think PPI would necessarily help this for now.

## 2012-05-23 NOTE — Patient Instructions (Addendum)
You will be set up for an upper endoscopy for dysphagia, abnormal esophagram. You will be set up for a colonoscopy for routine colon cancer screening (both [facility] moderate sedation). For now, chew your food well, eat slowly, take small bites.

## 2012-05-29 ENCOUNTER — Telehealth: Payer: Self-pay | Admitting: Family Medicine

## 2012-05-29 DIAGNOSIS — R21 Rash and other nonspecific skin eruption: Secondary | ICD-10-CM

## 2012-05-29 NOTE — Telephone Encounter (Signed)
Patient has got the same rash she had previously and would like a referral to go back to see Dr. Terri Piedra for treatment.  The last referral has expired.

## 2012-05-29 NOTE — Telephone Encounter (Signed)
[facility] given referral, pt informed to call and schedule appt. Yadiel Aubry, Maryjo Rochester

## 2012-05-29 NOTE — Telephone Encounter (Signed)
Will forward to MD for referral.  If pt has indeed seen [facility] in the past so we should be able to schedule there again, will just need a new referral put in. Helmuth Recupero, Maryjo Rochester

## 2012-06-06 ENCOUNTER — Ambulatory Visit (AMBULATORY_SURGERY_CENTER): Payer: Medicaid Other | Admitting: Gastroenterology

## 2012-06-06 ENCOUNTER — Encounter: Payer: Self-pay | Admitting: Gastroenterology

## 2012-06-06 VITALS — BP 101/74 | HR 78 | Temp 97.3°F | Resp 47 | Ht 61.0 in | Wt 138.0 lb

## 2012-06-06 DIAGNOSIS — K2990 Gastroduodenitis, unspecified, without bleeding: Secondary | ICD-10-CM

## 2012-06-06 DIAGNOSIS — R933 Abnormal findings on diagnostic imaging of other parts of digestive tract: Secondary | ICD-10-CM

## 2012-06-06 DIAGNOSIS — K2970 Gastritis, unspecified, without bleeding: Secondary | ICD-10-CM

## 2012-06-06 DIAGNOSIS — Z1211 Encounter for screening for malignant neoplasm of colon: Secondary | ICD-10-CM

## 2012-06-06 DIAGNOSIS — R1310 Dysphagia, unspecified: Secondary | ICD-10-CM

## 2012-06-06 MED ORDER — SODIUM CHLORIDE 0.9 % IV SOLN: 500.0000 mL | INTRAVENOUS | Status: DC

## 2012-06-06 NOTE — Op Note (Signed)
[facility] [address] [place], [address]   ENDOSCOPY PROCEDURE REPORT  PATIENT: Jody, Mcclure  MR#: [id number] BIRTHDATE: 1955-05-18 , 56  yrs. old GENDER: Female ENDOSCOPIST: Rachael Fee, MD PROCEDURE DATE:  06/06/2012 PROCEDURE:  EGD w/ biopsy ASA CLASS:     Class III INDICATIONS:  previous dysphagia, Barium esophagram showed schatzki's ring. MEDICATIONS: Fentanyl 25 mcg IV, Versed 2 mg IV, These medications were titrated to patient response per physician's verbal order, and There was residual sedation effect present from prior procedure. TOPICAL ANESTHETIC: Cetacaine Spray  DESCRIPTION OF PROCEDURE: After the risks benefits and alternatives of the procedure were thoroughly explained, informed consent was obtained.  The LB GIF-H180 [id number] endoscope was introduced through the mouth and advanced to the second portion of the duodenum. Without limitations.  The instrument was slowly withdrawn as the mucosa was fully examined.     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  Biopsies were taken from distal stomach to check for H.pylori.  There was a 1/3 circumference mucosal ring of tissue (Schatzki's ring) above a 2cm hiatal hernia. This created a very small shelf along 1/3 lumen.  I did not feel that dilation would effect this ring and since her swallowing troubles have completely resolved did not perform dilation.  The examination was otherwise normal.  Retroflexed views revealed no abnormalities.     The scope was then withdrawn from the patient and the procedure completed.  COMPLICATIONS: There were no complications. 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  Biopsies were taken from distal stomach to check for H.pylori.  There was a 1/3 circumference mucosal ring of tissue (Schatzki's ring) above a 2cm hiatal hernia. This created a very small shelf along 1/3 lumen.  I did not feel that dilation would effect this ring and since her  swallowing troubles have completely resolved did not perform dilation.  The examination was otherwise normal.  RECOMMENDATIONS: Await biopsy results.   eSigned:  Rachael Fee, MD 06/06/2012 3:18 PM   CC:Jonette Eva, MD

## 2012-06-06 NOTE — Op Note (Signed)
[facility] [address] [place], [address]   COLONOSCOPY PROCEDURE REPORT  PATIENT: Jody Mcclure, Jody Mcclure  MR#: [id number] BIRTHDATE: 06-06-1955 , 56  yrs. old GENDER: Female ENDOSCOPIST: Rachael Fee, MD REFERRED XB:JYNWG Pratt, M.D. PROCEDURE DATE:  06/06/2012 PROCEDURE:   Colonoscopy, screening ASA CLASS:   Class III INDICATIONS:average risk screening. MEDICATIONS: Fentanyl 75 mcg IV, Versed 6 mg IV, and These medications were titrated to patient response per physician's verbal order  DESCRIPTION OF PROCEDURE:   After the risks benefits and alternatives of the procedure were thoroughly explained, informed consent was obtained.  A digital rectal exam revealed no abnormalities of the rectum.   The LB [id number]-H180AL [id number]  endoscope was introduced through the anus and advanced to the cecum, which was identified by both the appendix and ileocecal valve. No adverse events experienced.   The quality of the prep was good, using MoviPrep  The instrument was then slowly withdrawn as the colon was fully examined.   COLON FINDINGS: A normal appearing cecum, ileocecal valve, and appendiceal orifice were identified.  The ascending, hepatic flexure, transverse, splenic flexure, descending, sigmoid colon and rectum appeared unremarkable.  No polyps or cancers were seen. Retroflexed views revealed no abnormalities. The time to cecum=2 minutes 58 seconds.  Withdrawal time=6 minutes 41 seconds.  The scope was withdrawn and the procedure completed. COMPLICATIONS: There were no complications.  ENDOSCOPIC IMPRESSION: Normal colon No polyps or cancers  RECOMMENDATIONS: You should continue to follow colorectal cancer screening guidelines for "routine risk" patients with a repeat colonoscopy in 10 years. There is no need for FOBT (stool) testing for at least 5 years.   eSigned:  Rachael Fee, MD 06/06/2012 3:12 PM

## 2012-06-06 NOTE — Progress Notes (Signed)
Circular areas noted on right forearm prior to procedure, patient stating no other skin abnormalities. Patient stating she has a dermatology appointment.patient has two fingers on left hand taped together with duc tape.

## 2012-06-06 NOTE — Patient Instructions (Addendum)
Discharge instructions given with verbal understanding. Normal colon. Resume previous medications. YOU HAD AN ENDOSCOPIC PROCEDURE TODAY AT THE [facility]: Refer to the procedure report that was given to you for any specific questions about what was found during the examination.  If the procedure report does not answer your questions, please call your gastroenterologist to clarify.  If you requested that your care partner not be given the details of your procedure findings, then the procedure report has been included in a sealed envelope for you to review at your convenience later.  YOU SHOULD EXPECT: Some feelings of bloating in the abdomen. Passage of more gas than usual.  Walking can help get rid of the air that was put into your GI tract during the procedure and reduce the bloating. If you had a lower endoscopy (such as a colonoscopy or flexible sigmoidoscopy) you may notice spotting of blood in your stool or on the toilet paper. If you underwent a bowel prep for your procedure, then you may not have a normal bowel movement for a few days.  DIET: Your first meal following the procedure should be a light meal and then it is ok to progress to your normal diet.  A half-sandwich or bowl of soup is an example of a good first meal.  Heavy or fried foods are harder to digest and may make you feel nauseous or bloated.  Likewise meals heavy in dairy and vegetables can cause extra gas to form and this can also increase the bloating.  Drink plenty of fluids but you should avoid alcoholic beverages for 24 hours.  ACTIVITY: Your care partner should take you home directly after the procedure.  You should plan to take it easy, moving slowly for the rest of the day.  You can resume normal activity the day after the procedure however you should NOT DRIVE or use heavy machinery for 24 hours (because of the sedation medicines used during the test).    SYMPTOMS TO REPORT IMMEDIATELY: A gastroenterologist  can be reached at any hour.  During normal business hours, 8:30 AM to 5:00 PM Monday through Friday, call [phone].  After hours and on weekends, please call the GI answering service at [phone] who will take a message and have the physician on call contact you.   Following lower endoscopy (colonoscopy or flexible sigmoidoscopy):  Excessive amounts of blood in the stool  Significant tenderness or worsening of abdominal pains  Swelling of the abdomen that is new, acute  Fever of 100F or higher  Following upper endoscopy (EGD)  Vomiting of blood or coffee ground material  New chest pain or pain under the shoulder blades  Painful or persistently difficult swallowing  New shortness of breath  Fever of 100F or higher  Black, tarry-looking stools  FOLLOW UP: If any biopsies were taken you will be contacted by phone or by letter within the next 1-3 weeks.  Call your gastroenterologist if you have not heard about the biopsies in 3 weeks.  Our staff will call the home number listed on your records the next business day following your procedure to check on you and address any questions or concerns that you may have at that time regarding the information given to you following your procedure. This is a courtesy call and so if there is no answer at the home number and we have not heard from you through the emergency physician on call, we will assume that you have returned to your  regular daily activities without incident.  SIGNATURES/CONFIDENTIALITY: You and/or your care partner have signed paperwork which will be entered into your electronic medical record.  These signatures attest to the fact that that the information above on your After Visit Summary has been reviewed and is understood.  Full responsibility of the confidentiality of this discharge information lies with you and/or your care-partner.
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
## 2012-06-07 ENCOUNTER — Telehealth: Payer: Self-pay

## 2012-06-07 NOTE — Telephone Encounter (Signed)
Left message on answering machine. 

## 2012-06-12 ENCOUNTER — Encounter: Payer: Self-pay | Admitting: Gastroenterology

## 2012-07-21 ENCOUNTER — Other Ambulatory Visit: Payer: Self-pay | Admitting: Family Medicine

## 2012-09-12 ENCOUNTER — Other Ambulatory Visit: Payer: Self-pay | Admitting: Dermatology

## 2012-11-12 ENCOUNTER — Other Ambulatory Visit: Payer: Self-pay | Admitting: Family Medicine

## 2012-12-13 ENCOUNTER — Encounter: Payer: Self-pay | Admitting: Family Medicine

## 2012-12-13 ENCOUNTER — Ambulatory Visit (INDEPENDENT_AMBULATORY_CARE_PROVIDER_SITE_OTHER): Payer: Medicaid Other | Admitting: Family Medicine

## 2012-12-13 VITALS — BP 121/75 | HR 81 | Temp 99.1°F | Ht 61.0 in | Wt 130.0 lb

## 2012-12-13 DIAGNOSIS — M533 Sacrococcygeal disorders, not elsewhere classified: Secondary | ICD-10-CM

## 2012-12-13 DIAGNOSIS — K219 Gastro-esophageal reflux disease without esophagitis: Secondary | ICD-10-CM

## 2012-12-13 DIAGNOSIS — E7800 Pure hypercholesterolemia, unspecified: Secondary | ICD-10-CM

## 2012-12-13 DIAGNOSIS — I10 Essential (primary) hypertension: Secondary | ICD-10-CM

## 2012-12-13 DIAGNOSIS — F32A Depression, unspecified: Secondary | ICD-10-CM | POA: Insufficient documentation

## 2012-12-13 DIAGNOSIS — F329 Major depressive disorder, single episode, unspecified: Secondary | ICD-10-CM

## 2012-12-13 DIAGNOSIS — R21 Rash and other nonspecific skin eruption: Secondary | ICD-10-CM

## 2012-12-13 DIAGNOSIS — M545 Low back pain, unspecified: Secondary | ICD-10-CM

## 2012-12-13 DIAGNOSIS — R1310 Dysphagia, unspecified: Secondary | ICD-10-CM

## 2012-12-13 DIAGNOSIS — Z1239 Encounter for other screening for malignant neoplasm of breast: Secondary | ICD-10-CM

## 2012-12-13 MED ORDER — LISINOPRIL 10 MG PO TABS: 10.0000 mg | ORAL_TABLET | Freq: Every day | ORAL | Status: DC

## 2012-12-13 MED ORDER — OMEPRAZOLE MAGNESIUM 20 MG PO TBEC: 20.0000 mg | DELAYED_RELEASE_TABLET | Freq: Every day | ORAL | Status: DC

## 2012-12-13 MED ORDER — VENLAFAXINE HCL 37.5 MG PO TABS: 37.5000 mg | ORAL_TABLET | Freq: Two times a day (BID) | ORAL | Status: DC
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
## 2012-12-13 NOTE — Assessment & Plan Note (Signed)
Trial of effexor--may help with chronic pain as well.  Rtn in 4 wks with dose adjustment if needed.

## 2012-12-13 NOTE — Assessment & Plan Note (Signed)
Slow improvement 

## 2012-12-13 NOTE — Patient Instructions (Signed)
Chronic Pain Chronic pain can be defined as pain that is lasting, off and on, and lasts for 3 to 6 months or longer. Many things cause chronic pain, which can make it difficult to make a discrete diagnosis. There are many treatment options available for chronic pain. However, finding a treatment that works well for you may require trying various approaches until a suitable one is found. CAUSES  In some types of chronic medical conditions, the pain is caused by a normal pain response within the body. A normal pain response helps the body identify illness or injury and prevent further damage from being done. In these cases, the cause of the pain may be identified and treated, even if it may not be cured completely. Examples of chronic conditions which can cause chronic pain include:  Inflammation of the joints (arthritis).  Back pain or neck pain (including bulging or herniated disks).  Migraine headaches.  Cancer. In some other types of chronic pain syndromes, the pain is caused by an abnormal pain response within the body. An abnormal pain response is present when there is no ongoing cause (or stimulus) for the pain, or when the cause of the pain is arising from the nerves or nervous system itself. Examples of conditions which can cause chronic pain due to an abnormal pain response include:  Fibromyalgia.  Reflex sympathetic dystrophy (RSD).  Neuropathy (when the nerves themselves are damaged, and may cause pain). DIAGNOSIS  Your caregiver will help diagnose your condition over time. In many cases, the initial focus will be on excluding conditions that could be causing the pain. Depending on your symptoms, your caregiver may order some tests to diagnose your condition. Some of these tests include:  Blood tests.  Computerized X-ray scans (CT scan).  Computerized magnetic scans (MRI).  X-rays.  Ultrasounds.  Nerve conduction studies.  Consultation with other physicians or  specialists. TREATMENT  There are many treatment options for people suffering from chronic pain. Finding a treatment that works well may take time.   You may be referred to a pain management specialist.  You may be put on medication to help with the pain. Unfortunately, some medications (such as opiate medications) may not be very effective in cases where chronic pain is due to abnormal pain responses. Finding the right medications can take some time.  Adjunctive therapies may be used to provide additional relief and improve a patient's quality of life. These therapies include:  Mindfulness meditation.  Acupuncture.  Biofeedback.  Cognitive-behavioral therapy.  In certain cases, surgical interventions may be attempted. HOME CARE INSTRUCTIONS   Make sure you understand these instructions prior to discharge.  Ask any questions and share any further concerns you have with your caregiver prior to discharge.  Take all medications as directed by your caregiver.  Keep all follow-up appointments. SEEK MEDICAL CARE IF:   Your pain gets worse.  You develop a new pain that was not present before.  You cannot tolerate any medications prescribed by your caregiver.  You develop new symptoms since your last visit with your caregiver. SEEK IMMEDIATE MEDICAL CARE IF:   You develop muscular weakness.  You have decreased sensation or numbness.  You lose control of bowel or bladder function.  Your pain suddenly gets much worse.  You have an oral temperature above 102 F (38.9 C), not controlled by medication.  You develop shaking chills, confusion, chest pain, or shortness of breath. Document Released: 12/18/2001 Document Revised: 06/20/2011 Document Reviewed: 03/26/2008 [employer] Patient Information  2014 [place], [place]. Anxiety and Panic Attacks Your caregiver has informed you that you are having an anxiety or panic attack. There may be many forms of this. Most of the time these  attacks come suddenly and without warning. They come at any time of day, including periods of sleep, and at any time of life. They may be strong and unexplained. Although panic attacks are very scary, they are physically harmless. Sometimes the cause of your anxiety is not known. Anxiety is a protective mechanism of the body in its fight or flight mechanism. Most of these perceived danger situations are actually nonphysical situations (such as anxiety over losing a job). CAUSES  The causes of an anxiety or panic attack are many. Panic attacks may occur in otherwise healthy people given a certain set of circumstances. There may be a genetic cause for panic attacks. Some medications may also have anxiety as a side effect. SYMPTOMS  Some of the most common feelings are:  Intense terror.  Dizziness, feeling faint.  Hot and cold flashes.  Fear of going crazy.  Feelings that nothing is real.  Sweating.  Shaking.  Chest pain or a fast heartbeat (palpitations).  Smothering, choking sensations.  Feelings of impending doom and that death is near.  Tingling of extremities, this may be from over-breathing.  Altered reality (derealization).  Being detached from yourself (depersonalization). Several symptoms can be present to make up anxiety or panic attacks. DIAGNOSIS  The evaluation by your caregiver will depend on the type of symptoms you are experiencing. The diagnosis of anxiety or panic attack is made when no physical illness can be determined to be a cause of the symptoms. TREATMENT  Treatment to prevent anxiety and panic attacks may include:  Avoidance of circumstances that cause anxiety.  Reassurance and relaxation.  Regular exercise.  Relaxation therapies, such as yoga.  Psychotherapy with a psychiatrist or therapist.  Avoidance of caffeine, alcohol and illegal drugs.  Prescribed medication. SEEK IMMEDIATE MEDICAL CARE IF:   You experience panic attack symptoms that  are different than your usual symptoms.  You have any worsening or concerning symptoms. Document Released: 03/28/2005 Document Revised: 06/20/2011 Document Reviewed: 07/30/2009 [facility] Patient Information 2014 [place], [place]. Depression, Adult Depression refers to feeling sad, low, down in the dumps, blue, gloomy, or empty. In general, there are two kinds of depression: 1. Depression that we all experience from time to time because of upsetting life experiences, including the loss of a job or the ending of a relationship (normal sadness or normal grief). This kind of depression is considered normal, is short lived, and resolves within a few days to 2 weeks. (Depression experienced after the loss of a loved one is called bereavement. Bereavement often lasts longer than 2 weeks but normally gets better with time.) 2. Clinical depression, which lasts longer than normal sadness or normal grief or interferes with your ability to function at home, at work, and in school. It also interferes with your personal relationships. It affects almost every aspect of your life. Clinical depression is an illness. Symptoms of depression also can be caused by conditions other than normal sadness and grief or clinical depression. Examples of these conditions are listed as follows:  Physical illness Some physical illnesses, including underactive thyroid gland (hypothyroidism), severe anemia, specific types of cancer, diabetes, uncontrolled seizures, heart and lung problems, strokes, and chronic pain are commonly associated with symptoms of depression.  Side effects of some prescription medicine In some people, certain types of prescription  medicine can cause symptoms of depression.  Substance abuse Abuse of alcohol and illicit drugs can cause symptoms of depression. SYMPTOMS Symptoms of normal sadness and normal grief include the following:  Feeling sad or crying for short periods of time.  Not caring about  anything (apathy).  Difficulty sleeping or sleeping too much.  No longer able to enjoy the things you used to enjoy.  Desire to be by oneself all the time (social isolation).  Lack of energy or motivation.  Difficulty concentrating or remembering.  Change in appetite or weight.  Restlessness or agitation. Symptoms of clinical depression include the same symptoms of normal sadness or normal grief and also the following symptoms:  Feeling sad or crying all the time.  Feelings of guilt or worthlessness.  Feelings of hopelessness or helplessness.  Thoughts of suicide or the desire to harm yourself (suicidal ideation).  Loss of touch with reality (psychotic symptoms). Seeing or hearing things that are not real (hallucinations) or having false beliefs about your life or the people around you (delusions and paranoia). DIAGNOSIS  The diagnosis of clinical depression usually is based on the severity and duration of the symptoms. Your caregiver also will ask you questions about your medical history and substance use to find out if physical illness, use of prescription medicine, or substance abuse is causing your depression. Your caregiver also may order blood tests. TREATMENT  Typically, normal sadness and normal grief do not require treatment. However, sometimes antidepressant medicine is prescribed for bereavement to ease the depressive symptoms until they resolve. The treatment for clinical depression depends on the severity of your symptoms but typically includes antidepressant medicine, counseling with a mental health professional, or a combination of both. Your caregiver will help to determine what treatment is best for you. Depression caused by physical illness usually goes away with appropriate medical treatment of the illness. If prescription medicine is causing depression, talk with your caregiver about stopping the medicine, decreasing the dose, or substituting another  medicine. Depression caused by abuse of alcohol or illicit drugs abuse goes away with abstinence from these substances. Some adults need professional help in order to stop drinking or using drugs. SEEK IMMEDIATE CARE IF:  You have thoughts about hurting yourself or others.  You lose touch with reality (have psychotic symptoms).  You are taking medicine for depression and have a serious side effect. FOR MORE INFORMATION National Alliance on Mental Illness: www.nami.[employer]: http://www.maynard.net/ Document Released: 03/25/2000 Document Revised: 09/27/2011 Document Reviewed: 06/27/2011 [facility] Patient Information 2014 [place], [place]. Gastroesophageal Reflux Disease, Adult Gastroesophageal reflux disease (GERD) happens when acid from your stomach flows up into the esophagus. When acid comes in contact with the esophagus, the acid causes soreness (inflammation) in the esophagus. Over time, GERD may create small holes (ulcers) in the lining of the esophagus. CAUSES   Increased body weight. This puts pressure on the stomach, making acid rise from the stomach into the esophagus.  Smoking. This increases acid production in the stomach.  Drinking alcohol. This causes decreased pressure in the lower esophageal sphincter (valve or ring of muscle between the esophagus and stomach), allowing acid from the stomach into the esophagus.  Late evening meals and a full stomach. This increases pressure and acid production in the stomach.  A malformed lower esophageal sphincter. Sometimes, no cause is found. SYMPTOMS   Burning pain in the lower part of the mid-chest behind the breastbone and in the mid-stomach area. This may occur twice a  week or more often.  Trouble swallowing.  Sore throat.  Dry cough.  Asthma-like symptoms including chest tightness, shortness of breath, or wheezing. DIAGNOSIS  Your caregiver may be able to diagnose GERD based on your symptoms. In  some cases, X-rays and other tests may be done to check for complications or to check the condition of your stomach and esophagus. TREATMENT  Your caregiver may recommend over-the-counter or prescription medicines to help decrease acid production. Ask your caregiver before starting or adding any new medicines.  HOME CARE INSTRUCTIONS   Change the factors that you can control. Ask your caregiver for guidance concerning weight loss, quitting smoking, and alcohol consumption.  Avoid foods and drinks that make your symptoms worse, such as:  Caffeine or alcoholic drinks.  Chocolate.  Peppermint or mint flavorings.  Garlic and onions.  Spicy foods.  Citrus fruits, such as oranges, lemons, or limes.  Tomato-based foods such as sauce, chili, salsa, and pizza.  Fried and fatty foods.  Avoid lying down for the 3 hours prior to your bedtime or prior to taking a nap.  Eat small, frequent meals instead of large meals.  Wear loose-fitting clothing. Do not wear anything tight around your waist that causes pressure on your stomach.  Raise the head of your bed 6 to 8 inches with wood blocks to help you sleep. Extra pillows will not help.  Only take over-the-counter or prescription medicines for pain, discomfort, or fever as directed by your caregiver.  Do not take aspirin, ibuprofen, or other nonsteroidal anti-inflammatory drugs (NSAIDs). SEEK IMMEDIATE MEDICAL CARE IF:   You have pain in your arms, neck, jaw, teeth, or back.  Your pain increases or changes in intensity or duration.  You develop nausea, vomiting, or sweating (diaphoresis).  You develop shortness of breath, or you faint.  Your vomit is green, yellow, black, or looks like coffee grounds or blood.  Your stool is red, bloody, or black. These symptoms could be signs of other problems, such as heart disease, gastric bleeding, or esophageal bleeding. MAKE SURE YOU:   Understand these instructions.  Will watch your  condition.  Will get help right away if you are not doing well or get worse. Document Released: 01/05/2005 Document Revised: 06/20/2011 Document Reviewed: 10/15/2010 [facility] Patient Information 2014 [place], [place].

## 2012-12-13 NOTE — Assessment & Plan Note (Signed)
Bp well controlled 

## 2012-12-13 NOTE — Assessment & Plan Note (Signed)
Will add PPI.
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
## 2013-01-07 ENCOUNTER — Other Ambulatory Visit: Payer: Self-pay | Admitting: Family Medicine

## 2013-01-15 ENCOUNTER — Ambulatory Visit: Payer: Medicaid Other | Admitting: Family Medicine

## 2013-01-15 ENCOUNTER — Other Ambulatory Visit: Payer: Medicaid Other

## 2013-01-23 ENCOUNTER — Ambulatory Visit: Payer: Medicaid Other | Admitting: Family Medicine

## 2013-01-23 ENCOUNTER — Other Ambulatory Visit: Payer: Medicaid Other

## 2013-02-23 ENCOUNTER — Encounter (HOSPITAL_COMMUNITY): Payer: Self-pay | Admitting: Emergency Medicine

## 2013-02-23 ENCOUNTER — Emergency Department (HOSPITAL_COMMUNITY): Payer: Medicaid Other

## 2013-02-23 ENCOUNTER — Emergency Department (HOSPITAL_COMMUNITY)
Admission: EM | Admit: 2013-02-23 | Discharge: 2013-02-23 | Disposition: A | Discharge status: Home / Self Care | Payer: Medicaid Other | Attending: Emergency Medicine | Admitting: Emergency Medicine

## 2013-02-23 DIAGNOSIS — Y929 Unspecified place or not applicable: Secondary | ICD-10-CM | POA: Insufficient documentation

## 2013-02-23 DIAGNOSIS — F10129 Alcohol abuse with intoxication, unspecified: Secondary | ICD-10-CM

## 2013-02-23 DIAGNOSIS — IMO0002 Reserved for concepts with insufficient information to code with codable children: Secondary | ICD-10-CM | POA: Insufficient documentation

## 2013-02-23 DIAGNOSIS — F1010 Alcohol abuse, uncomplicated: Secondary | ICD-10-CM | POA: Insufficient documentation

## 2013-02-23 DIAGNOSIS — E785 Hyperlipidemia, unspecified: Secondary | ICD-10-CM | POA: Insufficient documentation

## 2013-02-23 DIAGNOSIS — Y9389 Activity, other specified: Secondary | ICD-10-CM | POA: Insufficient documentation

## 2013-02-23 DIAGNOSIS — W19XXXA Unspecified fall, initial encounter: Secondary | ICD-10-CM | POA: Insufficient documentation

## 2013-02-23 LAB — CBC WITH DIFFERENTIAL/PLATELET
Basophils Absolute: 0 10*3/uL (ref 0.0–0.1)
Basophils Relative: 1 % (ref 0–1)
Eosinophils Absolute: 0.3 10*3/uL (ref 0.0–0.7)
Eosinophils Relative: 4 % (ref 0–5)
HCT: 42.1 % (ref 36.0–46.0)
Hemoglobin: 14.5 g/dL (ref 12.0–15.0)
Lymphocytes Relative: 28 % (ref 12–46)
Lymphs Abs: 1.7 10*3/uL (ref 0.7–4.0)
MCH: 32.2 pg (ref 26.0–34.0)
MCHC: 34.4 g/dL (ref 30.0–36.0)
MCV: 93.3 fL (ref 78.0–100.0)
Monocytes Absolute: 0.5 10*3/uL (ref 0.1–1.0)
Monocytes Relative: 8 % (ref 3–12)
Neutro Abs: 3.6 10*3/uL (ref 1.7–7.7)
Neutrophils Relative %: 60 % (ref 43–77)
Platelets: 268 10*3/uL (ref 150–400)
RBC: 4.51 MIL/uL (ref 3.87–5.11)
RDW: 13.7 % (ref 11.5–15.5)
WBC: 6 10*3/uL (ref 4.0–10.5)

## 2013-02-23 LAB — URINALYSIS, ROUTINE W REFLEX MICROSCOPIC
Bilirubin Urine: NEGATIVE
Glucose, UA: NEGATIVE mg/dL
Ketones, ur: NEGATIVE mg/dL
Leukocytes, UA: NEGATIVE
Nitrite: NEGATIVE
Protein, ur: NEGATIVE mg/dL
Specific Gravity, Urine: 1.017 (ref 1.005–1.030)
Urobilinogen, UA: 0.2 mg/dL (ref 0.0–1.0)
pH: 5.5 (ref 5.0–8.0)

## 2013-02-23 LAB — RAPID URINE DRUG SCREEN, HOSP PERFORMED
Amphetamines: NOT DETECTED
Barbiturates: NOT DETECTED
Benzodiazepines: NOT DETECTED
Cocaine: POSITIVE — AB
Opiates: NOT DETECTED
Tetrahydrocannabinol: NOT DETECTED

## 2013-02-23 LAB — COMPREHENSIVE METABOLIC PANEL
ALT: 28 U/L (ref 0–35)
AST: 31 U/L (ref 0–37)
Albumin: 3.8 g/dL (ref 3.5–5.2)
Alkaline Phosphatase: 64 U/L (ref 39–117)
BUN: 6 mg/dL (ref 6–23)
CO2: 23 mEq/L (ref 19–32)
Calcium: 9.1 mg/dL (ref 8.4–10.5)
Chloride: 101 mEq/L (ref 96–112)
Creatinine, Ser: 0.73 mg/dL (ref 0.50–1.10)
GFR calc Af Amer: >90 mL/min (ref >90)
GFR calc non Af Amer: >90 mL/min (ref >90)
Glucose, Bld: 106 mg/dL — ABNORMAL HIGH (ref 70–99)
Potassium: 3.6 mEq/L (ref 3.5–5.1)
Sodium: 135 mEq/L (ref 135–145)
Total Bilirubin: 0.2 mg/dL — ABNORMAL LOW (ref 0.3–1.2)
Total Protein: 7.3 g/dL (ref 6.0–8.3)

## 2013-02-23 LAB — URINE MICROSCOPIC-ADD ON

## 2013-02-23 LAB — ETHANOL: Alcohol, Ethyl (B): 268 mg/dL — ABNORMAL HIGH (ref 0–11)

## 2013-02-23 MED ORDER — SODIUM CHLORIDE 0.9 % IV BOLUS (SEPSIS): 1000.0000 mL | Freq: Once | INTRAVENOUS | Status: DC

## 2013-02-23 NOTE — Discharge Instructions (Signed)
Alcohol Intoxication °Alcohol intoxication occurs when the amount of alcohol that a person has consumed impairs his or her ability to mentally and physically function. Alcohol directly impairs the normal chemical activity of the brain. Drinking large amounts of alcohol can lead to changes in mental function and behavior, and it can cause many physical effects that can be harmful.  °Alcohol intoxication can range in severity from mild to very severe. Various factors can affect the level of intoxication that occurs, such as the person's age, gender, weight, frequency of alcohol consumption, and the presence of other medical conditions (such as diabetes, seizures, or heart conditions). Dangerous levels of alcohol intoxication may occur when people drink large amounts of alcohol in a short period (binge drinking). Alcohol can also be especially dangerous when combined with certain prescription medicines or "recreational" drugs. °SIGNS AND SYMPTOMS °Some common signs and symptoms of mild alcohol intoxication include: °· Loss of coordination. °· Changes in mood and behavior. °· Impaired judgment. °· Slurred speech. °As alcohol intoxication progresses to more severe levels, other signs and symptoms will appear. These may include: °· Vomiting. °· Confusion and impaired memory. °· Slowed breathing. °· Seizures. °· Loss of consciousness. °DIAGNOSIS  °Your health care provider will take a medical history and perform a physical exam. You will be asked about the amount and type of alcohol you have consumed. Blood tests will be done to measure the concentration of alcohol in your blood. In many places, your blood alcohol level must be lower than 80 mg/dL (0.08%) to legally drive. However, many dangerous effects of alcohol can occur at much lower levels.  °TREATMENT  °People with alcohol intoxication often do not require treatment. Most of the effects of alcohol intoxication are temporary, and they go away as the alcohol naturally  leaves the body. Your health care provider will monitor your condition until you are stable enough to go home. Fluids are sometimes given through an IV access tube to help prevent dehydration.  °HOME CARE INSTRUCTIONS °· Do not drive after drinking alcohol. °· Stay hydrated. Drink enough water and fluids to keep your urine clear or pale yellow. Avoid caffeine.   °· Only take over-the-counter or prescription medicines as directed by your health care provider.   °SEEK MEDICAL CARE IF:  °· You have persistent vomiting.   °· You do not feel better after a few days. °· You have frequent alcohol intoxication. Your health care provider can help determine if you should see a substance use treatment counselor. °SEEK IMMEDIATE MEDICAL CARE IF:  °· You become shaky or tremble when you try to stop drinking.   °· You shake uncontrollably (seizure).   °· You throw up (vomit) blood. This may be bright red or may look like black coffee grounds.   °· You have blood in your stool. This may be bright red or may appear as a black, tarry, bad smelling stool.   °· You become lightheaded or faint.   °MAKE SURE YOU:  °· Understand these instructions. °· Will watch your condition. °· Will get help right away if you are not doing well or get worse. °Document Released: 01/05/2005 Document Revised: 11/28/2012 Document Reviewed: 08/31/2012 °[employer]® Patient Information ©2014 [employer]. ° °
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
## 2013-02-23 NOTE — ED Notes (Signed)
Bed: WA21 Expected date:  Expected time:  Means of arrival:  Comments: EMS Fall/facial injury/ETOH

## 2013-02-24 NOTE — ED Provider Notes (Signed)
Medical screening examination/treatment/procedure(s) were performed by non-physician practitioner and as supervising physician I was immediately available for consultation/collaboration.  EKG Interpretation   None        Raeford Razor, MD 02/24/13 [phone]

## 2013-04-09 ENCOUNTER — Other Ambulatory Visit: Payer: Self-pay | Admitting: Family Medicine

## 2013-04-17 ENCOUNTER — Other Ambulatory Visit: Payer: Medicaid Other

## 2013-04-17 DIAGNOSIS — E7800 Pure hypercholesterolemia, unspecified: Secondary | ICD-10-CM

## 2013-04-17 LAB — LIPID PANEL
Cholesterol: 277 mg/dL — ABNORMAL HIGH (ref 0–200)
HDL: 61 mg/dL (ref >39)
LDL Cholesterol: 152 mg/dL — ABNORMAL HIGH (ref 0–99)
Total CHOL/HDL Ratio: 4.5 Ratio
Triglycerides: 320 mg/dL — ABNORMAL HIGH (ref <150)
VLDL: 64 mg/dL — ABNORMAL HIGH (ref 0–40)

## 2013-04-17 NOTE — Progress Notes (Signed)
FLP DONE TODAY Jody Mcclure 

## 2013-04-18 ENCOUNTER — Ambulatory Visit: Payer: Medicaid Other | Admitting: Family Medicine

## 2013-04-25 ENCOUNTER — Encounter: Payer: Self-pay | Admitting: Family Medicine

## 2013-04-25 ENCOUNTER — Ambulatory Visit (INDEPENDENT_AMBULATORY_CARE_PROVIDER_SITE_OTHER): Payer: Medicaid Other | Admitting: Family Medicine

## 2013-04-25 VITALS — BP 154/101 | HR 97 | Temp 98.5°F | Wt 130.0 lb

## 2013-04-25 DIAGNOSIS — F3289 Other specified depressive episodes: Secondary | ICD-10-CM

## 2013-04-25 DIAGNOSIS — M25569 Pain in unspecified knee: Secondary | ICD-10-CM

## 2013-04-25 DIAGNOSIS — R1310 Dysphagia, unspecified: Secondary | ICD-10-CM

## 2013-04-25 DIAGNOSIS — E7800 Pure hypercholesterolemia, unspecified: Secondary | ICD-10-CM

## 2013-04-25 DIAGNOSIS — F329 Major depressive disorder, single episode, unspecified: Secondary | ICD-10-CM

## 2013-04-25 DIAGNOSIS — R413 Other amnesia: Secondary | ICD-10-CM

## 2013-04-25 DIAGNOSIS — F32A Depression, unspecified: Secondary | ICD-10-CM

## 2013-04-25 DIAGNOSIS — I10 Essential (primary) hypertension: Secondary | ICD-10-CM

## 2013-04-25 MED ORDER — SIMVASTATIN 40 MG PO TABS: 40.0000 mg | ORAL_TABLET | Freq: Every day | ORAL | Status: DC

## 2013-04-25 MED ORDER — DICLOFENAC SODIUM 75 MG PO TBEC: 75.0000 mg | DELAYED_RELEASE_TABLET | Freq: Two times a day (BID) | ORAL | Status: DC

## 2013-04-25 MED ORDER — VENLAFAXINE HCL 75 MG PO TABS: 75.0000 mg | ORAL_TABLET | Freq: Two times a day (BID) | ORAL | Status: DC

## 2013-04-25 NOTE — Progress Notes (Signed)
   Subjective:    Patient ID: Jody Mcclure, female    DOB: November 19, 1955, 58 y.o.   MRN: [id number]  HPI  6 months of stiffness in right knee in early am.  Notes that it is worse in am and gets better through the day.  Reports continued pain with going up and down stairs. Reports worsening memory--states she has trouble with short term memory.   Loses things easily. Cannot remember what she was just saying. Needs increase in medication for depression and anxiety.  Having break downs at times.  Review of Systems  Constitutional: Negative for fever and chills.  Respiratory: Negative for shortness of breath.   Cardiovascular: Negative for chest pain and leg swelling.  Gastrointestinal: Negative for abdominal pain.  Musculoskeletal: Positive for arthralgias, back pain and neck stiffness.       Objective:   Physical Exam  Vitals reviewed. Constitutional: She appears well-developed and well-nourished. No distress.  HENT:  Head: Normocephalic and atraumatic.  Eyes: No scleral icterus.  Neck: Neck supple.  Cardiovascular: Normal rate.   Pulmonary/Chest: Effort normal.  Abdominal: Soft. There is no tenderness.  Musculoskeletal: She exhibits tenderness.  Crepitance noted  Skin: Skin is warm.  Psychiatric: She has a normal mood and affect.          Assessment & Plan:

## 2013-04-25 NOTE — Assessment & Plan Note (Signed)
Cholesterol is too high--increase medication.

## 2013-04-25 NOTE — Assessment & Plan Note (Signed)
Likely osteoarthritis--to start Diclofenac with meals.

## 2013-04-25 NOTE — Assessment & Plan Note (Signed)
Still having crying spells.  Increase medication.

## 2013-04-25 NOTE — Assessment & Plan Note (Signed)
No signs of dementia at present.

## 2013-04-25 NOTE — Assessment & Plan Note (Signed)
Much improved with Prilosec.

## 2013-04-25 NOTE — Assessment & Plan Note (Signed)
BP ok--has been better--will not adjust medication at this time.

## 2013-04-25 NOTE — Patient Instructions (Addendum)
Generalized Anxiety Disorder Generalized anxiety disorder (GAD) is a mental disorder. It interferes with life functions, including relationships, work, and school. GAD is different from normal anxiety, which everyone experiences at some point in their lives in response to specific life events and activities. Normal anxiety actually helps us prepare for and get through these life events and activities. Normal anxiety goes away after the event or activity is over.  GAD causes anxiety that is not necessarily related to specific events or activities. It also causes excess anxiety in proportion to specific events or activities. The anxiety associated with GAD is also difficult to control. GAD can vary from mild to severe. People with severe GAD can have intense waves of anxiety with physical symptoms (panic attacks).  SYMPTOMS The anxiety and worry associated with GAD are difficult to control. This anxiety and worry are related to many life events and activities and also occur more days than not for 6 months or longer. People with GAD also have three or more of the following symptoms (one or more in children):  Restlessness.   Fatigue.  Difficulty concentrating.   Irritability.  Muscle tension.  Difficulty sleeping or unsatisfying sleep. DIAGNOSIS GAD is diagnosed through an assessment by your caregiver. Your caregiver will ask you questions aboutyour mood,physical symptoms, and events in your life. Your caregiver may ask you about your medical history and use of alcohol or drugs, including prescription medications. Your caregiver may also do a physical exam and blood tests. Certain medical conditions and the use of certain substances can cause symptoms similar to those associated with GAD. Your caregiver may refer you to a mental health specialist for further evaluation. TREATMENT The following therapies are usually used to treat GAD:   Medication Antidepressant medication usually is  prescribed for long-term daily control. Antianxiety medications may be added in severe cases, especially when panic attacks occur.   Talk therapy (psychotherapy) Certain types of talk therapy can be helpful in treating GAD by providing support, education, and guidance. A form of talk therapy called cognitive behavioral therapy can teach you healthy ways to think about and react to daily life events and activities.  Stress managementtechniques These include yoga, meditation, and exercise and can be very helpful when they are practiced regularly. A mental health specialist can help determine which treatment is best for you. Some people see improvement with one therapy. However, other people require a combination of therapies. Document Released: 07/23/2012 Document Reviewed: 07/23/2012 [facility] Patient Information 2014 [place], [place]. Fat and Cholesterol Control Diet Fat and cholesterol levels in your blood and organs are influenced by your diet. High levels of fat and cholesterol may lead to diseases of the heart, small and large blood vessels, gallbladder, liver, and pancreas. CONTROLLING FAT AND CHOLESTEROL WITH DIET Although exercise and lifestyle factors are important, your diet is key. That is because certain foods are known to raise cholesterol and others to lower it. The goal is to balance foods for their effect on cholesterol and more importantly, to replace saturated and trans fat with other types of fat, such as monounsaturated fat, polyunsaturated fat, and omega-3 fatty acids. On average, a person should consume no more than 15 to 17 g of saturated fat daily. Saturated and trans fats are considered "bad" fats, and they will raise LDL cholesterol. Saturated fats are primarily found in animal products such as meats, butter, and cream. However, that does not mean you need to give up all your favorite foods. Today, there are good tasting,  low-fat, low-cholesterol substitutes for most of the  things you like to eat. Choose low-fat or nonfat alternatives. Choose round or loin cuts of red meat. These types of cuts are lowest in fat and cholesterol. Chicken (without the skin), fish, veal, and ground [place] breast are great choices. Eliminate fatty meats, such as hot dogs and salami. Even shellfish have little or no saturated fat. Have a 3 oz (85 g) portion when you eat lean meat, poultry, or fish. Trans fats are also called "partially hydrogenated oils." They are oils that have been scientifically manipulated so that they are solid at room temperature resulting in a longer shelf life and improved taste and texture of foods in which they are added. Trans fats are found in stick margarine, some tub margarines, cookies, crackers, and baked goods.  When baking and cooking, oils are a great substitute for butter. The monounsaturated oils are especially beneficial since it is believed they lower LDL and raise HDL. The oils you should avoid entirely are saturated tropical oils, such as coconut and palm.  Remember to eat a lot from food groups that are naturally free of saturated and trans fat, including fish, fruit, vegetables, beans, grains (barley, rice, couscous, bulgur wheat), and pasta (without cream sauces).  IDENTIFYING FOODS THAT LOWER FAT AND CHOLESTEROL  Soluble fiber may lower your cholesterol. This type of fiber is found in fruits such as apples, vegetables such as broccoli, potatoes, and carrots, legumes such as beans, peas, and lentils, and grains such as barley. Foods fortified with plant sterols (phytosterol) may also lower cholesterol. You should eat at least 2 g per day of these foods for a cholesterol lowering effect.  Read package labels to identify low-saturated fats, trans fat free, and low-fat foods at the supermarket. Select cheeses that have only 2 to 3 g saturated fat per ounce. Use a heart-healthy tub margarine that is free of trans fats or partially hydrogenated oil. When buying  baked goods (cookies, crackers), avoid partially hydrogenated oils. Breads and muffins should be made from whole grains (whole-wheat or whole oat flour, instead of "flour" or "enriched flour"). Buy non-creamy canned soups with reduced salt and no added fats.  FOOD PREPARATION TECHNIQUES  Never deep-fry. If you must fry, either stir-fry, which uses very little fat, or use non-stick cooking sprays. When possible, broil, bake, or roast meats, and steam vegetables. Instead of putting butter or margarine on vegetables, use lemon and herbs, applesauce, and cinnamon (for squash and sweet potatoes). Use nonfat yogurt, salsa, and low-fat dressings for salads.  LOW-SATURATED FAT / LOW-FAT FOOD SUBSTITUTES Meats / Saturated Fat (g)  Avoid: Steak, marbled (3 oz/85 g) / 11 g  Choose: Steak, lean (3 oz/85 g) / 4 g  Avoid: Hamburger (3 oz/85 g) / 7 g  Choose: Hamburger, lean (3 oz/85 g) / 5 g  Avoid: Ham (3 oz/85 g) / 6 g  Choose: Ham, lean cut (3 oz/85 g) / 2.4 g  Avoid: Chicken, with skin, dark meat (3 oz/85 g) / 4 g  Choose: Chicken, skin removed, dark meat (3 oz/85 g) / 2 g  Avoid: Chicken, with skin, light meat (3 oz/85 g) / 2.5 g  Choose: Chicken, skin removed, light meat (3 oz/85 g) / 1 g Dairy / Saturated Fat (g)  Avoid: Whole milk (1 cup) / 5 g  Choose: Low-fat milk, 2% (1 cup) / 3 g  Choose: Low-fat milk, 1% (1 cup) / 1.5 g  Choose: Skim milk (1 cup) /  0.3 g  Avoid: Hard cheese (1 oz/28 g) / 6 g  Choose: Skim milk cheese (1 oz/28 g) / 2 to 3 g  Avoid: Cottage cheese, 4% fat (1 cup) / 6.5 g  Choose: Low-fat cottage cheese, 1% fat (1 cup) / 1.5 g  Avoid: Ice cream (1 cup) / 9 g  Choose: Sherbet (1 cup) / 2.5 g  Choose: Nonfat frozen yogurt (1 cup) / 0.3 g  Choose: Frozen fruit bar / trace  Avoid: Whipped cream (1 tbs) / 3.5 g  Choose: Nondairy whipped topping (1 tbs) / 1 g Condiments / Saturated Fat (g)  Avoid: Mayonnaise (1 tbs) / 2 g  Choose: Low-fat mayonnaise (1  tbs) / 1 g  Avoid: Butter (1 tbs) / 7 g  Choose: Extra light margarine (1 tbs) / 1 g  Avoid: Coconut oil (1 tbs) / 11.8 g  Choose: Olive oil (1 tbs) / 1.8 g  Choose: Corn oil (1 tbs) / 1.7 g  Choose: Safflower oil (1 tbs) / 1.2 g  Choose: Sunflower oil (1 tbs) / 1.4 g  Choose: Soybean oil (1 tbs) / 2.4 g  Choose: Canola oil (1 tbs) / 1 g Document Released: 03/28/2005 Document Revised: 07/23/2012 Document Reviewed: 09/16/2010 Exit[employer] Patient Information 2014 [place], [place]. Osteoarthritis Osteoarthritis is a disease that causes soreness and swelling (inflammation) of a joint. It occurs when the cartilage at the affected joint wears down. Cartilage acts as a cushion, covering the ends of bones where they meet to form a joint. Osteoarthritis is the most common form of arthritis. It often occurs in older people. The joints affected most often by this condition include those in the:  Ends of the fingers.  Thumbs.  Neck.  Lower back.  Knees.  Hips. CAUSES  Over time, the cartilage that covers the ends of bones begins to wear away. This causes bone to rub on bone, producing pain and stiffness in the affected joints.  RISK FACTORS Certain factors can increase your chances of having osteoarthritis, including:  Older age.  Excessive body weight.  Overuse of joints. SIGNS AND SYMPTOMS   Pain, swelling, and stiffness in the joint.  Over time, the joint may lose its normal shape.  Small deposits of bone (osteophytes) may grow on the edges of the joint.  Bits of bone or cartilage can break off and float inside the joint space. This may cause more pain and damage. DIAGNOSIS  Your health care provider will do a physical exam and ask about your symptoms. Various tests may be ordered, such as:  X-rays of the affected joint.  An MRI scan.  Blood tests to rule out other types of arthritis.  Joint fluid tests. This involves using a needle to draw fluid from the joint and  examining the fluid under a microscope. TREATMENT  Goals of treatment are to control pain and improve joint function. Treatment plans may include:  A prescribed exercise program that allows for rest and joint relief.  A weight control plan.  Pain relief techniques, such as:  Properly applied heat and cold.  Electric pulses delivered to nerve endings under the skin (transcutaneous electrical nerve stimulation, TENS).  Massage.  Certain nutritional supplements.  Medicines to control pain, such as:  Acetaminophen.  Nonsteroidal anti-inflammatory drugs (NSAIDs), such as naproxen.  Narcotic or central-acting agents, such as tramadol.  Corticosteroids. These can be given orally or as an injection.  Surgery to reposition the bones and relieve pain (osteotomy) or to remove loose  pieces of bone and cartilage. Joint replacement may be needed in advanced states of osteoarthritis. HOME CARE INSTRUCTIONS   Only take over-the-counter or prescription medicines as directed by your health care provider. Take all medicines exactly as instructed.  Maintain a healthy weight. Follow your health care provider's instructions for weight control. This may include dietary instructions.  Exercise as directed. Your health care provider can recommend specific types of exercise. These may include:  Strengthening exercises These are done to strengthen the muscles that support joints affected by arthritis. They can be performed with weights or with exercise bands to add resistance.  Aerobic activities These are exercises, such as brisk walking or low-impact aerobics, that get your heart pumping.  Range-of-motion activities These keep your joints limber.  Balance and agility exercises These help you maintain daily living skills.  Rest your affected joints as directed by your health care provider.  Follow up with your health care provider as directed. SEEK MEDICAL CARE IF:   Your skin turns  red.  You develop a rash in addition to your joint pain.  You have worsening joint pain. SEEK IMMEDIATE MEDICAL CARE IF:  You have a significant loss of weight or appetite.  You have a fever along with joint or muscle aches.  You have night sweats. FOR MORE INFORMATION  National Institute of Arthritis and Musculoskeletal and Skin Diseases: www.niams.http://www.myers.net/ [employer] on Aging: https://walker.com/ American College of Rheumatology: www.rheumatology.org Document Released: 03/28/2005 Document Revised: 01/16/2013 Document Reviewed: 12/03/2012 [facility] Patient Information 2014 [place], [place].

## 2013-05-09 ENCOUNTER — Emergency Department (HOSPITAL_COMMUNITY)
Admission: EM | Admit: 2013-05-09 | Discharge: 2013-05-09 | Disposition: A | Discharge status: Home / Self Care | Payer: Medicaid Other | Attending: Emergency Medicine | Admitting: Emergency Medicine

## 2013-05-09 ENCOUNTER — Encounter (HOSPITAL_COMMUNITY): Payer: Self-pay | Admitting: Emergency Medicine

## 2013-05-09 ENCOUNTER — Emergency Department (HOSPITAL_COMMUNITY): Payer: Medicaid Other

## 2013-05-09 DIAGNOSIS — J40 Bronchitis, not specified as acute or chronic: Secondary | ICD-10-CM | POA: Insufficient documentation

## 2013-05-09 DIAGNOSIS — E785 Hyperlipidemia, unspecified: Secondary | ICD-10-CM | POA: Insufficient documentation

## 2013-05-09 DIAGNOSIS — Z791 Long term (current) use of non-steroidal anti-inflammatories (NSAID): Secondary | ICD-10-CM | POA: Insufficient documentation

## 2013-05-09 DIAGNOSIS — Z79899 Other long term (current) drug therapy: Secondary | ICD-10-CM | POA: Insufficient documentation

## 2013-05-09 DIAGNOSIS — Z9089 Acquired absence of other organs: Secondary | ICD-10-CM | POA: Insufficient documentation

## 2013-05-09 DIAGNOSIS — I10 Essential (primary) hypertension: Secondary | ICD-10-CM | POA: Insufficient documentation

## 2013-05-09 HISTORY — DX: Essential (primary) hypertension: I10

## 2013-05-09 LAB — CBC
HCT: 40.7 % (ref 36.0–46.0)
Hemoglobin: 14.2 g/dL (ref 12.0–15.0)
MCH: 32.9 pg (ref 26.0–34.0)
MCHC: 34.9 g/dL (ref 30.0–36.0)
MCV: 94.4 fL (ref 78.0–100.0)
Platelets: 276 10*3/uL (ref 150–400)
RBC: 4.31 MIL/uL (ref 3.87–5.11)
RDW: 13.1 % (ref 11.5–15.5)
WBC: 6.4 10*3/uL (ref 4.0–10.5)

## 2013-05-09 LAB — BASIC METABOLIC PANEL
BUN: 6 mg/dL (ref 6–23)
CO2: 27 mEq/L (ref 19–32)
Calcium: 8.9 mg/dL (ref 8.4–10.5)
Chloride: 105 mEq/L (ref 96–112)
Creatinine, Ser: 0.71 mg/dL (ref 0.50–1.10)
GFR calc Af Amer: >90 mL/min (ref >90)
GFR calc non Af Amer: >90 mL/min (ref >90)
Glucose, Bld: 106 mg/dL — ABNORMAL HIGH (ref 70–99)
Potassium: 3.8 mEq/L (ref 3.7–5.3)
Sodium: 145 mEq/L (ref 137–147)

## 2013-05-09 MED ORDER — ALBUTEROL SULFATE HFA 108 (90 BASE) MCG/ACT IN AERS
2.0000 | INHALATION_SPRAY | RESPIRATORY_TRACT | Status: DC | PRN
  Administered 2013-05-09: 2 via RESPIRATORY_TRACT
  Filled 2013-05-09: qty 6.7

## 2013-05-09 MED ORDER — BENZONATATE 100 MG PO CAPS: 100.0000 mg | ORAL_CAPSULE | Freq: Three times a day (TID) | ORAL | Status: DC

## 2013-05-09 NOTE — ED Notes (Signed)
Pt reports having productive cough with yellow sputum. Causing sob and now chest tightness. ekg done at triage.

## 2013-05-09 NOTE — Discharge Instructions (Signed)

## 2013-05-09 NOTE — ED Notes (Signed)
Respiratory at bedside.  Pt instructed on how to use the inhaler with return demonstration.  Pt given a spacer to take home.

## 2013-05-09 NOTE — ED Notes (Signed)
Pt verbalized understanding of discharge instructions and the need for follow up care.  Pt understood medication administration and showed return demonstration prior to discharge.

## 2013-05-09 NOTE — ED Notes (Signed)
Pt returned from xray

## 2013-05-09 NOTE — ED Provider Notes (Signed)
CSN: [id number]     Arrival date & time 05/09/13  4098 History   First MD Initiated Contact with Patient 05/09/13 1012     Chief Complaint  Patient presents with  . Cough   (Consider location/radiation/quality/duration/timing/severity/associated sxs/prior Treatment) Patient is a 58 y.o. female presenting with cough.  Cough  Pt reports several days of cough productive of yellow sputum, worsening since yesterday with wheezing/SOB and tightness with cough and deep breath. Denies fever. No N/V/D Has had PNA and bronchitis before. Has not been to PCP for same. Taking OTC meds with some improvement.   Past Medical History  Diagnosis Date  . Hyperlipidemia   . Hypertension    Past Surgical History  Procedure Laterality Date  . Abdominal hysterectomy    . Tonsillectomy     Family History  Problem Relation Age of Onset  . Cancer Mother     lung  . Hyperlipidemia Mother   . Hypertension Father   . Hyperlipidemia Father   . Colon cancer Neg Hx   . Colon polyps Neg Hx   . Rectal cancer Neg Hx   . Stomach cancer Neg Hx    History  Substance Use Topics  . Smoking status: Never Smoker   . Smokeless tobacco: Never Used  . Alcohol Use: 1.2 oz/week    2 Cans of beer per week     Comment: 1 daily   OB History   Grav Para Term Preterm Abortions TAB SAB Ect Mult Living                 Review of Systems  Respiratory: Positive for cough.    All other systems reviewed and are negative except as noted in HPI.   Allergies  Codeine and Sulfa antibiotics  Home Medications   Current Outpatient Rx  Name  Route  Sig  Dispense  Refill  . dextromethorphan-guaiFENesin (MUCINEX DM) 30-600 MG per 12 hr tablet   Oral   Take 2 tablets by mouth 2 (two) times daily as needed for cough.         Marland Kitchen ibuprofen (ADVIL,MOTRIN) 200 MG tablet   Oral   Take 400 mg by mouth 2 (two) times daily as needed (pain).         Marland Kitchen lisinopril (PRINIVIL,ZESTRIL) 10 MG tablet   Oral   Take 10 mg by mouth  daily.         Marland Kitchen omeprazole (PRILOSEC) 20 MG capsule   Oral   Take 20 mg by mouth daily.         Marland Kitchen PRESCRIPTION MEDICATION   Topical   Apply 1 application topically 2 (two) times daily. To arms, legs and back. Steroidal topical ointmen         . simvastatin (ZOCOR) 40 MG tablet   Oral   Take 1 tablet (40 mg total) by mouth daily.   30 tablet   3   . venlafaxine (EFFEXOR) 37.5 MG tablet   Oral   Take 37.5 mg by mouth 2 (two) times daily.         . diclofenac (VOLTAREN) 75 MG EC tablet   Oral   Take 1 tablet (75 mg total) by mouth 2 (two) times daily.   30 tablet   0     Take with food    BP 132/77  Pulse 93  Temp(Src) 98.7 F (37.1 C) (Oral)  Resp 24  SpO2 94% Physical Exam  Nursing note and vitals reviewed. Constitutional: She is oriented  to person, place, and time. She appears well-developed and well-nourished.  HENT:  Head: Normocephalic and atraumatic.  Eyes: EOM are normal. Pupils are equal, round, and reactive to light.  Neck: Normal range of motion. Neck supple.  Cardiovascular: Normal rate, normal heart sounds and intact distal pulses.   Pulmonary/Chest: Effort normal. No respiratory distress. She has wheezes. She has no rales.  Abdominal: Bowel sounds are normal. She exhibits no distension. There is no tenderness.  Musculoskeletal: Normal range of motion. She exhibits no edema and no tenderness.  Neurological: She is alert and oriented to person, place, and time. She has normal strength. No cranial nerve deficit or sensory deficit.  Skin: Skin is warm and dry. No rash noted.  Psychiatric: She has a normal mood and affect.    ED Course  Procedures (including critical care time) Labs Review Labs Reviewed  BASIC METABOLIC PANEL - Abnormal; Notable for the following:    Glucose, Bld 106 (*)    All other components within normal limits  CBC   Imaging Review Dg Chest 2 View  05/09/2013   CLINICAL DATA:  Chest congestion cough shortness of breath   EXAM: CHEST  2 VIEW  COMPARISON:  08/08/2009  FINDINGS: The heart size and vascular pattern are normal. There is no infiltrate or consolidation. Multiple mild superolateral left rib deformities appear to be related to probable prior rib trauma. No pleural effusion or pneumothorax.  IMPRESSION: No active cardiopulmonary disease.   Electronically Signed   By: Esperanza Heiraymond  Rubner M.D.   On: 05/09/2013 10:41    EKG Interpretation    Date/Time:  Thursday May 09 2013 09:57:23 EST Ventricular Rate:  97 PR Interval:  132 QRS Duration: 82 QT Interval:  378 QTC Calculation: 480 R Axis:   -51 Text Interpretation:  Normal sinus rhythm Right atrial enlargement Left axis deviation Nonspecific ST and T wave abnormality Prolonged QT Abnormal ECG No old tracing to compare Confirmed by [facility] RogersHELDON  MD, Leslie Jester ([id number]) on 05/09/2013 10:12:25 AM            MDM   1. Bronchitis     CXR clear, likely viral bronchitis. Given HFA, cough medicine and advised PCP followup if not improving.     Thaniel Coluccio B. Bernette MayersSheldon, MD 05/09/13 1213

## 2013-06-14 ENCOUNTER — Other Ambulatory Visit: Payer: Self-pay | Admitting: Family Medicine

## 2013-10-28 ENCOUNTER — Ambulatory Visit: Payer: Medicaid Other | Admitting: Family Medicine

## 2014-02-07 ENCOUNTER — Other Ambulatory Visit: Payer: Medicaid Other

## 2014-06-23 ENCOUNTER — Ambulatory Visit: Payer: Medicaid Other | Admitting: Family Medicine

## 2014-08-05 ENCOUNTER — Ambulatory Visit (INDEPENDENT_AMBULATORY_CARE_PROVIDER_SITE_OTHER): Payer: Medicaid Other | Admitting: Family Medicine

## 2014-08-05 ENCOUNTER — Encounter: Payer: Self-pay | Admitting: Family Medicine

## 2014-08-05 ENCOUNTER — Other Ambulatory Visit: Payer: Self-pay | Admitting: Family Medicine

## 2014-08-05 VITALS — BP 129/83 | HR 84 | Temp 98.7°F | Ht 60.0 in | Wt 110.9 lb

## 2014-08-05 DIAGNOSIS — R634 Abnormal weight loss: Secondary | ICD-10-CM

## 2014-08-05 DIAGNOSIS — R21 Rash and other nonspecific skin eruption: Secondary | ICD-10-CM | POA: Diagnosis not present

## 2014-08-05 DIAGNOSIS — I10 Essential (primary) hypertension: Secondary | ICD-10-CM

## 2014-08-05 DIAGNOSIS — R1310 Dysphagia, unspecified: Secondary | ICD-10-CM | POA: Diagnosis not present

## 2014-08-05 DIAGNOSIS — F32A Depression, unspecified: Secondary | ICD-10-CM

## 2014-08-05 DIAGNOSIS — E7800 Pure hypercholesterolemia, unspecified: Secondary | ICD-10-CM

## 2014-08-05 DIAGNOSIS — Z1231 Encounter for screening mammogram for malignant neoplasm of breast: Secondary | ICD-10-CM

## 2014-08-05 DIAGNOSIS — F329 Major depressive disorder, single episode, unspecified: Secondary | ICD-10-CM

## 2014-08-05 LAB — COMPREHENSIVE METABOLIC PANEL
ALT: 23 U/L (ref 0–35)
AST: 35 U/L (ref 0–37)
Albumin: 3.9 g/dL (ref 3.5–5.2)
Alkaline Phosphatase: 80 U/L (ref 39–117)
BUN: 7 mg/dL (ref 6–23)
CO2: 24 mEq/L (ref 19–32)
Calcium: 9.4 mg/dL (ref 8.4–10.5)
Chloride: 105 mEq/L (ref 96–112)
Creat: 0.73 mg/dL (ref 0.50–1.10)
Glucose, Bld: 99 mg/dL (ref 70–99)
Potassium: 4.1 mEq/L (ref 3.5–5.3)
Sodium: 139 mEq/L (ref 135–145)
Total Bilirubin: 0.8 mg/dL (ref 0.2–1.2)
Total Protein: 7.2 g/dL (ref 6.0–8.3)

## 2014-08-05 LAB — CBC
HCT: 44 % (ref 36.0–46.0)
Hemoglobin: 15 g/dL (ref 12.0–15.0)
MCH: 31.9 pg (ref 26.0–34.0)
MCHC: 34.1 g/dL (ref 30.0–36.0)
MCV: 93.6 fL (ref 78.0–100.0)
MPV: 10.1 fL (ref 8.6–12.4)
Platelets: 287 10*3/uL (ref 150–400)
RBC: 4.7 MIL/uL (ref 3.87–5.11)
RDW: 13.8 % (ref 11.5–15.5)
WBC: 6.8 10*3/uL (ref 4.0–10.5)

## 2014-08-05 LAB — LIPID PANEL
Cholesterol: 253 mg/dL — ABNORMAL HIGH (ref 0–200)
HDL: 54 mg/dL (ref >=46)
LDL Cholesterol: 167 mg/dL — ABNORMAL HIGH (ref 0–99)
Total CHOL/HDL Ratio: 4.7 Ratio
Triglycerides: 158 mg/dL — ABNORMAL HIGH (ref <150)
VLDL: 32 mg/dL (ref 0–40)

## 2014-08-05 LAB — TSH: TSH: 1.276 u[IU]/mL (ref 0.350–4.500)

## 2014-08-05 MED ORDER — SIMVASTATIN 40 MG PO TABS: 40.0000 mg | ORAL_TABLET | Freq: Every day | ORAL | Status: DC

## 2014-08-05 MED ORDER — METHYLPREDNISOLONE ACETATE 80 MG/ML IJ SUSP
80.0000 mg | Freq: Once | INTRAMUSCULAR | Status: AC
  Administered 2014-08-05: 80 mg via INTRAMUSCULAR

## 2014-08-05 MED ORDER — VENLAFAXINE HCL 37.5 MG PO TABS: 37.5000 mg | ORAL_TABLET | Freq: Two times a day (BID) | ORAL | Status: DC

## 2014-08-05 NOTE — Assessment & Plan Note (Signed)
BP at goal with weight loss and on no meds--may need to resume, depending.

## 2014-08-05 NOTE — Assessment & Plan Note (Signed)
Improved after dilation--no further symptoms.

## 2014-08-05 NOTE — Assessment & Plan Note (Signed)
Will restart her Effexor--and increase as needed.  May help with rash as well, if there is a nervous component to it.

## 2014-08-05 NOTE — Assessment & Plan Note (Signed)
Trial of Effexor and IM Depo-Medrol--which should help--may need daily anti-histamine.

## 2014-08-05 NOTE — Patient Instructions (Signed)

## 2014-08-05 NOTE — Progress Notes (Signed)
Subjective:    Patient ID: Jody Mcclure is a 59 y.o. female presenting with Anxiety  on 08/05/2014  HPI: She is here today for a disseminated rash.  Previous w/u has included seeing Dermatology.  She got some steroid injections which helps with the itch.  She thinks and I believe dermatology thought it might be her nerves. Not taking any medication at present. First time in in more than 1 year. Reports 20 lb weight loss.  Loss of appetite. Reports a lot of anxiety. Colonoscopy 1 year ago. Last mammogram unknown. Never a heavy smoker.  Review of Systems  Constitutional: Negative for fever and chills.       Night sweats  Respiratory: Positive for shortness of breath.   Cardiovascular: Negative for chest pain.  Gastrointestinal: Positive for diarrhea. Negative for nausea, vomiting and abdominal pain.  Endocrine: Positive for polydipsia and polyphagia.  Genitourinary: Negative for dysuria.  Skin: Negative for rash.      Objective:    BP 129/83 mmHg  Pulse 84  Temp(Src) 98.7 F (37.1 C) (Oral)  Ht 5' (1.524 m)  Wt 110 lb 14.4 oz (50.304 kg)  BMI 21.66 kg/m2 Physical Exam  Constitutional: She is oriented to person, place, and time. She appears well-developed and well-nourished. No distress.  HENT:  Head: Normocephalic and atraumatic.  Eyes: No scleral icterus.  Neck: Neck supple. No thyromegaly present.  Cardiovascular: Normal rate and regular rhythm.   No murmur heard. Pulmonary/Chest: Effort normal and breath sounds normal. No respiratory distress. She has no wheezes. She has no rales.  Abdominal: Soft. She exhibits no mass. There is no tenderness. There is no guarding.  Musculoskeletal: Normal range of motion. She exhibits no edema.  Lymphadenopathy:    She has no cervical adenopathy.  Neurological: She is alert and oriented to person, place, and time.  Skin: Skin is warm and dry. Rash (multiple excoriated lesions on forearms and legs. Few lesions on trunk.) noted.    Psychiatric: She has a normal mood and affect.        Assessment & Plan:   Problem List Items Addressed This Visit      Unprioritized   Hypertension    BP at goal with weight loss and on no meds--may need to resume, depending.      Relevant Medications   simvastatin (ZOCOR) 40 MG tablet   Rash and nonspecific skin eruption    Trial of Effexor and IM Depo-Medrol--which should help--may need daily anti-histamine.      Relevant Medications   venlafaxine (EFFEXOR) 37.5 MG tablet   Hypercholesteremia   Relevant Medications   simvastatin (ZOCOR) 40 MG tablet   Other Relevant Orders   Lipid panel   Dysphagia    Improved after dilation--no further symptoms.      Depression    Will restart her Effexor--and increase as needed.  May help with rash as well, if there is a nervous component to it.      Relevant Medications   venlafaxine (EFFEXOR) 37.5 MG tablet   Loss of weight - Primary    Given extent of weight loss--will check mammogram, has had colonoscopy in last 2 years.  She has never been a heavy smoker and lacks symptoms. We will run a blood panel and check HIV.      Relevant Orders   CBC   Comprehensive metabolic panel   TSH   HIV antibody      Return in about 4 weeks (around 09/02/2014).  Demontrez Rindfleisch S 08/05/2014 2:33 PM

## 2014-08-05 NOTE — Assessment & Plan Note (Signed)
Given extent of weight loss--will check mammogram, has had colonoscopy in last 2 years.  She has never been a heavy smoker and lacks symptoms. We will run a blood panel and check HIV.

## 2014-08-06 ENCOUNTER — Encounter: Payer: Self-pay | Admitting: Family Medicine

## 2014-08-06 LAB — HIV ANTIBODY (ROUTINE TESTING W REFLEX): HIV 1&2 Ab, 4th Generation: NONREACTIVE

## 2014-08-14 ENCOUNTER — Ambulatory Visit: Payer: Medicaid Other

## 2014-08-20 ENCOUNTER — Ambulatory Visit: Payer: Medicaid Other

## 2014-09-23 ENCOUNTER — Ambulatory Visit: Payer: Medicaid Other

## 2014-10-07 ENCOUNTER — Encounter (HOSPITAL_COMMUNITY): Payer: Self-pay | Admitting: *Deleted

## 2014-10-07 ENCOUNTER — Emergency Department (HOSPITAL_COMMUNITY)
Admission: EM | Admit: 2014-10-07 | Discharge: 2014-10-07 | Disposition: A | Discharge status: Home / Self Care | Payer: Medicaid Other | Attending: Emergency Medicine | Admitting: Emergency Medicine

## 2014-10-07 ENCOUNTER — Emergency Department (HOSPITAL_COMMUNITY): Payer: Medicaid Other

## 2014-10-07 DIAGNOSIS — Z720 Tobacco use: Secondary | ICD-10-CM | POA: Diagnosis not present

## 2014-10-07 DIAGNOSIS — Z79899 Other long term (current) drug therapy: Secondary | ICD-10-CM | POA: Diagnosis not present

## 2014-10-07 DIAGNOSIS — F1010 Alcohol abuse, uncomplicated: Secondary | ICD-10-CM | POA: Insufficient documentation

## 2014-10-07 DIAGNOSIS — Y999 Unspecified external cause status: Secondary | ICD-10-CM | POA: Diagnosis not present

## 2014-10-07 DIAGNOSIS — Y92410 Unspecified street and highway as the place of occurrence of the external cause: Secondary | ICD-10-CM | POA: Diagnosis not present

## 2014-10-07 DIAGNOSIS — Z23 Encounter for immunization: Secondary | ICD-10-CM | POA: Diagnosis not present

## 2014-10-07 DIAGNOSIS — Y939 Activity, unspecified: Secondary | ICD-10-CM | POA: Insufficient documentation

## 2014-10-07 DIAGNOSIS — Y929 Unspecified place or not applicable: Secondary | ICD-10-CM | POA: Diagnosis not present

## 2014-10-07 DIAGNOSIS — W19XXXA Unspecified fall, initial encounter: Secondary | ICD-10-CM

## 2014-10-07 DIAGNOSIS — S0181XA Laceration without foreign body of other part of head, initial encounter: Secondary | ICD-10-CM

## 2014-10-07 DIAGNOSIS — S01112A Laceration without foreign body of left eyelid and periocular area, initial encounter: Secondary | ICD-10-CM | POA: Insufficient documentation

## 2014-10-07 DIAGNOSIS — I10 Essential (primary) hypertension: Secondary | ICD-10-CM | POA: Diagnosis not present

## 2014-10-07 DIAGNOSIS — E119 Type 2 diabetes mellitus without complications: Secondary | ICD-10-CM | POA: Diagnosis not present

## 2014-10-07 DIAGNOSIS — X58XXXA Exposure to other specified factors, initial encounter: Secondary | ICD-10-CM | POA: Insufficient documentation

## 2014-10-07 DIAGNOSIS — E785 Hyperlipidemia, unspecified: Secondary | ICD-10-CM | POA: Insufficient documentation

## 2014-10-07 DIAGNOSIS — F329 Major depressive disorder, single episode, unspecified: Secondary | ICD-10-CM | POA: Diagnosis not present

## 2014-10-07 HISTORY — DX: Major depressive disorder, single episode, unspecified: F32.9

## 2014-10-07 HISTORY — DX: Depression, unspecified: F32.A

## 2014-10-07 LAB — CBG MONITORING, ED: Glucose-Capillary: 76 mg/dL (ref 65–99)

## 2014-10-07 MED ORDER — TETANUS-DIPHTH-ACELL PERTUSSIS 5-2.5-18.5 LF-MCG/0.5 IM SUSP
0.5000 mL | Freq: Once | INTRAMUSCULAR | Status: AC
  Administered 2014-10-07: 0.5 mL via INTRAMUSCULAR
  Filled 2014-10-07: qty 0.5

## 2014-10-07 MED ORDER — LIDOCAINE-EPINEPHRINE (PF) 2 %-1:200000 IJ SOLN
20.0000 mL | Freq: Once | INTRAMUSCULAR | Status: AC
  Administered 2014-10-07: 20 mL via INTRADERMAL
  Filled 2014-10-07: qty 20

## 2014-10-07 NOTE — ED Notes (Signed)
Patient admits to drinking one 24 oz beer approxiamately 2 hrs ago, patient does not remember falling.  Patient states when she woke she was outside. Denies pain. Puncture/laceration noted to left forehead.

## 2014-10-07 NOTE — ED Notes (Signed)
Bed: [id number] Expected date: 10/07/14 Expected time: 6:23 AM Means of arrival: Ambulance Comments: Head laceration, fall, loss of loc

## 2014-10-07 NOTE — Discharge Instructions (Signed)
You have absorbable sutures in your L brow, they should come out on their own, but if they are still in after 10 days, see a physician to have them removed. Facial Laceration  A facial laceration is a cut on the face. These injuries can be painful and cause bleeding. Lacerations usually heal quickly, but they need special care to reduce scarring. DIAGNOSIS  Your health care provider will take a medical history, ask for details about how the injury occurred, and examine the wound to determine how deep the cut is. TREATMENT  Some facial lacerations may not require closure. Others may not be able to be closed because of an increased risk of infection. The risk of infection and the chance for successful closure will depend on various factors, including the amount of time since the injury occurred. The wound may be cleaned to help prevent infection. If closure is appropriate, pain medicines may be given if needed. Your health care provider will use stitches (sutures), wound glue (adhesive), or skin adhesive strips to repair the laceration. These tools bring the skin edges together to allow for faster healing and a better cosmetic outcome. If needed, you may also be given a tetanus shot. HOME CARE INSTRUCTIONS  Only take over-the-counter or prescription medicines as directed by your health care provider.  Follow your health care provider's instructions for wound care. These instructions will vary depending on the technique used for closing the wound. For Sutures:  Keep the wound clean and dry.   If you were given a bandage (dressing), you should change it at least once a day. Also change the dressing if it becomes wet or dirty, or as directed by your health care provider.   Wash the wound with soap and water 2 times a day. Rinse the wound off with water to remove all soap. Pat the wound dry with a clean towel.   After cleaning, apply a thin layer of the antibiotic ointment recommended by your  health care provider. This will help prevent infection and keep the dressing from sticking.   You may shower as usual after the first 24 hours. Do not soak the wound in water until the sutures are removed.   Get your sutures removed as directed by your health care provider. With facial lacerations, sutures should usually be taken out after 4-5 days to avoid stitch marks.   Wait a few days after your sutures are removed before applying any makeup. For Skin Adhesive Strips:  Keep the wound clean and dry.   Do not get the skin adhesive strips wet. You may bathe carefully, using caution to keep the wound dry.   If the wound gets wet, pat it dry with a clean towel.   Skin adhesive strips will fall off on their own. You may trim the strips as the wound heals. Do not remove skin adhesive strips that are still stuck to the wound. They will fall off in time.  For Wound Adhesive:  You may briefly wet your wound in the shower or bath. Do not soak or scrub the wound. Do not swim. Avoid periods of heavy sweating until the skin adhesive has fallen off on its own. After showering or bathing, gently pat the wound dry with a clean towel.   Do not apply liquid medicine, cream medicine, ointment medicine, or makeup to your wound while the skin adhesive is in place. This may loosen the film before your wound is healed.   If a dressing is placed  over the wound, be careful not to apply tape directly over the skin adhesive. This may cause the adhesive to be pulled off before the wound is healed.   Avoid prolonged exposure to sunlight or tanning lamps while the skin adhesive is in place.  The skin adhesive will usually remain in place for 5-10 days, then naturally fall off the skin. Do not pick at the adhesive film.  After Healing: Once the wound has healed, cover the wound with sunscreen during the day for 1 full year. This can help minimize scarring. Exposure to ultraviolet light in the first year  will darken the scar. It can take 1-2 years for the scar to lose its redness and to heal completely.  SEEK IMMEDIATE MEDICAL CARE IF:  You have redness, pain, or swelling around the wound.   You see ayellowish-white fluid (pus) coming from the wound.   You have chills or a fever.  MAKE SURE YOU:  Understand these instructions.  Will watch your condition.  Will get help right away if you are not doing well or get worse. Document Released: 05/05/2004 Document Revised: 01/16/2013 Document Reviewed: 11/08/2012 [facility] Patient Information 2015 [place], [place]. This information is not intended to replace advice given to you by your health care provider. Make sure you discuss any questions you have with your health care provider.

## 2014-10-07 NOTE — ED Notes (Signed)
Per EMS police was notified by a bystander whom saw patient laying in street. When police arrive patient walking down the street and they noticed head laceration. Patient does not remeber falling denies abuse per EMS.  EMS call by [employer] patient brought in for evaluation. EMS states patient has been drinking.

## 2014-10-07 NOTE — ED Provider Notes (Signed)
CSN: [id number]     Arrival date & time 10/07/14  [id number] History   First MD Initiated Contact with Patient 10/07/14 [phone]     Chief Complaint  Patient presents with  . Fall    head laceration left side of forehead      (Consider location/radiation/quality/duration/timing/severity/associated sxs/prior Treatment) Patient is a 59 y.o. female presenting with scalp laceration.  Head Laceration This is a new problem. The current episode started 6 to 12 hours ago. The problem occurs constantly. The problem has not changed since onset.Associated symptoms include headaches. Pertinent negatives include no chest pain, no abdominal pain and no shortness of breath. Nothing aggravates the symptoms. Nothing relieves the symptoms. She has tried nothing for the symptoms.    Past Medical History  Diagnosis Date  . Hyperlipidemia   . Hypertension   . Depression    Past Surgical History  Procedure Laterality Date  . Abdominal hysterectomy    . Tonsillectomy     Family History  Problem Relation Age of Onset  . Cancer Mother     lung  . Hyperlipidemia Mother   . Hypertension Father   . Hyperlipidemia Father   . Colon cancer Neg Hx   . Colon polyps Neg Hx   . Rectal cancer Neg Hx   . Stomach cancer Neg Hx    History  Substance Use Topics  . Smoking status: Current Some Day Smoker  . Smokeless tobacco: Never Used  . Alcohol Use: 1.2 oz/week    2 Cans of beer per week     Comment: 1 daily   OB History    Gravida Para Term Preterm AB TAB SAB Ectopic Multiple Living   2         2     Review of Systems  Respiratory: Negative for shortness of breath.   Cardiovascular: Negative for chest pain.  Gastrointestinal: Negative for abdominal pain.  Neurological: Positive for headaches.  All other systems reviewed and are negative.     Allergies  Codeine and Sulfa antibiotics  Home Medications   Prior to Admission medications   Medication Sig Start Date End Date Taking? Authorizing  Provider  ibuprofen (ADVIL,MOTRIN) 200 MG tablet Take 400 mg by mouth 2 (two) times daily as needed (pain).    Historical Provider, MD  lisinopril (PRINIVIL,ZESTRIL) 10 MG tablet Take 10 mg by mouth daily.    Historical Provider, MD  PRESCRIPTION MEDICATION Apply 1 application topically 2 (two) times daily. To arms, legs and back. Steroidal topical ointmen    Historical Provider, MD  simvastatin (ZOCOR) 40 MG tablet Take 1 tablet (40 mg total) by mouth daily. 08/05/14   Reva Boresanya S Pratt, MD  venlafaxine (EFFEXOR) 37.5 MG tablet Take 1 tablet (37.5 mg total) by mouth 2 (two) times daily. 08/05/14   Reva Boresanya S Pratt, MD   BP 129/76 mmHg  Pulse 66  Temp(Src) 97.4 F (36.3 C) (Oral)  Resp 17  Ht 5' (1.524 m)  Wt 110 lb (49.896 kg)  BMI 21.48 kg/m2  SpO2 95% Physical Exam  Constitutional: She is oriented to person, place, and time. She appears well-developed and well-nourished.  HENT:  Head: Normocephalic.  Right Ear: External ear normal.  Left Ear: External ear normal.  2 cm laceration to L brow  Eyes: Conjunctivae and EOM are normal. Pupils are equal, round, and reactive to light.  Neck: Normal range of motion. Neck supple.  Cardiovascular: Normal rate, regular rhythm, normal heart sounds and intact distal pulses.  Pulmonary/Chest: Effort normal and breath sounds normal.  Abdominal: Soft. Bowel sounds are normal. There is no tenderness.  Musculoskeletal: Normal range of motion.  Neurological: She is alert and oriented to person, place, and time.  Skin: Skin is warm and dry.  Vitals reviewed.   ED Course  LACERATION REPAIR Date/Time: 10/07/2014 8:25 AM Performed by: Mirian Mo Authorized by: Mirian Mo Consent: Verbal consent obtained. Body area: head/neck Location details: left eyebrow Laceration length: 3 cm Tendon involvement: none Nerve involvement: none Vascular damage: no Anesthesia: local infiltration Local anesthetic: lidocaine 2% with epinephrine Anesthetic  total: 7 ml Patient sedated: no Preparation: Patient was prepped and draped in the usual sterile fashion. Irrigation solution: saline Irrigation method: syringe Amount of cleaning: standard Debridement: none Degree of undermining: none Wound skin closure material used: 5-0 vicryl rapide. Number of sutures: 3 Technique: simple Approximation: close Approximation difficulty: simple Patient tolerance: Patient tolerated the procedure well with no immediate complications   (including critical care time) Labs Review Labs Reviewed  CBG MONITORING, ED  CBG MONITORING, ED    Imaging Review Ct Head Wo Contrast  10/07/2014   CLINICAL DATA:  Found on ground with left facial laceration, initial encounter  EXAM: CT HEAD WITHOUT CONTRAST  TECHNIQUE: Contiguous axial images were obtained from the base of the skull through the vertex without intravenous contrast.  COMPARISON:  02/23/2013  FINDINGS: Bony calvarium is intact. Mild atrophic changes are noted. No acute hemorrhage, acute infarction or space-occupying mass lesion is identified.  IMPRESSION: Mild atrophic changes without acute abnormality.   Electronically Signed   By: Alcide Clever M.D.   On: 10/07/2014 07:47     EKG Interpretation   Date/Time:  Tuesday October 07 2014 06:42:07 EDT Ventricular Rate:  69 PR Interval:  165 QRS Duration: 100 QT Interval:  456 QTC Calculation: 489 R Axis:   -17 Text Interpretation:  Sinus rhythm Borderline left axis deviation  Borderline T abnormalities, anterior leads Borderline prolonged QT  interval No significant change since last tracing Confirmed by Mirian Mo [phone]) on 10/07/2014 7:00:50 AM      MDM   Final diagnoses:  Laceration of brow without complication, initial encounter  Fall, initial encounter    59 y.o. female with pertinent PMH of HTN, DM presents with laceration to L brow after unknown circumstances fall while intoxicated.  Admits to ETOH use.  Physical exam on arrival  without focal neuro deficits, no signs of trauma elsewhere.  Pt awakens easily and responds quickly to questions. Wu unremarkable.  Tdap updated.  Repaired lac as above.  DC home when clinically sober.  I have reviewed all laboratory and imaging studies if ordered as above  1. Laceration of brow without complication, initial encounter   2. Fall, initial encounter         Mirian Mo, MD 10/07/14 [phone]

## 2014-10-29 ENCOUNTER — Ambulatory Visit: Payer: Medicaid Other

## 2014-12-14 ENCOUNTER — Other Ambulatory Visit: Payer: Self-pay | Admitting: Family Medicine

## 2014-12-17 ENCOUNTER — Other Ambulatory Visit: Payer: Self-pay | Admitting: Family Medicine

## 2015-04-01 ENCOUNTER — Other Ambulatory Visit: Payer: Self-pay | Admitting: Family Medicine

## 2015-05-19 ENCOUNTER — Ambulatory Visit: Payer: Medicaid Other

## 2015-05-28 ENCOUNTER — Ambulatory Visit: Payer: Medicaid Other

## 2015-08-31 ENCOUNTER — Emergency Department (HOSPITAL_COMMUNITY): Payer: Medicaid Other

## 2015-08-31 ENCOUNTER — Emergency Department (HOSPITAL_COMMUNITY)
Admission: EM | Admit: 2015-08-31 | Discharge: 2015-08-31 | Disposition: A | Discharge status: Home / Self Care | Payer: Medicaid Other | Attending: Emergency Medicine | Admitting: Emergency Medicine

## 2015-08-31 ENCOUNTER — Encounter (HOSPITAL_COMMUNITY): Payer: Self-pay | Admitting: Emergency Medicine

## 2015-08-31 DIAGNOSIS — F17200 Nicotine dependence, unspecified, uncomplicated: Secondary | ICD-10-CM | POA: Diagnosis not present

## 2015-08-31 DIAGNOSIS — I10 Essential (primary) hypertension: Secondary | ICD-10-CM | POA: Insufficient documentation

## 2015-08-31 DIAGNOSIS — Z79899 Other long term (current) drug therapy: Secondary | ICD-10-CM | POA: Insufficient documentation

## 2015-08-31 DIAGNOSIS — R059 Cough, unspecified: Secondary | ICD-10-CM

## 2015-08-31 DIAGNOSIS — F329 Major depressive disorder, single episode, unspecified: Secondary | ICD-10-CM | POA: Insufficient documentation

## 2015-08-31 DIAGNOSIS — R05 Cough: Secondary | ICD-10-CM | POA: Diagnosis not present

## 2015-08-31 DIAGNOSIS — E785 Hyperlipidemia, unspecified: Secondary | ICD-10-CM | POA: Insufficient documentation

## 2015-08-31 DIAGNOSIS — R079 Chest pain, unspecified: Secondary | ICD-10-CM | POA: Diagnosis present

## 2015-08-31 LAB — BASIC METABOLIC PANEL
Anion gap: 8 (ref 5–15)
BUN: <5 mg/dL — ABNORMAL LOW (ref 6–20)
CO2: 27 mmol/L (ref 22–32)
Calcium: 9.5 mg/dL (ref 8.9–10.3)
Chloride: 104 mmol/L (ref 101–111)
Creatinine, Ser: 0.54 mg/dL (ref 0.44–1.00)
GFR calc Af Amer: >60 mL/min (ref >60)
GFR calc non Af Amer: >60 mL/min (ref >60)
Glucose, Bld: 101 mg/dL — ABNORMAL HIGH (ref 65–99)
Potassium: 3.7 mmol/L (ref 3.5–5.1)
Sodium: 139 mmol/L (ref 135–145)

## 2015-08-31 LAB — I-STAT TROPONIN, ED: Troponin i, poc: 0 ng/mL (ref 0.00–0.08)

## 2015-08-31 LAB — CBC
HCT: 47.5 % — ABNORMAL HIGH (ref 36.0–46.0)
Hemoglobin: 15.5 g/dL — ABNORMAL HIGH (ref 12.0–15.0)
MCH: 30.5 pg (ref 26.0–34.0)
MCHC: 32.6 g/dL (ref 30.0–36.0)
MCV: 93.3 fL (ref 78.0–100.0)
Platelets: 278 10*3/uL (ref 150–400)
RBC: 5.09 MIL/uL (ref 3.87–5.11)
RDW: 13.7 % (ref 11.5–15.5)
WBC: 5 10*3/uL (ref 4.0–10.5)

## 2015-08-31 MED ORDER — AZITHROMYCIN 250 MG PO TABS: 250.0000 mg | ORAL_TABLET | Freq: Every day | ORAL | Status: DC

## 2015-08-31 MED ORDER — IPRATROPIUM-ALBUTEROL 0.5-2.5 (3) MG/3ML IN SOLN
3.0000 mL | Freq: Once | RESPIRATORY_TRACT | Status: AC
  Administered 2015-08-31: 3 mL via RESPIRATORY_TRACT
  Filled 2015-08-31: qty 3

## 2015-08-31 MED ORDER — ALBUTEROL SULFATE HFA 108 (90 BASE) MCG/ACT IN AERS
2.0000 | INHALATION_SPRAY | RESPIRATORY_TRACT | Status: DC | PRN
Start: 2015-08-31 — End: 2015-08-31
  Administered 2015-08-31: 2 via RESPIRATORY_TRACT
  Filled 2015-08-31: qty 6.7

## 2015-08-31 MED ORDER — HYDROCODONE-HOMATROPINE 5-1.5 MG/5ML PO SYRP
5.0000 mL | ORAL_SOLUTION | Freq: Once | ORAL | Status: AC
  Administered 2015-08-31: 5 mL via ORAL
  Filled 2015-08-31: qty 5

## 2015-08-31 MED ORDER — HYDROCODONE-HOMATROPINE 5-1.5 MG/5ML PO SYRP: 5.0000 mL | ORAL_SOLUTION | Freq: Four times a day (QID) | ORAL | Status: DC | PRN

## 2015-08-31 NOTE — ED Notes (Signed)
Pt reports chest tightness and increased fatigue x 5 days. Pt also reports cough. Pt alert x4. NAD at this time.

## 2015-08-31 NOTE — ED Provider Notes (Signed)
CSN: [id number]     Arrival date & time 08/31/15  [id number] History   First MD Initiated Contact with Patient 08/31/15 [id number]     Chief Complaint  Patient presents with  . Chest Pain     (Consider location/radiation/quality/duration/timing/severity/associated sxs/prior Treatment) HPI Comments: Patient presents to the emergency department with chief complaint of cough and chest tightness. She states that she has had the symptoms for the past 5 days. She states that she has had these symptoms in the past, and has been previously diagnosed with pneumonia. She states this feels same. She wanted to be evaluated for pneumonia. She denies any measured fever, but states that she has had some chills. She denies any other associated symptoms. Denies any nausea, vomiting, or diarrhea. There are no modifying factors.  She states that the symptoms worsened with the season change.  The history is provided by the patient. No language interpreter was used.    Past Medical History  Diagnosis Date  . Hyperlipidemia   . Hypertension   . Depression    Past Surgical History  Procedure Laterality Date  . Abdominal hysterectomy    . Tonsillectomy     Family History  Problem Relation Age of Onset  . Cancer Mother     lung  . Hyperlipidemia Mother   . Hypertension Father   . Hyperlipidemia Father   . Colon cancer Neg Hx   . Colon polyps Neg Hx   . Rectal cancer Neg Hx   . Stomach cancer Neg Hx    Social History  Substance Use Topics  . Smoking status: Current Some Day Smoker  . Smokeless tobacco: Never Used  . Alcohol Use: 1.2 oz/week    2 Cans of beer per week     Comment: 1 daily   OB History    Gravida Para Term Preterm AB TAB SAB Ectopic Multiple Living   2         2     Review of Systems  Constitutional: Negative for fever and chills.  Respiratory: Positive for cough and chest tightness. Negative for shortness of breath.   Cardiovascular: Negative for chest pain.  Gastrointestinal:  Negative for nausea, vomiting, diarrhea and constipation.  Genitourinary: Negative for dysuria.  All other systems reviewed and are negative.     Allergies  Codeine and Sulfa antibiotics  Home Medications   Prior to Admission medications   Medication Sig Start Date End Date Taking? Authorizing Provider  ibuprofen (ADVIL,MOTRIN) 200 MG tablet Take 400 mg by mouth 2 (two) times daily as needed (pain).   Yes Historical Provider, MD  PRESCRIPTION MEDICATION Apply 1 application topically 2 (two) times daily. To arms, legs and back. Steroidal topical ointmen   Yes Historical Provider, MD  lisinopril (PRINIVIL,ZESTRIL) 10 MG tablet Take 10 mg by mouth daily.    Historical Provider, MD  simvastatin (ZOCOR) 40 MG tablet TAKE 1 TABLET (40 MG TOTAL) BY MOUTH DAILY. 12/18/14   Reva Boresanya S Pratt, MD  venlafaxine (EFFEXOR) 37.5 MG tablet TAKE 1 TABLET (37.5 MG TOTAL) BY MOUTH 2 (TWO) TIMES DAILY. 04/02/15   Reva Boresanya S Pratt, MD   BP 142/91 mmHg  Pulse 77  Temp(Src) 97.9 F (36.6 C) (Oral)  Resp 20  SpO2 94% Physical Exam Physical Exam  Constitutional: Pt  is oriented to person, place, and time. Appears well-developed and well-nourished. No distress.  HENT:  Head: Normocephalic and atraumatic.  Right Ear: Tympanic membrane, external ear and ear canal normal.  Left Ear: Tympanic  membrane, external ear and ear canal normal.  Nose: Mucosal edema and moderate rhinorrhea present. No epistaxis. Right sinus exhibits no maxillary sinus tenderness and no frontal sinus tenderness. Left sinus exhibits no maxillary sinus tenderness and no frontal sinus tenderness.  Mouth/Throat: Uvula is midline and mucous membranes are normal. Mucous membranes are not pale and not cyanotic. No oropharyngeal exudate, posterior oropharyngeal edema, posterior oropharyngeal erythema or tonsillar abscesses.  Eyes: Conjunctivae are normal. Pupils are equal, round, and reactive to light.  Neck: Normal range of motion and full passive  range of motion without pain.  Cardiovascular: Normal rate and intact distal pulses.   Pulmonary/Chest: Effort normal and breath sounds normal. No stridor.  Clear and equal breath sounds without focal wheezes, rhonchi, rales  Abdominal: Soft. Bowel sounds are normal. There is no tenderness.  Musculoskeletal: Normal range of motion.  Lymphadenopathy:    Pthas no cervical adenopathy.  Neurological: Pt is alert and oriented to person, place, and time.  Skin: Skin is warm and dry. No rash noted. Pt is not diaphoretic.  Psychiatric: Normal mood and affect.  Nursing note and vitals reviewed.   ED Course  Procedures (including critical care time) Labs Review Labs Reviewed  BASIC METABOLIC PANEL - Abnormal; Notable for the following:    Glucose, Bld 101 (*)    BUN <5 (*)    All other components within normal limits  CBC - Abnormal; Notable for the following:    Hemoglobin 15.5 (*)    HCT 47.5 (*)    All other components within normal limits  I-STAT TROPOININ, ED    Imaging Review Dg Chest 2 View  08/31/2015  CLINICAL DATA:  Cough for 1 week EXAM: CHEST  2 VIEW COMPARISON:  05/09/2013 FINDINGS: Cardiomediastinal silhouette is stable. No acute infiltrate or pleural effusion. No pulmonary edema. Old bilateral rib fractures are noted. IMPRESSION: No active cardiopulmonary disease. Electronically Signed   By: Natasha Mead M.D.   On: 08/31/2015 10:05   I have personally reviewed and evaluated these images and lab results as part of my medical decision-making.   EKG Interpretation   Date/Time:  Monday Aug 31 2015 09:11:04 EDT Ventricular Rate:  73 PR Interval:  144 QRS Duration: 88 QT Interval:  444 QTC Calculation: 489 R Axis:   38 Text Interpretation:  Normal sinus rhythm Nonspecific ST abnormality  Prolonged QT Abnormal ekg Confirmed by Gerhard Munch  MD ([id number]) on  08/31/2015 10:11:43 AM      MDM   Final diagnoses:  Cough    Pt CXR negative for acute infiltrate. Patients  symptoms are consistent with URI, likely viral etiology, but may benefit from a zpak. Pt will be discharged with symptomatic treatment.  Verbalizes understanding and is agreeable with plan. Pt is hemodynamically stable & in NAD prior to dc.     Roxy Horseman, PA-C 08/31/15 1152  Gerhard Munch, MD 08/31/15 [phone]

## 2015-08-31 NOTE — Discharge Instructions (Signed)

## 2015-11-29 ENCOUNTER — Encounter (HOSPITAL_COMMUNITY): Payer: Self-pay

## 2015-11-29 ENCOUNTER — Emergency Department (HOSPITAL_COMMUNITY)
Admission: EM | Admit: 2015-11-29 | Discharge: 2015-11-29 | Disposition: A | Discharge status: Home / Self Care | Payer: Medicaid Other | Attending: Emergency Medicine | Admitting: Emergency Medicine

## 2015-11-29 ENCOUNTER — Emergency Department (HOSPITAL_COMMUNITY): Payer: Medicaid Other

## 2015-11-29 DIAGNOSIS — M545 Low back pain, unspecified: Secondary | ICD-10-CM

## 2015-11-29 DIAGNOSIS — M5136 Other intervertebral disc degeneration, lumbar region: Secondary | ICD-10-CM | POA: Diagnosis not present

## 2015-11-29 DIAGNOSIS — G8929 Other chronic pain: Secondary | ICD-10-CM | POA: Diagnosis not present

## 2015-11-29 DIAGNOSIS — I10 Essential (primary) hypertension: Secondary | ICD-10-CM | POA: Diagnosis not present

## 2015-11-29 DIAGNOSIS — Z79899 Other long term (current) drug therapy: Secondary | ICD-10-CM | POA: Diagnosis not present

## 2015-11-29 DIAGNOSIS — M4316 Spondylolisthesis, lumbar region: Secondary | ICD-10-CM | POA: Insufficient documentation

## 2015-11-29 DIAGNOSIS — F17200 Nicotine dependence, unspecified, uncomplicated: Secondary | ICD-10-CM | POA: Insufficient documentation

## 2015-11-29 DIAGNOSIS — M4310 Spondylolisthesis, site unspecified: Secondary | ICD-10-CM

## 2015-11-29 MED ORDER — IBUPROFEN 400 MG PO TABS
ORAL_TABLET | ORAL | Status: AC
  Filled 2015-11-29: qty 1

## 2015-11-29 MED ORDER — IBUPROFEN 600 MG PO TABS: 600.0000 mg | ORAL_TABLET | Freq: Four times a day (QID) | ORAL | 0 refills | Status: DC | PRN

## 2015-11-29 MED ORDER — METHOCARBAMOL 500 MG PO TABS
1000.0000 mg | ORAL_TABLET | Freq: Once | ORAL | Status: AC
  Administered 2015-11-29: 1000 mg via ORAL
  Filled 2015-11-29: qty 2

## 2015-11-29 MED ORDER — METHOCARBAMOL 500 MG PO TABS: 500.0000 mg | ORAL_TABLET | Freq: Four times a day (QID) | ORAL | 0 refills | Status: DC | PRN

## 2015-11-29 MED ORDER — LIDOCAINE 5 % EX PTCH: 1.0000 | MEDICATED_PATCH | CUTANEOUS | 0 refills | Status: DC

## 2015-11-29 MED ORDER — IBUPROFEN 400 MG PO TABS
400.0000 mg | ORAL_TABLET | Freq: Once | ORAL | Status: AC | PRN
  Administered 2015-11-29: 400 mg via ORAL
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
## 2015-11-29 NOTE — ED Notes (Signed)
Gave pt crackers and coke per [employer].

## 2015-11-29 NOTE — Discharge Instructions (Signed)
Read the information below.  Use the prescribed medication as directed.  Please discuss all new medications with your pharmacist.  You may return to the Emergency Department at any time for worsening condition or any new symptoms that concern you.      If you develop fevers, loss of control of bowel or bladder, abdominal pain, urinary symptoms, weakness or numbness in your legs, or are unable to walk, return to the ER for a recheck.

## 2015-11-29 NOTE — ED Triage Notes (Signed)
Patient arrived by EMS for ongoing chronic back pain, states that she awoke with increased pain and radiation down left leg, denies new trauma, no neuro deficits

## 2016-02-10 ENCOUNTER — Encounter: Payer: Self-pay | Admitting: Licensed Clinical Social Worker

## 2016-04-12 ENCOUNTER — Encounter: Payer: Medicaid Other | Admitting: Internal Medicine

## 2016-06-03 ENCOUNTER — Ambulatory Visit (INDEPENDENT_AMBULATORY_CARE_PROVIDER_SITE_OTHER): Payer: Medicaid Other | Admitting: Internal Medicine

## 2016-06-03 ENCOUNTER — Encounter (INDEPENDENT_AMBULATORY_CARE_PROVIDER_SITE_OTHER): Payer: Self-pay

## 2016-06-03 DIAGNOSIS — Z23 Encounter for immunization: Secondary | ICD-10-CM | POA: Diagnosis not present

## 2016-06-03 DIAGNOSIS — Z9089 Acquired absence of other organs: Secondary | ICD-10-CM

## 2016-06-03 DIAGNOSIS — F411 Generalized anxiety disorder: Secondary | ICD-10-CM | POA: Diagnosis present

## 2016-06-03 DIAGNOSIS — Z90710 Acquired absence of both cervix and uterus: Secondary | ICD-10-CM | POA: Diagnosis not present

## 2016-06-03 DIAGNOSIS — Z8249 Family history of ischemic heart disease and other diseases of the circulatory system: Secondary | ICD-10-CM

## 2016-06-03 DIAGNOSIS — L299 Pruritus, unspecified: Secondary | ICD-10-CM

## 2016-06-03 DIAGNOSIS — Z0000 Encounter for general adult medical examination without abnormal findings: Secondary | ICD-10-CM | POA: Insufficient documentation

## 2016-06-03 DIAGNOSIS — Z801 Family history of malignant neoplasm of trachea, bronchus and lung: Secondary | ICD-10-CM | POA: Diagnosis not present

## 2016-06-03 DIAGNOSIS — Z8349 Family history of other endocrine, nutritional and metabolic diseases: Secondary | ICD-10-CM | POA: Diagnosis not present

## 2016-06-03 DIAGNOSIS — R21 Rash and other nonspecific skin eruption: Secondary | ICD-10-CM | POA: Diagnosis not present

## 2016-06-03 DIAGNOSIS — G8929 Other chronic pain: Secondary | ICD-10-CM | POA: Diagnosis not present

## 2016-06-03 DIAGNOSIS — Z1231 Encounter for screening mammogram for malignant neoplasm of breast: Secondary | ICD-10-CM

## 2016-06-03 DIAGNOSIS — M5416 Radiculopathy, lumbar region: Secondary | ICD-10-CM | POA: Insufficient documentation

## 2016-06-03 DIAGNOSIS — F17210 Nicotine dependence, cigarettes, uncomplicated: Secondary | ICD-10-CM

## 2016-06-03 MED ORDER — NAPROXEN 500 MG PO TABS: 500.0000 mg | ORAL_TABLET | Freq: Two times a day (BID) | ORAL | 1 refills | Status: DC

## 2016-06-03 MED ORDER — SERTRALINE HCL 25 MG PO TABS: 25.0000 mg | ORAL_TABLET | Freq: Every day | ORAL | 5 refills | Status: DC

## 2016-06-03 MED ORDER — HYDROXYZINE HCL 10 MG PO TABS
10.0000 mg | ORAL_TABLET | Freq: Three times a day (TID) | ORAL | 1 refills | Status: DC | PRN
Start: 2016-06-03 — End: 2016-07-28

## 2016-06-03 NOTE — Patient Instructions (Signed)
For your rash: Take hydroxyzine 10 mg 3 times a day as needed. We will attempt to treat your anxiety and helps to also control your itching. We will look at her previous pathology reports as well.  For your anxiety: Take Zoloft 25 mg once a day. After 1 week, you can increase this to 50 mg once a day if you are tolerating the medication. You can also take hydroxyzine 10 mg 3 times a day as needed for anxiety and itching.  For your back pain: You can take naproxen 500 mg twice a day as needed with food.  We will schedule a mammogram.  Please follow up at your convenience for a Pap smear.  Please follow up in 4 weeks for recheck of your anxiety.

## 2016-06-03 NOTE — Assessment & Plan Note (Signed)
Patient reports a history of chronic generalized anxiety for the majority of her life. She previously was on venlafaxine, but has not taken this in several years. Her GAD score today is 18 indicating severe generalized anxiety. She states that this does have a significant effect on her daily life, work, and relationships. She denies cocaine use.  Assessment: Severe generalized anxiety disorder  Plan: -Start Zoloft 25 mg once a day for one week then increase to 50 mg once a day -Hydroxyzine 10 mg 3 times a day when necessary -Follow-up in 4 weeks -Reassess with GAD scoring at follow-up

## 2016-06-03 NOTE — Assessment & Plan Note (Signed)
Patient last had a colonoscopy 10 years ago which was normal. Plan was to repeat in 10 years, in 2024. Patient states her last mammogram was over one year ago. She denies family history of breast cancer and denies any prior abnormal mammograms. We have reordered a mammogram today. Patient reports her last Pap smear was over 3 years ago and denies any prior abnormal Pap smear. Patient will schedule a Pap smear with us.
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
## 2016-06-03 NOTE — Assessment & Plan Note (Addendum)
Patient reports a history of chronic rash since 2013 for which she previously saw dermatology for. 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 However, patient has had ongoing pruritus and rash primarily located on her upper extremities bilaterally, chest, distal lower extremities, lower back. These medications are primarily areas that she can reach. She has been trying an over-the-counter topical hydrocortisone cream without much relief. She has tried all the different changes in detergents, soaps, bedding over the last 4 years. She lives in a group home, but has her own bedroom. She denies any similar symptoms in other people that live in the house. She wonders if this is secondary to anxiety.   On exam, patient has diffuse erythematous papules, patches and excoriations located on upper extremities, distal lower extremities, chest and areas of the back that she can reach with associated excoriations and scabs. Workup so far has revealed normal CBC, complete metabolic panel, TSH and a negative HIV. However, patient did previously have elevated liver enzymes and she has not been tested for hepatitis C.  Plan: -We will treat anxiety with Zoloft and hydroxyzine to determine improvement -Consider rebiopsy on follow-up given three-year time. In between now and previous biopsy -Consider checking for hepatitis C antibody -Check CBC and complete metabolic panel today and TSH

## 2016-06-03 NOTE — Assessment & Plan Note (Signed)
Patient
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
## 2016-06-04 LAB — CMP14 + ANION GAP
ALT: 17 IU/L (ref 0–32)
AST: 20 IU/L (ref 0–40)
Albumin/Globulin Ratio: 1.8 (ref 1.2–2.2)
Albumin: 4.2 g/dL (ref 3.5–5.5)
Alkaline Phosphatase: 66 IU/L (ref 39–117)
Anion Gap: 15 mmol/L (ref 10.0–18.0)
BUN/Creatinine Ratio: 21 (ref 9–23)
BUN: 15 mg/dL (ref 6–24)
Bilirubin Total: 0.8 mg/dL (ref 0.0–1.2)
CO2: 23 mmol/L (ref 18–29)
Calcium: 8.9 mg/dL (ref 8.7–10.2)
Chloride: 101 mmol/L (ref 96–106)
Creatinine, Ser: 0.73 mg/dL (ref 0.57–1.00)
GFR calc Af Amer: 104 mL/min/{1.73_m2} (ref >59)
GFR calc non Af Amer: 90 mL/min/{1.73_m2} (ref >59)
Globulin, Total: 2.4 g/dL (ref 1.5–4.5)
Glucose: 108 mg/dL — ABNORMAL HIGH (ref 65–99)
Potassium: 3.8 mmol/L (ref 3.5–5.2)
Sodium: 139 mmol/L (ref 134–144)
Total Protein: 6.6 g/dL (ref 6.0–8.5)

## 2016-06-04 LAB — CBC WITH DIFFERENTIAL/PLATELET
Basophils Absolute: 0 10*3/uL (ref 0.0–0.2)
Basos: 1 %
EOS (ABSOLUTE): 0.3 10*3/uL (ref 0.0–0.4)
Eos: 4 %
Hematocrit: 41 % (ref 34.0–46.6)
Hemoglobin: 13.6 g/dL (ref 11.1–15.9)
Immature Grans (Abs): 0 10*3/uL (ref 0.0–0.1)
Immature Granulocytes: 0 %
Lymphocytes Absolute: 1.3 10*3/uL (ref 0.7–3.1)
Lymphs: 17 %
MCH: 30.6 pg (ref 26.6–33.0)
MCHC: 33.2 g/dL (ref 31.5–35.7)
MCV: 92 fL (ref 79–97)
Monocytes Absolute: 0.8 10*3/uL (ref 0.1–0.9)
Monocytes: 10 %
Neutrophils Absolute: 5.4 10*3/uL (ref 1.4–7.0)
Neutrophils: 68 %
Platelets: 284 10*3/uL (ref 150–379)
RBC: 4.44 x10E6/uL (ref 3.77–5.28)
RDW: 14 % (ref 12.3–15.4)
WBC: 7.8 10*3/uL (ref 3.4–10.8)

## 2016-06-04 LAB — TSH: TSH: 1.13 u[IU]/mL (ref 0.450–4.500)

## 2016-06-06 ENCOUNTER — Encounter: Payer: Self-pay | Admitting: Internal Medicine

## 2016-06-06 NOTE — Progress Notes (Signed)
Internal Medicine Clinic Attending  Case discussed with Dr. Burns soon after the resident saw the patient.  We reviewed the resident's history and exam and pertinent patient test results.  I agree with the assessment, diagnosis, and plan of care documented in the resident's note. 

## 2016-06-09 ENCOUNTER — Other Ambulatory Visit: Payer: Self-pay | Admitting: Internal Medicine

## 2016-06-09 DIAGNOSIS — Z1231 Encounter for screening mammogram for malignant neoplasm of breast: Secondary | ICD-10-CM

## 2016-06-27 ENCOUNTER — Ambulatory Visit: Payer: Medicaid Other

## 2016-06-27 ENCOUNTER — Ambulatory Visit (INDEPENDENT_AMBULATORY_CARE_PROVIDER_SITE_OTHER): Payer: Medicaid Other | Admitting: Internal Medicine

## 2016-06-27 VITALS — BP 166/94 | HR 67 | Temp 98.1°F | Ht 60.0 in | Wt 112.6 lb

## 2016-06-27 DIAGNOSIS — G8929 Other chronic pain: Secondary | ICD-10-CM | POA: Diagnosis not present

## 2016-06-27 DIAGNOSIS — Z1159 Encounter for screening for other viral diseases: Secondary | ICD-10-CM

## 2016-06-27 DIAGNOSIS — F411 Generalized anxiety disorder: Secondary | ICD-10-CM | POA: Diagnosis not present

## 2016-06-27 DIAGNOSIS — M25551 Pain in right hip: Secondary | ICD-10-CM

## 2016-06-27 DIAGNOSIS — M25561 Pain in right knee: Secondary | ICD-10-CM | POA: Diagnosis not present

## 2016-06-27 DIAGNOSIS — R21 Rash and other nonspecific skin eruption: Secondary | ICD-10-CM | POA: Diagnosis not present

## 2016-06-27 DIAGNOSIS — M5416 Radiculopathy, lumbar region: Secondary | ICD-10-CM | POA: Diagnosis not present

## 2016-06-27 MED ORDER — FLUOCINONIDE 0.05 % EX CREA: 1.0000 "application " | TOPICAL_CREAM | Freq: Two times a day (BID) | CUTANEOUS | 0 refills | Status: DC

## 2016-06-27 MED ORDER — CYCLOBENZAPRINE HCL 5 MG PO TABS: 5.0000 mg | ORAL_TABLET | Freq: Three times a day (TID) | ORAL | 0 refills | Status: DC | PRN

## 2016-06-27 MED ORDER — SERTRALINE HCL 25 MG PO TABS: 50.0000 mg | ORAL_TABLET | Freq: Every day | ORAL | 1 refills | Status: DC

## 2016-06-27 NOTE — Progress Notes (Signed)
CC: hip pain  HPI:  Ms.Jody Rexene EdisonH Jody Mcclure is a 61 y.o. with a PMH of GAD, depression, and chronic lumbar radiculopathy presenting to clinic for follow up on hip pain and GAD.   GAD: Patient was seen last month and started on Zoloft with instructions to taper up to 50mg  a day as well as hydroxyzine 10mg  TID PRN. Patient states that she has noticed a significant difference since this addition. GAD7 score was 18 on previous visit; today it is 5.   Hip pain:  Patient with chronic hip pain which was treated with naproxen 500mg  BID with meals PRN last month. Patient states that she did not have any relief with the naproxen. She endorses the pain to be more in her right buttock with some radiation up to her lower back, as well as pain in her right knee. She denies further injuries or trauma, falls, numbness, tingling, weakness.  Rash:  Patient with >1 year history of rash that has been biopsied previously by dermatology - resulted as spongiotic dermatitis. Work up at that time and others has been negative. During last visit, it was thought that anxiety might be contributing thus addition of hydroxyzine as well for anxiety and itching. CBC, CMET and TSH obtained at last visit are unremarkable. Patient again denies exposure to bedbugs or other infestations - her partner whom she shares a bed with does not have similar complaints. Patients states that the itching and rash have not changed with treatment for anxiety and itching; but on evaluating the rash with me, she and her partner note that it looks as it might be slightly improved.   Please see problem based Assessment and Plan for status of patients chronic conditions.  Past Medical History:  Diagnosis Date  . Depression   . Hyperlipidemia   . Hypertension     Review of Systems:   Review of Systems  Constitutional: Negative for chills and fever.  HENT: Negative for hearing loss.   Eyes: Negative for blurred vision and double vision.    Respiratory: Negative for shortness of breath.   Cardiovascular: Negative for chest pain and leg swelling.  Gastrointestinal: Negative for abdominal pain, constipation, diarrhea, heartburn, nausea and vomiting.  Genitourinary: Negative for flank pain.  Musculoskeletal: Positive for joint pain and myalgias (right hip). Negative for falls.  Skin: Positive for itching and rash.  Neurological: Negative for dizziness, sensory change, focal weakness, weakness and headaches.  Psychiatric/Behavioral: The patient is nervous/anxious (improved).     Physical Exam:  Vitals:   06/27/16 0933  BP: (!) 154/69  Pulse: 68  Temp: 98.1 F (36.7 C)  TempSrc: Oral  SpO2: 99%  Weight: 112 lb 9.6 oz (51.1 kg)  Height: 5' (1.524 m)   Physical Exam  Constitutional: She is oriented to person, place, and time. She appears well-developed and well-nourished. No distress.  HENT:  Head: Normocephalic and atraumatic.  Eyes: EOM are normal. No scleral icterus.  Neck: Neck supple.  Cardiovascular: Normal rate, regular rhythm, normal heart sounds and intact distal pulses.  Exam reveals no gallop and no friction rub.   No murmur heard. Pulmonary/Chest: Effort normal and breath sounds normal. She has no wheezes. She has no rales.  Abdominal: Soft. Bowel sounds are normal. She exhibits no distension. There is no tenderness.  Musculoskeletal: Normal range of motion. She exhibits no edema, tenderness or deformity.  Normal gait No spinal tenderness, mild right sided paraspinal and buttock tenderness, no knee tenderness or edema. ROM, strength and sensation  intact in bil LE.   Neurological: She is alert and oriented to person, place, and time. No sensory deficit. She exhibits normal muscle tone. Coordination normal.  Skin: Skin is warm. Capillary refill takes less than 2 seconds. Rash noted. She is not diaphoretic.  Excoriations present on bil lower portions of upper and lower extremities, lower trunk and low back;  none appear infected. no distinct rash that could be evaluated  Psychiatric: She has a normal mood and affect. Her behavior is normal. Judgment and thought content normal.    Assessment & Plan:   See Encounters Tab for problem based charting.   Patient discussed with Dr. Valla Leaver, MD Internal Medicine PGY1

## 2016-06-27 NOTE — Patient Instructions (Signed)
It was nice meeting you today.  For your anxiety, continue taking zoloft; I have sent in a new prescription so that you will only have to take one pill once a day (50mg ).  For your rash, we are checking you for hepatitis.  Place Lidex creme over the affected areas in a thin film twice a day.   For your back and hip pain, stop naproxen. I have sent in a script for flexeril, a muscle relaxant. Start with one once a day at night as it can make you drowsy. If you tolerate it well, you can take up to 3 times a day. We will also have you see Sports Medicine as they will likely have more options for addressing the problem including exercises or injections.   We will see you in one month.

## 2016-06-28 LAB — HEPATITIS C ANTIBODY: Hep C Virus Ab: <0.1 s/co ratio (ref 0.0–0.9)

## 2016-06-29 NOTE — Assessment & Plan Note (Signed)
Patient with chronic hip pain which was treated with naproxen 500mg  BID with meals PRN last month. Patient states that she did not have any relief with the naproxen. She endorses the pain to be more in her right buttock with some radiation up to her lower back, as well as pain in her right knee. She denies further injuries or trauma, falls, numbness, tingling, weakness.  Exam reveals muscular tenderness in her right buttock. No alarm symptoms or exam findings.  Plan: --flexeril 5mg  TID PRN --referral to sports med

## 2016-06-29 NOTE — Assessment & Plan Note (Signed)
Patient was seen last month and started on Zoloft with instructions to taper up to 50mg  a day as well as hydroxyzine 10mg  TID PRN. Patient states that she has noticed a significant difference since this addition. GAD7 score was 18 on previous visit; today it is 5.   Plan: --continue zoloft 50mg  daily --continue hydroxyzine 10mg  TID PRN

## 2016-06-29 NOTE — Assessment & Plan Note (Signed)
Patient with >1 year history of rash that has been biopsied previously by dermatology - resulted as spongiotic dermatitis. Work up at that time and others has been negative. During last visit, it was thought that anxiety might be contributing thus addition of hydroxyzine as well for anxiety and itching. CBC, CMET and TSH obtained at last visit are unremarkable. Patient again denies exposure to bedbugs or other infestations - her partner whom she shares a bed with does not have similar complaints. Patients states that the itching and rash have not changed with treatment for anxiety and itching; but on evaluating the rash with me, she and her partner note that it looks as it might be slightly improved. She notes she has improvement when using baby oil and lotion but as soon as it is absorbed she begins itching again.  On review of derm notes, they had started her on high potency Lidex 0.05% steroid creme.  Exam reveals excoriations in reachable places without rash on other parts of her skin. No signs of infection or particular rash pattern. Prior workup has not revealed a metabolic or infectious source.  Plan: --advised patient to try and refrain from scratching to prevent infection --advised to continue using baby oil and lotion as this has helped, even if multiple applications are necessary throughout the day. --check Hep C ab - negative --Lidex 0.05% cream BID to affected areas --continue hydroxyzine --if no improvements with these interventions, would consider referral to derm

## 2016-07-01 NOTE — Progress Notes (Signed)
Internal Medicine Clinic Attending  Case discussed with Dr. Svalina  at the time of the visit.  We reviewed the resident's history and exam and pertinent patient test results.  I agree with the assessment, diagnosis, and plan of care documented in the resident's note.  

## 2016-07-05 ENCOUNTER — Telehealth: Payer: Self-pay | Admitting: *Deleted

## 2016-07-05 MED ORDER — BETAMETHASONE VALERATE 0.1 % EX OINT: 1.0000 "application " | TOPICAL_OINTMENT | Freq: Two times a day (BID) | CUTANEOUS | 0 refills | Status: DC

## 2016-07-05 NOTE — Telephone Encounter (Signed)
Attempts to do PA on Fluocinonide Cream 0.05% .  Medication is non preferred.  Preferred list included Betamethasone Valerate Cream, Fluocinonide -solution or Triamcinolone Cream.  Message to be sent to Dr. Samuella CotaSvalina to consider changing to a preferred medication.  Angelina OkGladys Shaye Lagace, RN 07/05/2016 10:13 AM.

## 2016-07-08 NOTE — Addendum Note (Signed)
Addended by: Dorie Rank E on: 07/08/2016 11:46 AM   Modules accepted: Orders

## 2016-07-13 ENCOUNTER — Ambulatory Visit: Payer: Medicaid Other

## 2016-07-28 ENCOUNTER — Encounter: Payer: Self-pay | Admitting: Internal Medicine

## 2016-07-28 ENCOUNTER — Encounter (INDEPENDENT_AMBULATORY_CARE_PROVIDER_SITE_OTHER): Payer: Self-pay

## 2016-07-28 ENCOUNTER — Ambulatory Visit (INDEPENDENT_AMBULATORY_CARE_PROVIDER_SITE_OTHER): Payer: Medicaid Other | Admitting: Internal Medicine

## 2016-07-28 VITALS — BP 136/89 | HR 78 | Temp 98.0°F | Resp 20 | Ht 60.0 in | Wt 108.9 lb

## 2016-07-28 DIAGNOSIS — S93402A Sprain of unspecified ligament of left ankle, initial encounter: Secondary | ICD-10-CM

## 2016-07-28 DIAGNOSIS — Y92007 Garden or yard of unspecified non-institutional (private) residence as the place of occurrence of the external cause: Secondary | ICD-10-CM

## 2016-07-28 DIAGNOSIS — R21 Rash and other nonspecific skin eruption: Secondary | ICD-10-CM

## 2016-07-28 DIAGNOSIS — Z79899 Other long term (current) drug therapy: Secondary | ICD-10-CM

## 2016-07-28 DIAGNOSIS — F411 Generalized anxiety disorder: Secondary | ICD-10-CM | POA: Diagnosis present

## 2016-07-28 DIAGNOSIS — M5416 Radiculopathy, lumbar region: Secondary | ICD-10-CM | POA: Diagnosis not present

## 2016-07-28 DIAGNOSIS — X509XXA Other and unspecified overexertion or strenuous movements or postures, initial encounter: Secondary | ICD-10-CM

## 2016-07-28 DIAGNOSIS — G8929 Other chronic pain: Secondary | ICD-10-CM

## 2016-07-28 DIAGNOSIS — Y9301 Activity, walking, marching and hiking: Secondary | ICD-10-CM

## 2016-07-28 MED ORDER — SERTRALINE HCL 50 MG PO TABS: 50.0000 mg | ORAL_TABLET | Freq: Every day | ORAL | 2 refills | Status: DC

## 2016-07-28 MED ORDER — BETAMETHASONE VALERATE 0.1 % EX OINT: 1.0000 "application " | TOPICAL_OINTMENT | Freq: Two times a day (BID) | CUTANEOUS | 5 refills | Status: DC

## 2016-07-28 MED ORDER — HYDROXYZINE HCL 10 MG PO TABS: 10.0000 mg | ORAL_TABLET | Freq: Three times a day (TID) | ORAL | 2 refills | Status: DC | PRN

## 2016-07-28 MED ORDER — CYCLOBENZAPRINE HCL 5 MG PO TABS: 5.0000 mg | ORAL_TABLET | Freq: Three times a day (TID) | ORAL | 2 refills | Status: DC | PRN

## 2016-07-28 NOTE — Patient Instructions (Addendum)
Ms. Baldi,  It was a pleasure meeting you today. Please continue to ice your foot and keep it elevated. If it starts to develop any sharp pains when walking please follow up in the acute care clinic. I have sent your refills to the pharmacy. Please follow up in 6 months or in the acute care clinic for any urgent medical needs.

## 2016-07-29 NOTE — Progress Notes (Signed)
   CC: Follow up anxiety  HPI:  [name] is a 61 y.o. woman with history of anxiety and chronic lumbar radiculopathy that presents today to the Internal medicine Clinic for follow up on anxiety.  Patient currently takes Zoloft 50 mg daily. She uses hydroxyzine 10 mg as needed for her anxiety,taking two tablets a day.  She states that she feels her anxiety is under control on these two medications. She denies having racing thoughts or feeling rushed like she felt prior to being on the mediations.  She also reports improvement of her chronic rash with the use of hydroxyzine and valisone cream.  She states that she does not experience as much itching as she did before the medications.  She also reports improvement in her hip pain with the use of flexeril.  Patient also reports twisting her left ankle yesterday in a divet when walking across her yard.  She reports swelling and pain but with both symptoms improving.  She states she is able to ambulate on her left foot without experiencing sharp pain.    Past Medical History:  Diagnosis Date  . Depression   . Hyperlipidemia   . Hypertension     Review of Systems:  Review of Systems  Constitutional: Negative for chills and fever.  Eyes: Negative for blurred vision.  Respiratory: Negative for shortness of breath.   Cardiovascular: Negative for chest pain.  Gastrointestinal: Negative for abdominal pain.  Musculoskeletal: Negative for falls.  Neurological: Negative for dizziness and weakness.  All other systems reviewed and are negative.    Physical Exam:  Vitals:   07/28/16 1420 07/28/16 1424  BP: (!) 149/83 136/89  Pulse: 78   Resp: 20   Temp: 98 F (36.7 C)   TempSrc: Oral   SpO2: 95%   Weight: 108 lb 14.4 oz (49.4 kg)   Height: 5' (1.524 m)    Physical Exam  Constitutional: She is well-developed, well-nourished, and in no distress.  Cardiovascular: Normal rate, regular rhythm and normal heart sounds.  Exam reveals no gallop  and no friction rub.   No murmur heard. Pulmonary/Chest: Breath sounds normal. No respiratory distress. She has no wheezes. She has no rales.  Abdominal: Soft. She exhibits no distension. There is no tenderness.  Musculoskeletal:  Left ankle with swelling and bruising  No point tenderness to palpation along the dorsal and lateral aspect of the left foot  Skin:  Excoriations on arms and legs    Assessment & Plan:   See encounters tab for problem based medical decision making.   Patient discussed with Dr. Cyndie Chime

## 2016-07-30 DIAGNOSIS — S93402A Sprain of unspecified ligament of left ankle, initial encounter: Secondary | ICD-10-CM | POA: Insufficient documentation

## 2016-07-30 NOTE — Assessment & Plan Note (Signed)
Assessment:  Left Ankle sprain Patient reports twisting her ankle yesterday while walking.  She has swelling and bruising on exam.  Patient has taken ibuprofen for the pain with benefit.  She has no point tenderness on foot exam to the dorsal and lateral aspects.  I do not suspect a fracture as patient is able to bear weight without increase in pain.  Told patient to ice the area and keep it elevated.  If she was to have persistent or increase in pain she was encouraged to return for reassessment and possible left foot x-ray  Plan -rest, ice and elevation - return if persistent or increase in pain

## 2016-07-30 NOTE — Assessment & Plan Note (Signed)
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 Patient reports improvement with the use of valisone cream and hydroxyzine.  Excoriations were noted on exam on arms and legs that appear from scratching.  While in the room patient would start scratching her arms when discussing the symptoms.  Plan -refill valisone cream

## 2016-07-30 NOTE — Assessment & Plan Note (Signed)
Assessment:  Lumbar radiculopathy Patient had right buttocks pain with radiation to her lower back on previous clinic visit on 06/27/16.  She was started on flexeril  TID PRN and reports significant improvement in her symptoms.  No pain was noted today in the lumbar spine or right extremity.  Plan - refill flexeril  TID PRN

## 2016-07-30 NOTE — Assessment & Plan Note (Signed)
Assessment:  Generalized Anxiety Disorder Patient was started on zoloft on 06/04/15 and titrated up to  daily.  She also takes hydroxyzine as needed.  Patient reports that the current dose controls her anxiety and she is able to perform her daily activities.  Plan -refill zoloft and hydroxyzine

## 2016-08-01 NOTE — Progress Notes (Signed)
Medicine attending: Medical history, presenting problems, physical findings, and medications, reviewed with resident physician Dr Jessica Hoffman on the day of the patient visit and I concur with her evaluation and management plan. 

## 2016-08-02 ENCOUNTER — Ambulatory Visit: Payer: Medicaid Other

## 2016-08-12 ENCOUNTER — Ambulatory Visit: Payer: Medicaid Other

## 2016-09-05 ENCOUNTER — Emergency Department (HOSPITAL_COMMUNITY): Payer: Medicaid Other

## 2016-09-05 ENCOUNTER — Encounter (HOSPITAL_COMMUNITY): Payer: Self-pay | Admitting: *Deleted

## 2016-09-05 ENCOUNTER — Emergency Department (HOSPITAL_COMMUNITY)
Admission: EM | Admit: 2016-09-05 | Discharge: 2016-09-05 | Disposition: A | Discharge status: Home / Self Care | Payer: Medicaid Other | Attending: Emergency Medicine | Admitting: Emergency Medicine

## 2016-09-05 DIAGNOSIS — I10 Essential (primary) hypertension: Secondary | ICD-10-CM | POA: Diagnosis not present

## 2016-09-05 DIAGNOSIS — S46912A Strain of unspecified muscle, fascia and tendon at shoulder and upper arm level, left arm, initial encounter: Secondary | ICD-10-CM | POA: Insufficient documentation

## 2016-09-05 DIAGNOSIS — Z79899 Other long term (current) drug therapy: Secondary | ICD-10-CM | POA: Diagnosis not present

## 2016-09-05 DIAGNOSIS — Y929 Unspecified place or not applicable: Secondary | ICD-10-CM | POA: Diagnosis not present

## 2016-09-05 DIAGNOSIS — Z87891 Personal history of nicotine dependence: Secondary | ICD-10-CM | POA: Insufficient documentation

## 2016-09-05 DIAGNOSIS — Y939 Activity, unspecified: Secondary | ICD-10-CM | POA: Diagnosis not present

## 2016-09-05 DIAGNOSIS — M25512 Pain in left shoulder: Secondary | ICD-10-CM

## 2016-09-05 DIAGNOSIS — Y999 Unspecified external cause status: Secondary | ICD-10-CM | POA: Diagnosis not present

## 2016-09-05 DIAGNOSIS — T148XXA Other injury of unspecified body region, initial encounter: Secondary | ICD-10-CM

## 2016-09-05 DIAGNOSIS — X58XXXA Exposure to other specified factors, initial encounter: Secondary | ICD-10-CM | POA: Insufficient documentation

## 2016-09-05 DIAGNOSIS — S4992XA Unspecified injury of left shoulder and upper arm, initial encounter: Secondary | ICD-10-CM | POA: Diagnosis present

## 2016-09-05 MED ORDER — IBUPROFEN 600 MG PO TABS: 600.0000 mg | ORAL_TABLET | Freq: Four times a day (QID) | ORAL | 0 refills | Status: DC | PRN

## 2016-09-05 MED ORDER — METHOCARBAMOL 500 MG PO TABS: 500.0000 mg | ORAL_TABLET | Freq: Two times a day (BID) | ORAL | 0 refills | Status: DC

## 2016-09-05 MED ORDER — IBUPROFEN 400 MG PO TABS
600.0000 mg | ORAL_TABLET | Freq: Once | ORAL | Status: AC
  Administered 2016-09-05: 600 mg via ORAL
  Filled 2016-09-05: qty 1

## 2016-09-05 NOTE — ED Triage Notes (Signed)
Pt c/o L shoulder pain & L neck pain onset yesterday worsening today & lasting longer, pt denies trauma to the area, denies SOB & CP, pt A&O x4
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
## 2016-09-05 NOTE — ED Notes (Signed)
Patient able to ambulate independently  

## 2016-09-05 NOTE — Discharge Instructions (Signed)
Take your medication as prescribed as needed for pain relief. I recommended applying ice to affected area for 15-20 minutes 3-4 times daily. Follow-up with the orthopedic clinic as of the left ear symptoms are not improved within the next week. Return to the emergency department if symptoms worsen or new onset of fever, redness, swelling, numbness, weakness, decreased range of motion, neck stiffness.

## 2016-09-29 ENCOUNTER — Ambulatory Visit (INDEPENDENT_AMBULATORY_CARE_PROVIDER_SITE_OTHER): Payer: Self-pay | Admitting: Orthopedic Surgery

## 2016-10-05 ENCOUNTER — Ambulatory Visit (INDEPENDENT_AMBULATORY_CARE_PROVIDER_SITE_OTHER): Payer: Self-pay | Admitting: Orthopedic Surgery

## 2016-10-18 ENCOUNTER — Ambulatory Visit (INDEPENDENT_AMBULATORY_CARE_PROVIDER_SITE_OTHER): Payer: Self-pay | Admitting: Orthopedic Surgery

## 2016-12-08 ENCOUNTER — Other Ambulatory Visit: Payer: Self-pay | Admitting: Internal Medicine

## 2017-01-26 ENCOUNTER — Encounter: Payer: Self-pay | Admitting: Internal Medicine

## 2017-01-26 ENCOUNTER — Encounter: Payer: Medicaid Other | Admitting: Internal Medicine

## 2017-01-26 NOTE — Progress Notes (Deleted)
   CC: ***  HPI:  Jody Mcclure is a 61 y.o.   Past Medical History:  Diagnosis Date  . Depression   . Hyperlipidemia   . Hypertension     Review of Systems:  ***  Physical Exam:  There were no vitals filed for this visit. ***  Assessment & Plan:   See encounters tab for problem based medical decision making.  Assessment:  Lumbar radiculopathy Patient had right buttocks pain with radiation to her lower back on previous clinic visit on 06/27/16.  She was started on flexeril 5mg  TID PRN and reports significant improvement in her symptoms.  No pain was noted today in the lumbar spine or right extremity.  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 Patient reports improvement with the use of valisone cream and hydroxyzine.  Excoriations were noted on exam on arms and legs that appear from scratching.  While in the room patient would start scratching her arms when discussing the symptoms.  Plan -refill valisone cream  Assessment:  Generalized Anxiety Disorder Patient was started on zoloft on 06/04/15 and titrated up to 50mg  daily.  She also takes hydroxyzine as needed.  Patient reports that the current dose controls her anxiety and she is able to perform her daily activities. GAD-7 was ***  Plan -refill zoloft and hydroxyzine   Assessment: Health care maintenance Mammogram,  influenza vaccine  Patient {GC/GE:[id number]::"discussed with","seen with"} Dr. {NAMES:[id number]::"Butcher","Granfortuna","E. Angellee Cohill","Klima","Mullen","Narendra","Vincent"}

## 2017-03-20 ENCOUNTER — Other Ambulatory Visit: Payer: Self-pay | Admitting: Internal Medicine

## 2017-03-23 ENCOUNTER — Other Ambulatory Visit: Payer: Self-pay | Admitting: Internal Medicine

## 2017-03-24 NOTE — Telephone Encounter (Signed)
Per refill records patient has been out since august.  Could you please call patient and see how long patient has been out?

## 2017-03-30 NOTE — Telephone Encounter (Signed)
Sorry, I ask her before I sent it, she states she had refills at the pharmacy on file, pharmacy was notified and states she has consistently filled every 30 days for the last 6 months

## 2017-04-19 ENCOUNTER — Other Ambulatory Visit: Payer: Self-pay | Admitting: Internal Medicine

## 2017-04-19 NOTE — Telephone Encounter (Signed)
Next appt scheduled  05/18/17 with PCP. 

## 2017-04-20 ENCOUNTER — Encounter: Payer: Medicaid Other | Admitting: Internal Medicine

## 2017-05-17 NOTE — Progress Notes (Deleted)
   CC: ***  HPI:  [name] is a 62 y.o.   Past Medical History:  Diagnosis Date  . Depression   . Hyperlipidemia   . Hypertension     Review of Systems:  ***  Physical Exam:  There were no vitals filed for this visit. ***  Assessment & Plan:   See encounters tab for problem based medical decision making.  Assessment:  Lumbar radiculopathy Patient has a history of lumbar radiculopathy with significant improvement in her symptoms with flexeril.  ***  Plan - refill flexeril 5mg  TID PRN  Assessment:  Rash and nonspecific skin eruption Patient has a one-year history of rash that has been biopsied by dermatology that shows spongiotic dermatitis. Patient reports improvement with the use of valisone cream and hydroxyzine.  Plan -refill valisone cream  Assessment:  Generalized Anxiety Disorder Patient was started on zoloft on 06/04/15 and titrated up to 50mg  daily.  She also takes hydroxyzine as needed.  Patient reports that the current dose controls her anxiety and she is able to perform her daily activities.  Plan -refill zoloft and hydroxyzine   Assessment:  Healthcare maintenance Patient is overdue for mammogram and pap smear.  Referrals were offered. ***  Patient {GC/GE:[id number]::"discussed with","seen with"} Dr. {NAMES:[id number]::"Butcher","Granfortuna","E. Ermalee Mealy","Klima","Mullen","Narendra","Vincent"}

## 2017-05-18 ENCOUNTER — Encounter: Payer: Medicaid Other | Admitting: Internal Medicine

## 2017-05-22 ENCOUNTER — Encounter (INDEPENDENT_AMBULATORY_CARE_PROVIDER_SITE_OTHER): Payer: Self-pay

## 2017-05-22 ENCOUNTER — Ambulatory Visit: Payer: Self-pay

## 2017-06-26 ENCOUNTER — Other Ambulatory Visit: Payer: Self-pay | Admitting: Oncology

## 2017-07-06 ENCOUNTER — Emergency Department (HOSPITAL_COMMUNITY)
Admission: EM | Admit: 2017-07-06 | Discharge: 2017-07-06 | Disposition: A | Discharge status: Home / Self Care | Payer: Medicaid Other | Attending: Emergency Medicine | Admitting: Emergency Medicine

## 2017-07-06 ENCOUNTER — Other Ambulatory Visit: Payer: Self-pay

## 2017-07-06 ENCOUNTER — Emergency Department (HOSPITAL_COMMUNITY): Payer: Medicaid Other

## 2017-07-06 ENCOUNTER — Other Ambulatory Visit: Payer: Self-pay | Admitting: Oncology

## 2017-07-06 DIAGNOSIS — Z87891 Personal history of nicotine dependence: Secondary | ICD-10-CM | POA: Insufficient documentation

## 2017-07-06 DIAGNOSIS — T148XXA Other injury of unspecified body region, initial encounter: Secondary | ICD-10-CM

## 2017-07-06 DIAGNOSIS — S51012A Laceration without foreign body of left elbow, initial encounter: Secondary | ICD-10-CM | POA: Diagnosis not present

## 2017-07-06 DIAGNOSIS — I10 Essential (primary) hypertension: Secondary | ICD-10-CM | POA: Diagnosis not present

## 2017-07-06 DIAGNOSIS — Z79899 Other long term (current) drug therapy: Secondary | ICD-10-CM | POA: Diagnosis not present

## 2017-07-06 DIAGNOSIS — Y999 Unspecified external cause status: Secondary | ICD-10-CM | POA: Insufficient documentation

## 2017-07-06 DIAGNOSIS — Y9289 Other specified places as the place of occurrence of the external cause: Secondary | ICD-10-CM | POA: Diagnosis not present

## 2017-07-06 DIAGNOSIS — F10220 Alcohol dependence with intoxication, uncomplicated: Secondary | ICD-10-CM | POA: Insufficient documentation

## 2017-07-06 DIAGNOSIS — W1830XA Fall on same level, unspecified, initial encounter: Secondary | ICD-10-CM | POA: Insufficient documentation

## 2017-07-06 DIAGNOSIS — S098XXA Other specified injuries of head, initial encounter: Secondary | ICD-10-CM | POA: Diagnosis present

## 2017-07-06 DIAGNOSIS — Y908 Blood alcohol level of 240 mg/100 ml or more: Secondary | ICD-10-CM | POA: Diagnosis not present

## 2017-07-06 DIAGNOSIS — S0181XA Laceration without foreign body of other part of head, initial encounter: Secondary | ICD-10-CM | POA: Insufficient documentation

## 2017-07-06 DIAGNOSIS — W19XXXA Unspecified fall, initial encounter: Secondary | ICD-10-CM

## 2017-07-06 DIAGNOSIS — F10920 Alcohol use, unspecified with intoxication, uncomplicated: Secondary | ICD-10-CM

## 2017-07-06 DIAGNOSIS — Y939 Activity, unspecified: Secondary | ICD-10-CM | POA: Insufficient documentation

## 2017-07-06 DIAGNOSIS — Z23 Encounter for immunization: Secondary | ICD-10-CM | POA: Diagnosis not present

## 2017-07-06 LAB — COMPREHENSIVE METABOLIC PANEL
ALT: 21 U/L (ref 14–54)
AST: 23 U/L (ref 15–41)
Albumin: 4.4 g/dL (ref 3.5–5.0)
Alkaline Phosphatase: 61 U/L (ref 38–126)
Anion gap: 14 (ref 5–15)
BUN: 13 mg/dL (ref 6–20)
CO2: 23 mmol/L (ref 22–32)
Calcium: 9 mg/dL (ref 8.9–10.3)
Chloride: 107 mmol/L (ref 101–111)
Creatinine, Ser: 0.61 mg/dL (ref 0.44–1.00)
GFR calc Af Amer: >60 mL/min (ref >60)
GFR calc non Af Amer: >60 mL/min (ref >60)
Glucose, Bld: 94 mg/dL (ref 65–99)
Potassium: 3.8 mmol/L (ref 3.5–5.1)
Sodium: 144 mmol/L (ref 135–145)
Total Bilirubin: 0.5 mg/dL (ref 0.3–1.2)
Total Protein: 8.2 g/dL — ABNORMAL HIGH (ref 6.5–8.1)

## 2017-07-06 LAB — CBC WITH DIFFERENTIAL/PLATELET
Basophils Absolute: 0.1 10*3/uL (ref 0.0–0.1)
Basophils Relative: 1 %
Eosinophils Absolute: 0.2 10*3/uL (ref 0.0–0.7)
Eosinophils Relative: 4 %
HCT: 45 % (ref 36.0–46.0)
Hemoglobin: 15.1 g/dL — ABNORMAL HIGH (ref 12.0–15.0)
Lymphocytes Relative: 27 %
Lymphs Abs: 1.7 10*3/uL (ref 0.7–4.0)
MCH: 30.3 pg (ref 26.0–34.0)
MCHC: 33.6 g/dL (ref 30.0–36.0)
MCV: 90.2 fL (ref 78.0–100.0)
Monocytes Absolute: 0.6 10*3/uL (ref 0.1–1.0)
Monocytes Relative: 9 %
Neutro Abs: 3.8 10*3/uL (ref 1.7–7.7)
Neutrophils Relative %: 59 %
Platelets: 278 10*3/uL (ref 150–400)
RBC: 4.99 MIL/uL (ref 3.87–5.11)
RDW: 13.9 % (ref 11.5–15.5)
WBC: 6.4 10*3/uL (ref 4.0–10.5)

## 2017-07-06 LAB — TROPONIN I: Troponin I: <0.03 ng/mL (ref <0.03)

## 2017-07-06 LAB — ETHANOL: Alcohol, Ethyl (B): 293 mg/dL — ABNORMAL HIGH (ref <10)

## 2017-07-06 MED ORDER — TETANUS-DIPHTH-ACELL PERTUSSIS 5-2.5-18.5 LF-MCG/0.5 IM SUSP
0.5000 mL | Freq: Once | INTRAMUSCULAR | Status: AC
  Administered 2017-07-06: 0.5 mL via INTRAMUSCULAR
  Filled 2017-07-06: qty 0.5

## 2017-07-06 MED ORDER — LIDOCAINE-EPINEPHRINE (PF) 2 %-1:200000 IJ SOLN
10.0000 mL | Freq: Once | INTRAMUSCULAR | Status: AC
  Administered 2017-07-06: 10 mL
  Filled 2017-07-06: qty 20

## 2017-07-06 MED ORDER — BACITRACIN ZINC 500 UNIT/GM EX OINT
TOPICAL_OINTMENT | Freq: Two times a day (BID) | CUTANEOUS | Status: DC
  Administered 2017-07-06: 1 via TOPICAL
  Filled 2017-07-06: qty 0.9

## 2017-07-06 NOTE — ED Notes (Signed)
Bed: WA19 Expected date:  Expected time:  Means of arrival:  Comments: ems 

## 2017-07-06 NOTE — ED Provider Notes (Signed)
[facility]-EMERGENCY DEPT Provider Note   CSN: [id number] Arrival date & time: 07/06/17  2130     History   Chief Complaint Chief Complaint  Patient presents with  . Fall    HPI Jody Mcclure is a 62 y.o. female.  Pt was found on the ground outside (30 degrees) [place] by a bystander.  It is unclear how she fell.  She did sustain a laceration to her forehead and to her left elbow.  Pt said she's been drinking.  She denies any current pain.     Past Medical History:  Diagnosis Date  . Depression   . Hyperlipidemia   . Hypertension     Patient Active Problem List   Diagnosis Date Noted  . Sprain of left ankle 07/30/2016  . Encounter for screening mammogram for breast cancer 06/03/2016  . Lumbar radiculopathy, chronic 06/03/2016  . Generalized anxiety disorder 06/03/2016  . Healthcare maintenance 06/03/2016  . Memory changes 04/25/2013  . GERD (gastroesophageal reflux disease) 12/13/2012  . Depression 12/13/2012  . Dysphagia 04/18/2012  . Rash and nonspecific skin eruption 08/15/2011    Past Surgical History:  Procedure Laterality Date  . ABDOMINAL HYSTERECTOMY    . TONSILLECTOMY       OB History    Gravida  2   Para      Term      Preterm      AB      Living  2     SAB      TAB      Ectopic      Multiple      Live Births               Home Medications    Prior to Admission medications   Medication Sig Start Date End Date Taking? Authorizing Provider  betamethasone valerate ointment (VALISONE) 0.1 % APPLY 1 APPLICATION TOPICALLY 2 (TWO) TIMES DAILY. 04/20/17  Yes Hoffman, Jessica Ratliff, DO  cyclobenzaprine (FLEXERIL) 5 MG tablet TAKE 1 TABLET (5 MG TOTAL) BY MOUTH 3 (THREE) TIMES DAILY AS NEEDED FOR MUSCLE SPASMS. 03/22/17  Yes Levert Feinstein, MD  hydrOXYzine (ATARAX/VISTARIL) 10 MG tablet TAKE 1 TABLET BY MOUTH THREE TIMES A DAY AS NEEDED 06/27/17  Yes Hoffman, Jessica Ratliff, DO  ibuprofen  (ADVIL,MOTRIN) 600 MG tablet Take 1 tablet (600 mg total) by mouth every 6 (six) hours as needed. 09/05/16  Yes Barrett Henle, PA-C  methocarbamol (ROBAXIN) 500 MG tablet Take 1 tablet (500 mg total) by mouth 2 (two) times daily. 09/05/16  Yes Barrett Henle, PA-C  sertraline (ZOLOFT) 50 MG tablet TAKE 1 TABLET (50 MG TOTAL) BY MOUTH DAILY. 03/30/17  Yes Hoffman, Jessica Ratliff, DO  omeprazole (PRILOSEC OTC) 20 MG tablet Take 1 tablet (20 mg total) by mouth daily. 12/13/12 04/09/13  Reva Bores, MD    Family History Family History  Problem Relation Age of Onset  . Cancer Mother        lung  . Hyperlipidemia Mother   . Hypertension Father   . Hyperlipidemia Father   . Colon cancer Neg Hx   . Colon polyps Neg Hx   . Rectal cancer Neg Hx   . Stomach cancer Neg Hx     Social History Social History   Tobacco Use  . Smoking status: Former Smoker    Packs/day: 0.50    Types: Cigarettes  . Smokeless tobacco: Never Used  . Tobacco comment: 2 per week  Substance Use Topics  . Alcohol use: Yes    Alcohol/week: 1.2 oz    Types: 2 Cans of beer per week    Comment: 1 daily  . Drug use: No     Allergies   Codeine and Sulfa antibiotics   Review of Systems Review of Systems  Skin: Positive for wound.  All other systems reviewed and are negative.    Physical Exam Updated Vital Signs BP (!) 132/95 (BP Location: Right Arm)   Pulse 84   Temp (!) 94.6 F (34.8 C) (Rectal)   Resp 20   SpO2 97%   Physical Exam  Constitutional: She is oriented to person, place, and time. She appears well-developed and well-nourished.  HENT:  Head: Normocephalic.    Right Ear: External ear normal.  Left Ear: External ear normal.  Nose: Nose normal.  Mouth/Throat: Oropharynx is clear and moist.  Eyes: Pupils are equal, round, and reactive to light. Conjunctivae and EOM are normal.  Neck: Normal range of motion. Neck supple.  Cardiovascular: Normal rate, regular rhythm,  normal heart sounds and intact distal pulses.  Pulmonary/Chest: Effort normal and breath sounds normal.  Abdominal: Soft. Bowel sounds are normal.  Musculoskeletal: Normal range of motion.  Neurological: She is alert and oriented to person, place, and time.  Skin: Capillary refill takes less than 2 seconds.     Lac to left elbow more of a skin tear.  Nothing there to suture.  Psychiatric: She has a normal mood and affect. Her behavior is normal. Judgment and thought content normal.  Nursing note and vitals reviewed.    ED Treatments / Results  Labs (all labs ordered are listed, but only abnormal results are displayed) Labs Reviewed  CBC WITH DIFFERENTIAL/PLATELET - Abnormal; Notable for the following components:      Result Value   Hemoglobin 15.1 (*)    All other components within normal limits  COMPREHENSIVE METABOLIC PANEL - Abnormal; Notable for the following components:   Total Protein 8.2 (*)    All other components within normal limits  ETHANOL - Abnormal; Notable for the following components:   Alcohol, Ethyl (B) 293 (*)    All other components within normal limits  TROPONIN I  URINALYSIS, ROUTINE W REFLEX MICROSCOPIC  RAPID URINE DRUG SCREEN, HOSP PERFORMED    EKG EKG Interpretation  Date/Time:  Thursday July 06 2017 08:31:19 EDT Ventricular Rate:  28 PR Interval:    QRS Duration: 111 QT Interval:  499 QTC Calculation: 340 R Axis:   -163 Text Interpretation:  Incomplete analysis due to missing data in precordial lead(s) Uncertain rhythm: review Prolonged PR interval Anterolateral infarct, recent Missing lead(s): V5 Poor data quality Confirmed by Jacalyn Lefevre [phone]) on 07/06/2017 9:10:15 AM   Radiology Ct Head Wo Contrast  Result Date: 07/06/2017 CLINICAL DATA:  Pt was found laying on the ground. Pt has a laceration on the left side of her forehead with controlled breathing. Pt states she doesn't know how she got the laceration but she has been drinking  alcohol. EXAM: CT HEAD WITHOUT CONTRAST CT CERVICAL SPINE WITHOUT CONTRAST TECHNIQUE: Multidetector CT imaging of the head and cervical spine was performed following the standard protocol without intravenous contrast. Multiplanar CT image reconstructions of the cervical spine were also generated. COMPARISON:  10/07/2014 FINDINGS: CT HEAD FINDINGS Brain: No evidence of acute infarction, hemorrhage, extra-axial collection, ventriculomegaly, or mass effect. Generalized cerebral atrophy. Periventricular white matter low attenuation likely secondary to microangiopathy. Vascular: Cerebrovascular atherosclerotic calcifications are noted. Skull:  Negative for fracture or focal lesion. Sinuses/Orbits: Visualized portions of the orbits are unremarkable. Visualized portions of the paranasal sinuses and mastoid air cells are unremarkable. Other: None. CT CERVICAL SPINE FINDINGS Alignment: 4 mm anterolisthesis of C7 on T1 secondary to severe bilateral facet arthropathy. Skull base and vertebrae: No acute fracture. No primary bone lesion or focal pathologic process. Soft tissues and spinal canal: No prevertebral fluid or swelling. No visible canal hematoma. Disc levels: Degenerative disc disease with disc height loss at C5-6 and C6-7 and to lesser extent C7-T1. Small broad central disc protrusion at C4-5. Broad-based disc osteophyte complex at C5-6 with bilateral uncovertebral degenerative changes and facet arthropathy resulting in moderate right foraminal stenosis. Mild broad-based disc osteophyte complex at C6-7 with bilateral uncovertebral degenerative changes and moderate bilateral facet arthropathy with bilateral foraminal stenosis. Severe bilateral facet arthropathy at C7-T1 and T1-2. Upper chest: Lung apices are clear. Other: No fluid collection or hematoma. IMPRESSION: 1. No acute intracranial pathology. 2.  No acute osseous injury of the cervical spine. 3. Cervical spine spondylosis as described above. Electronically  Signed   By: Elige Ko   On: 07/06/2017 09:07   Ct Cervical Spine Wo Contrast  Result Date: 07/06/2017 CLINICAL DATA:  Pt was found laying on the ground. Pt has a laceration on the left side of her forehead with controlled breathing. Pt states she doesn't know how she got the laceration but she has been drinking alcohol. EXAM: CT HEAD WITHOUT CONTRAST CT CERVICAL SPINE WITHOUT CONTRAST TECHNIQUE: Multidetector CT imaging of the head and cervical spine was performed following the standard protocol without intravenous contrast. Multiplanar CT image reconstructions of the cervical spine were also generated. COMPARISON:  10/07/2014 FINDINGS: CT HEAD FINDINGS Brain: No evidence of acute infarction, hemorrhage, extra-axial collection, ventriculomegaly, or mass effect. Generalized cerebral atrophy. Periventricular white matter low attenuation likely secondary to microangiopathy. Vascular: Cerebrovascular atherosclerotic calcifications are noted. Skull: Negative for fracture or focal lesion. Sinuses/Orbits: Visualized portions of the orbits are unremarkable. Visualized portions of the paranasal sinuses and mastoid air cells are unremarkable. Other: None. CT CERVICAL SPINE FINDINGS Alignment: 4 mm anterolisthesis of C7 on T1 secondary to severe bilateral facet arthropathy. Skull base and vertebrae: No acute fracture. No primary bone lesion or focal pathologic process. Soft tissues and spinal canal: No prevertebral fluid or swelling. No visible canal hematoma. Disc levels: Degenerative disc disease with disc height loss at C5-6 and C6-7 and to lesser extent C7-T1. Small broad central disc protrusion at C4-5. Broad-based disc osteophyte complex at C5-6 with bilateral uncovertebral degenerative changes and facet arthropathy resulting in moderate right foraminal stenosis. Mild broad-based disc osteophyte complex at C6-7 with bilateral uncovertebral degenerative changes and moderate bilateral facet arthropathy with  bilateral foraminal stenosis. Severe bilateral facet arthropathy at C7-T1 and T1-2. Upper chest: Lung apices are clear. Other: No fluid collection or hematoma. IMPRESSION: 1. No acute intracranial pathology. 2.  No acute osseous injury of the cervical spine. 3. Cervical spine spondylosis as described above. Electronically Signed   By: Elige Ko   On: 07/06/2017 09:07   Dg Chest Port 1 View  Result Date: 07/06/2017 CLINICAL DATA:  Found down.  Laceration to forehead. EXAM: PORTABLE CHEST 1 VIEW COMPARISON:  08/31/2015 FINDINGS: Heart and mediastinal contours are within normal limits. No focal opacities or effusions. No acute bony abnormality. Old healed left rib fractures. No pneumothorax. IMPRESSION: No active disease. Electronically Signed   By: Charlett Nose M.D.   On: 07/06/2017 08:46  Procedures .Marland Kitchen.Laceration Repair Date/Time: 07/06/2017 9:07 AM Performed by: Jacalyn LefevreHaviland, Copeland Lapier, MD Authorized by: Jacalyn LefevreHaviland, Ziyon Cedotal, MD   Consent:    Consent obtained:  Verbal   Consent given by:  Patient   Alternatives discussed:  No treatment Anesthesia (see MAR for exact dosages):    Anesthesia method:  Local infiltration   Local anesthetic:  Lidocaine 2% WITH epi Laceration details:    Location:  Face   Face location:  Forehead   Length (cm):  2 Repair type:    Repair type:  Simple Pre-procedure details:    Preparation:  Patient was prepped and draped in usual sterile fashion Exploration:    Hemostasis achieved with:  Epinephrine   Contaminated: no   Treatment:    Area cleansed with:  Betadine   Amount of cleaning:  Standard   Irrigation solution:  Sterile saline   Irrigation method:  Tap Skin repair:    Repair method:  Sutures   Suture size:  6-0   Suture material:  Prolene   Suture technique:  Simple interrupted   Number of sutures:  4 Approximation:    Approximation:  Close Post-procedure details:    Dressing:  Antibiotic ointment and non-adherent dressing   Patient tolerance of  procedure:  Tolerated well, no immediate complications   (including critical care time)  Medications Ordered in ED Medications  Tdap (BOOSTRIX) injection 0.5 mL (0.5 mLs Intramuscular Given 07/06/17 0947)  lidocaine-EPINEPHrine (XYLOCAINE W/EPI) 2 %-1:200000 (PF) injection 10 mL (10 mLs Infiltration Given by Other 07/06/17 [id number])     Initial Impression / Assessment and Plan / ED Course  I have reviewed the triage vital signs and the nursing notes.  Pertinent labs & imaging results that were available during my care of the patient were reviewed by me and considered in my medical decision making (see chart for details).   Pt is now (1415) awake and alert.  She is able to ambulate.  The pt's family is here now and wants to take her home.  As she has reasonable people here to watch her at home, she is stable for d/c.  Return in 1 week for suture removal.  Final Clinical Impressions(s) / ED Diagnoses   Final diagnoses:  Fall, initial encounter  Alcoholic intoxication without complication (HCC)  Facial laceration, initial encounter  Abrasion    ED Discharge Orders    None       Jacalyn LefevreHaviland, Jayant Kriz, MD 07/06/17 1419

## 2017-07-06 NOTE — ED Notes (Addendum)
Pt fnd in bed resting calmly. Pt is arousal by tactile stimulation. Pt denies pain at this time. Pt fiance called and spoke with Tech. Pt has body warmer in place and posey in place for pt safety.

## 2017-07-06 NOTE — ED Notes (Signed)
Ambulated Pt with 2 person minimal assist to the door and back. Pt pretty much completed task on her own.

## 2017-07-06 NOTE — ED Triage Notes (Signed)
Per EMS, pt was found laying on the ground. Pt has a laceration on the left side of her forehead with controlled breathing. Pt states she doesn't know how she got the laceration but she has been drinking alcohol.

## 2017-07-06 NOTE — ED Notes (Signed)
Two unsuccessful attempts to start a peripheral iv with lab draw.

## 2017-07-06 NOTE — ED Notes (Signed)
When returning to pt room, pt was found laying on the floor curled up sleeping.

## 2017-07-12 NOTE — Progress Notes (Signed)
   CC: follow-up on hypertension, recent fall and generalized anxiety disorder  HPI:  [name] is a 62 y.o. female with history noted below that presents to the acute care clinic for  follow-up on hypertension, recent fall and generalized anxiety disorder.  Please see problem based having for the status of patient's chronic medical conditions.  Past Medical History:  Diagnosis Date  . Depression   . Hyperlipidemia   . Hypertension     Review of Systems:  Review of Systems  Constitutional: Negative for fever and malaise/fatigue.  Respiratory: Negative for shortness of breath.   Cardiovascular: Negative for chest pain.  Neurological: Negative for dizziness.    Physical Exam:  Vitals:   07/13/17 1328 07/13/17 1338  BP: (!) 158/83 (!) 161/99  Pulse: 85 87  Temp: 98 F (36.7 C)   TempSrc: Oral   SpO2: 98%   Weight: 124 lb 11.2 oz (56.6 kg)   Height: 5' (1.524 m)    Physical Exam  Constitutional: She is well-developed, well-nourished, and in no distress.  HENT:  Healing wound on forehead with sutures in place  Cardiovascular: Normal rate, regular rhythm and normal heart sounds. Exam reveals no gallop and no friction rub.  No murmur heard. Pulmonary/Chest: Effort normal and breath sounds normal. No respiratory distress. She has no wheezes. She has no rales.  Abdominal: Soft. She exhibits no distension. There is no tenderness. There is no rebound.  Musculoskeletal: She exhibits no edema.  Skin: Skin is warm and dry.     Assessment & Plan:   See encounters tab for problem based medical decision making.   Patient discussed with Dr. Cyndie ChimeGranfortuna

## 2017-07-13 ENCOUNTER — Ambulatory Visit: Payer: Medicaid Other | Admitting: Internal Medicine

## 2017-07-13 ENCOUNTER — Encounter: Payer: Self-pay | Admitting: Internal Medicine

## 2017-07-13 VITALS — BP 161/99 | HR 87 | Temp 98.0°F | Ht 60.0 in | Wt 124.7 lb

## 2017-07-13 DIAGNOSIS — Z0000 Encounter for general adult medical examination without abnormal findings: Secondary | ICD-10-CM

## 2017-07-13 DIAGNOSIS — F411 Generalized anxiety disorder: Secondary | ICD-10-CM

## 2017-07-13 DIAGNOSIS — Z79899 Other long term (current) drug therapy: Secondary | ICD-10-CM

## 2017-07-13 DIAGNOSIS — I10 Essential (primary) hypertension: Secondary | ICD-10-CM | POA: Diagnosis not present

## 2017-07-13 DIAGNOSIS — Z9181 History of falling: Secondary | ICD-10-CM

## 2017-07-13 DIAGNOSIS — S0181XD Laceration without foreign body of other part of head, subsequent encounter: Secondary | ICD-10-CM | POA: Diagnosis not present

## 2017-07-13 DIAGNOSIS — W19XXXD Unspecified fall, subsequent encounter: Secondary | ICD-10-CM

## 2017-07-13 MED ORDER — AMLODIPINE BESYLATE 5 MG PO TABS: 5.0000 mg | ORAL_TABLET | Freq: Every day | ORAL | 1 refills | Status: DC

## 2017-07-13 MED ORDER — SERTRALINE HCL 100 MG PO TABS: 100.0000 mg | ORAL_TABLET | Freq: Every day | ORAL | 2 refills | Status: DC

## 2017-07-13 NOTE — Patient Instructions (Signed)
[name],  It was a pleasure seeing you today.  Please follow up next week on Monday for your suture removal.  Please start taking zoloft one tablet in the morning and one at night until you run out.  When you pick up your next prescription you will only need to take 1 pill a day (100mg  daily).  Please start taking amlodipine 5mg  daily for your blood pressure.  Please follow up with me in 3 months.

## 2017-07-16 DIAGNOSIS — S0181XA Laceration without foreign body of other part of head, initial encounter: Secondary | ICD-10-CM | POA: Insufficient documentation

## 2017-07-16 NOTE — Assessment & Plan Note (Signed)
Assessment:  Generalized Anxiety Disorder Patient was started on zoloft on 06/04/15 and titrated up to 50mg  daily.  She also takes hydroxyzine as needed.  Patient reports that the current dose does not control her anxiety.  GAD-7 today is 15.  Will increase zoloft to 100mg  daily.  Plan - increase zoloft from 50mg  daily to 100mg  daily -continue hydroxyzine

## 2017-07-16 NOTE — Assessment & Plan Note (Signed)
Assessment:  History of fall on 3/28 Patient visited the ED on 3/28 after a fall.Per notes patient was intoxicated and experienced a fall that resulted in a  facial laceration. She had sutures placed in the ED. Sutures do not appear quite ready to be removed.  Will have patient [facility].  Patient states that she usually only has a couple drinks in the month but today she fell she was celebrating and drank more than she usually does.  Discussed importance of safe drinking.  [facility]

## 2017-07-16 NOTE — Assessment & Plan Note (Signed)
Assessment:  Healthcare maintenance Patient is overdue for mammogram and Pap smear. Referral for mammogram was offered. In office Pap smear was offered. Patient would like to have pap smear at next visit.  Patient agreeable for a referral for screening mammogram  Plan -screening mammogram -pap smear next visit

## 2017-07-16 NOTE — Assessment & Plan Note (Signed)
Assessment:  Essential hypertension  Patient has had consistently elevated blood pressure >130/80.  Todays bp was 158/83 and on repeat 161/99.  Start amlodipine 5mg  daily.  Plan -amlodipine 5mg  daily

## 2017-07-17 ENCOUNTER — Encounter: Payer: Self-pay | Admitting: Internal Medicine

## 2017-07-17 ENCOUNTER — Ambulatory Visit: Payer: Medicaid Other

## 2017-07-17 NOTE — Progress Notes (Signed)
Medicine attending: Medical history, presenting problems, physical findings, and medications, reviewed with resident physician Dr Jessica Hoffman on the day of the patient visit and I concur with her evaluation and management plan. 

## 2017-07-19 ENCOUNTER — Ambulatory Visit (INDEPENDENT_AMBULATORY_CARE_PROVIDER_SITE_OTHER): Payer: Medicaid Other | Admitting: Internal Medicine

## 2017-07-19 ENCOUNTER — Other Ambulatory Visit: Payer: Self-pay

## 2017-07-19 DIAGNOSIS — Z4802 Encounter for removal of sutures: Secondary | ICD-10-CM | POA: Diagnosis not present

## 2017-07-19 NOTE — Progress Notes (Signed)
Medicine attending: Patient here for simple suture removal., reviewed with resident physician Dr Toma CopierBethany Molt on the day of the patient visit and I concur with her evaluation and management plan.

## 2017-07-19 NOTE — Assessment & Plan Note (Signed)
Patient presents for suture removal. The wound is well healed without signs of infection. 4 sutures removed without complication. Dressed with Bacitracin and clean bandage. Wound care and activity instructions given. Return prn.

## 2017-07-19 NOTE — Progress Notes (Signed)
   CC: Presents for suture removal  HPI:  Ms.Glorie Rexene EdisonH Edwyna ShellHart is a 62 y.o. F with history of HTN, HLD and depression who presents today for suture removal. She had 4 sutures placed in ED 07/07/17 following fall while intoxicated during a celebration. CT c-spine and head were negative for acute process and patient reports she feels well and has not noted any purulence or pain from her laceration site.  For details regarding today's visit please refer to the assessment and plan.  Past Medical History:  Diagnosis Date  . Depression   . Hyperlipidemia   . Hypertension    Review of Systems:   General: Denies fevers, chills, myalgias, headaches Skin: Denies any issues at laceration site. No drainage or pain. No surrounding erythema.   Physical Exam: General: Alert, in no acute distress. Pleasant and conversant HEENT: Small well healed laceration on left forehead. 4 stitches. No purulence, erythema or foul odor. Site closed.   Vitals:   07/19/17 1413  BP: (!) 159/102  Pulse: 85  Temp: 97.8 F (36.6 C)  TempSrc: Oral  SpO2: 98%  Weight: 125 lb 4.8 oz (56.8 kg)  Height: 5' (1.524 m)   Body mass index is 24.47 kg/m.  Assessment & Plan:   See Encounters Tab for problem based charting.  Patient discussed with Dr. Cyndie ChimeGranfortuna

## 2017-08-22 ENCOUNTER — Ambulatory Visit: Payer: Medicaid Other

## 2017-09-01 ENCOUNTER — Encounter: Payer: Self-pay | Admitting: *Deleted

## 2017-09-12 ENCOUNTER — Ambulatory Visit: Payer: Medicaid Other

## 2017-10-11 ENCOUNTER — Other Ambulatory Visit: Payer: Self-pay | Admitting: Internal Medicine

## 2017-10-11 NOTE — Telephone Encounter (Signed)
Next appt scheduled 8/6 with PCP. 

## 2017-10-19 ENCOUNTER — Encounter: Payer: Medicaid Other | Admitting: Internal Medicine

## 2017-11-07 NOTE — Progress Notes (Signed)
   CC: Follow up on essential hypertension  HPI:  [name] is a 62 y.o. female with history noted below that presents to the Internal medicine clinic for follow up on essential hypertension.  Past Medical History:  Diagnosis Date  . Depression   . Hyperlipidemia   . Hypertension     Review of Systems:  Review of Systems  Constitutional: Negative for chills and fever.  Respiratory: Negative for shortness of breath.   Cardiovascular: Negative for chest pain.  Gastrointestinal: Positive for constipation.  Skin: Positive for rash.  Neurological: Negative for dizziness.  Psychiatric/Behavioral: The patient is nervous/anxious.      Physical Exam:  Vitals:   11/14/17 0909  BP: (!) 149/91  Pulse: 91  Temp: 98.1 F (36.7 C)  TempSrc: Oral  SpO2: 97%  Weight: 133 lb 14.4 oz (60.7 kg)   Physical Exam  Constitutional: She is well-developed, well-nourished, and in no distress.  Cardiovascular: Normal rate, regular rhythm and normal heart sounds. Exam reveals no gallop and no friction rub.  No murmur heard. Pulmonary/Chest: Effort normal and breath sounds normal. No respiratory distress. She has no wheezes. She has no rales.  Musculoskeletal: She exhibits no edema.  Skin:  Erythema noted over right hand knuckles (over metacarpal bones)  No swelling or tenderness noted     Assessment & Plan:   See encounters tab for problem based medical decision making.    Patient discussed with Dr. Oswaldo DoneVincent

## 2017-11-14 ENCOUNTER — Encounter: Payer: Self-pay | Admitting: Internal Medicine

## 2017-11-14 ENCOUNTER — Encounter (INDEPENDENT_AMBULATORY_CARE_PROVIDER_SITE_OTHER): Payer: Self-pay

## 2017-11-14 ENCOUNTER — Ambulatory Visit (INDEPENDENT_AMBULATORY_CARE_PROVIDER_SITE_OTHER): Payer: Medicaid Other | Admitting: Internal Medicine

## 2017-11-14 VITALS — BP 149/91 | HR 91 | Temp 98.1°F | Wt 133.9 lb

## 2017-11-14 DIAGNOSIS — Z79899 Other long term (current) drug therapy: Secondary | ICD-10-CM | POA: Diagnosis not present

## 2017-11-14 DIAGNOSIS — K5900 Constipation, unspecified: Secondary | ICD-10-CM | POA: Diagnosis not present

## 2017-11-14 DIAGNOSIS — F411 Generalized anxiety disorder: Secondary | ICD-10-CM | POA: Diagnosis not present

## 2017-11-14 DIAGNOSIS — F0390 Unspecified dementia without behavioral disturbance: Secondary | ICD-10-CM | POA: Diagnosis not present

## 2017-11-14 DIAGNOSIS — I10 Essential (primary) hypertension: Secondary | ICD-10-CM | POA: Diagnosis present

## 2017-11-14 DIAGNOSIS — R21 Rash and other nonspecific skin eruption: Secondary | ICD-10-CM

## 2017-11-14 DIAGNOSIS — F03A0 Unspecified dementia, mild, without behavioral disturbance, psychotic disturbance, mood disturbance, and anxiety: Secondary | ICD-10-CM

## 2017-11-14 NOTE — Patient Instructions (Addendum)
[name],  It was a pleasure seeing you today.  Please follow up in 1 month.  Your zoloft has been increased to 150mg  daily.  Please continue to take your blood pressure medication.  A referral to dermatology has been made.

## 2017-11-15 ENCOUNTER — Other Ambulatory Visit: Payer: Self-pay | Admitting: Internal Medicine

## 2017-11-15 DIAGNOSIS — K5900 Constipation, unspecified: Secondary | ICD-10-CM | POA: Insufficient documentation

## 2017-11-15 DIAGNOSIS — F03A0 Unspecified dementia, mild, without behavioral disturbance, psychotic disturbance, mood disturbance, and anxiety: Secondary | ICD-10-CM | POA: Insufficient documentation

## 2017-11-15 DIAGNOSIS — F0390 Unspecified dementia without behavioral disturbance: Secondary | ICD-10-CM | POA: Insufficient documentation

## 2017-11-15 MED ORDER — SERTRALINE HCL 50 MG PO TABS: 150.0000 mg | ORAL_TABLET | Freq: Every day | ORAL | 5 refills | Status: DC

## 2017-11-15 MED ORDER — SENNOSIDES-DOCUSATE SODIUM 8.6-50 MG PO TABS: 1.0000 | ORAL_TABLET | Freq: Every day | ORAL | 1 refills | Status: DC

## 2017-11-15 NOTE — Assessment & Plan Note (Signed)
Assessment:  Rash Patient developed erythema over the dorsal right hand.  Reports that the area is itching and flaking.  Also reports white patches in the area that comes and goes.  Has tried betamethasone valerate without benefit.  Will refer to dermatology  Plan -dermatology referral

## 2017-11-15 NOTE — Assessment & Plan Note (Signed)
Assessment:  Essential hypertension  Patient was started on amlodipine 5mg  daily at previous clinic visit.  Patient reports adhearance to the medication except for yesterday.  Today's blood pressure is 149/91 and above goal of 140/80.  Since patient did not take her blood pressure yesterday or today will not make changes today and have patient follow up in one month to re-check blood pressure.   Plan -amlodipine 5mg  daily

## 2017-11-15 NOTE — Assessment & Plan Note (Signed)
Assessment:  Constipation Reports hard stools.  Has not tried anything.  Recommended senokot  Plan -senokot

## 2017-11-15 NOTE — Assessment & Plan Note (Signed)
Assessment:  Mild dementia Patient reports history of dementia.  Completed mini-mental state examination and patient scored 24.  Patient has mild dementia  Plan -MMSE

## 2017-11-15 NOTE — Assessment & Plan Note (Signed)
Assessment:  Generalized anxiety disorder Zoloft was increased on previous clinic visit from 50mg  to 100mg  due to GAD-7 of 15.  Today GAD-7 was 11.  Discussed option of increasing medication to 150mg  daily and patient stated she would like to do so.  Plan - increase zoloft to 150mg  daily

## 2017-11-16 NOTE — Telephone Encounter (Signed)
Next appt scheduled 9/5 with PCP. 

## 2017-11-16 NOTE — Progress Notes (Signed)
Internal Medicine Clinic Attending  Case discussed with Dr. Hoffman at the time of the visit.  We reviewed the resident's history and exam and pertinent patient test results.  I agree with the assessment, diagnosis, and plan of care documented in the resident's note.  

## 2017-11-24 ENCOUNTER — Encounter: Payer: Self-pay | Admitting: *Deleted

## 2017-12-07 ENCOUNTER — Telehealth: Payer: Self-pay | Admitting: Internal Medicine

## 2017-12-14 ENCOUNTER — Encounter: Payer: Medicaid Other | Admitting: Internal Medicine

## 2017-12-21 ENCOUNTER — Encounter: Payer: Medicaid Other | Admitting: Internal Medicine

## 2017-12-28 ENCOUNTER — Encounter (HOSPITAL_COMMUNITY): Payer: Self-pay | Admitting: Emergency Medicine

## 2017-12-28 ENCOUNTER — Emergency Department (HOSPITAL_COMMUNITY): Payer: Medicaid Other

## 2017-12-28 ENCOUNTER — Encounter: Payer: Medicaid Other | Admitting: Internal Medicine

## 2017-12-28 ENCOUNTER — Emergency Department (HOSPITAL_COMMUNITY)
Admission: EM | Admit: 2017-12-28 | Discharge: 2017-12-28 | Disposition: A | Discharge status: Home / Self Care | Payer: Medicaid Other | Attending: Emergency Medicine | Admitting: Emergency Medicine

## 2017-12-28 DIAGNOSIS — R0602 Shortness of breath: Secondary | ICD-10-CM | POA: Diagnosis present

## 2017-12-28 DIAGNOSIS — R062 Wheezing: Secondary | ICD-10-CM

## 2017-12-28 DIAGNOSIS — J181 Lobar pneumonia, unspecified organism: Secondary | ICD-10-CM

## 2017-12-28 DIAGNOSIS — J189 Pneumonia, unspecified organism: Secondary | ICD-10-CM | POA: Insufficient documentation

## 2017-12-28 LAB — BASIC METABOLIC PANEL
Anion gap: 11 (ref 5–15)
BUN: 9 mg/dL (ref 8–23)
CO2: 24 mmol/L (ref 22–32)
Calcium: 8.9 mg/dL (ref 8.9–10.3)
Chloride: 103 mmol/L (ref 98–111)
Creatinine, Ser: 0.53 mg/dL (ref 0.44–1.00)
GFR calc Af Amer: >60 mL/min (ref >60)
GFR calc non Af Amer: >60 mL/min (ref >60)
Glucose, Bld: 115 mg/dL — ABNORMAL HIGH (ref 70–99)
Potassium: 3.5 mmol/L (ref 3.5–5.1)
Sodium: 138 mmol/L (ref 135–145)

## 2017-12-28 LAB — CBC
HCT: 38.5 % (ref 36.0–46.0)
Hemoglobin: 12.9 g/dL (ref 12.0–15.0)
MCH: 29.7 pg (ref 26.0–34.0)
MCHC: 33.5 g/dL (ref 30.0–36.0)
MCV: 88.7 fL (ref 78.0–100.0)
Platelets: 331 10*3/uL (ref 150–400)
RBC: 4.34 MIL/uL (ref 3.87–5.11)
RDW: 14.6 % (ref 11.5–15.5)
WBC: 9.8 10*3/uL (ref 4.0–10.5)

## 2017-12-28 LAB — I-STAT TROPONIN, ED: Troponin i, poc: 0 ng/mL (ref 0.00–0.08)

## 2017-12-28 MED ORDER — IPRATROPIUM-ALBUTEROL 0.5-2.5 (3) MG/3ML IN SOLN
3.0000 mL | Freq: Once | RESPIRATORY_TRACT | Status: AC
  Administered 2017-12-28: 3 mL via RESPIRATORY_TRACT
  Filled 2017-12-28: qty 3

## 2017-12-28 MED ORDER — PREDNISONE 20 MG PO TABS
60.0000 mg | ORAL_TABLET | Freq: Once | ORAL | Status: AC
  Administered 2017-12-28: 60 mg via ORAL
  Filled 2017-12-28: qty 3

## 2017-12-28 MED ORDER — ALBUTEROL SULFATE HFA 108 (90 BASE) MCG/ACT IN AERS
1.0000 | INHALATION_SPRAY | Freq: Once | RESPIRATORY_TRACT | Status: AC
  Administered 2017-12-28: 2 via RESPIRATORY_TRACT
  Filled 2017-12-28: qty 6.7

## 2017-12-28 MED ORDER — PREDNISONE 10 MG PO TABS: 40.0000 mg | ORAL_TABLET | Freq: Every day | ORAL | 0 refills | Status: DC

## 2017-12-28 MED ORDER — DOXYCYCLINE HYCLATE 100 MG PO CAPS: 100.0000 mg | ORAL_CAPSULE | Freq: Two times a day (BID) | ORAL | 0 refills | Status: AC

## 2017-12-28 NOTE — ED Provider Notes (Signed)
[facility]-EMERGENCY DEPT Provider Note   CSN: [id number] Arrival date & time: 12/28/17  1055     History   Chief Complaint Chief Complaint  Patient presents with  . Shortness of Breath  . Cough  . Chest Pain    HPI Jody Mcclure is a 62 y.o. female with a history of hypertension, hyperlipidemia who presents emergency department today for shortness of breath.  Patient reports that 3 days ago she started developing a dry cough.  This was followed by shortness of breath that occurred with minimal exertion such as walking to and from the bathroom.  She denies any shortness of breath at rest.  No orthopnea or paroxysmal nocturnal dyspnea.  Patient reports that since that time she has developed wheezing as well as chest tightness.  She states this feels similar when she has had bronchitis or pneumonia in the past.  She denies any fever, chills, chest pain, nausea/vomiting associated with this.  She has tried ibuprofen last night for symptoms without any relief.  She reports prior tobacco abuse but has quit for several years.  She denies history of COPD or asthma.  No history of CHF.  Patient reports no sick contacts.  She denies any exogenous estrogen use, recent travel, immobilization, surgery, history of cancer, hemoptysis or history of blood clot.  HPI  Past Medical History:  Diagnosis Date  . Depression   . Hyperlipidemia   . Hypertension     Patient Active Problem List   Diagnosis Date Noted  . Mild dementia 11/15/2017  . Constipation 11/15/2017  . Encounter for screening mammogram for breast cancer 06/03/2016  . Lumbar radiculopathy, chronic 06/03/2016  . Generalized anxiety disorder 06/03/2016  . Healthcare maintenance 06/03/2016  . GERD (gastroesophageal reflux disease) 12/13/2012  . Depression 12/13/2012  . Dysphagia 04/18/2012  . Hypertension 08/15/2011  . Rash and nonspecific skin eruption 08/15/2011    Past Surgical History:  Procedure  Laterality Date  . ABDOMINAL HYSTERECTOMY    . TONSILLECTOMY       OB History    Gravida  2   Para      Term      Preterm      AB      Living  2     SAB      TAB      Ectopic      Multiple      Live Births               Home Medications    Prior to Admission medications   Medication Sig Start Date End Date Taking? Authorizing Provider  amLODipine (NORVASC) 5 MG tablet Take 1 tablet (5 mg total) by mouth daily. 07/13/17 07/13/18 Yes Hoffman, Jessica Ratliff, DO  betamethasone valerate ointment (VALISONE) 0.1 % APPLY 1 APPLICATION TOPICALLY 2 (TWO) TIMES DAILY. 11/16/17  Yes Hoffman, Jessica Ratliff, DO  ibuprofen (ADVIL,MOTRIN) 200 MG tablet Take 400 mg by mouth every 6 (six) hours as needed for headache, mild pain or moderate pain.   Yes [provider]  sertraline (ZOLOFT) 50 MG tablet Take 3 tablets (150 mg total) by mouth daily. 11/15/17  Yes Hoffman, Jessica Ratliff, DO  cyclobenzaprine (FLEXERIL) 5 MG tablet TAKE 1 TABLET BY MOUTH THREE TIMES A DAY AS NEEDED FOR MUSCLE SPASMS Patient not taking: Reported on 12/28/2017 10/11/17   Levert Feinstein, MD  hydrOXYzine (ATARAX/VISTARIL) 10 MG tablet TAKE 1 TABLET BY MOUTH THREE TIMES A DAY AS NEEDED Patient not  taking: Reported on 12/28/2017 10/11/17   Levert FeinsteinGranfortuna, James M, MD  senna-docusate (SENOKOT-S) 8.6-50 MG tablet Take 1 tablet by mouth daily. Patient not taking: Reported on 12/28/2017 11/15/17   Geralyn CorwinHoffman, Jessica Ratliff, DO  omeprazole (PRILOSEC OTC) 20 MG tablet Take 1 tablet (20 mg total) by mouth daily. 12/13/12 04/09/13  Reva BoresPratt, Tanya S, MD    Family History Family History  Problem Relation Age of Onset  . Cancer Mother        lung  . Hyperlipidemia Mother   . Hypertension Father   . Hyperlipidemia Father   . Colon cancer Neg Hx   . Colon polyps Neg Hx   . Rectal cancer Neg Hx   . Stomach cancer Neg Hx     Social History Social History   Tobacco Use  . Smoking status: Former Smoker     Packs/day: 0.50    Types: Cigarettes  . Smokeless tobacco: Never Used  . Tobacco comment: 2 per week  Substance Use Topics  . Alcohol use: Yes    Alcohol/week: 2.0 standard drinks    Types: 2 Cans of beer per week    Comment: 1 daily  . Drug use: No     Allergies   Codeine and Sulfa antibiotics   Review of Systems Review of Systems  All other systems reviewed and are negative.    Physical Exam Updated Vital Signs BP 132/60 (BP Location: Left Arm)   Pulse 97   Temp 97.7 F (36.5 C) (Oral)   Resp 18   Ht 5' (1.524 m)   Wt 61.2 kg   SpO2 96%   BMI 26.37 kg/m   Physical Exam  Constitutional: She appears well-developed and well-nourished.  HENT:  Head: Normocephalic and atraumatic.  Right Ear: External ear normal.  Left Ear: External ear normal.  Nose: Nose normal.  Mouth/Throat: Uvula is midline, oropharynx is clear and moist and mucous membranes are normal. No tonsillar exudate.  Eyes: Pupils are equal, round, and reactive to light. Right eye exhibits no discharge. Left eye exhibits no discharge. No scleral icterus.  Neck: Trachea normal. Neck supple. No spinous process tenderness present. No neck rigidity. Normal range of motion present.  Cardiovascular: Normal rate, regular rhythm and intact distal pulses.  No murmur heard. Pulses:      Radial pulses are 2+ on the right side, and 2+ on the left side.       Dorsalis pedis pulses are 2+ on the right side, and 2+ on the left side.       Posterior tibial pulses are 2+ on the right side, and 2+ on the left side.  No lower extremity swelling or edema. Calves symmetric in size bilaterally.  Pulmonary/Chest: Effort normal. She has wheezes (expiratory). She has rales in the right lower field. She exhibits no tenderness.  Patient satting at 96% on room air. No increased work of breathing. No accessory muscle use. Patient is sitting upright, speaking in full sentences without difficulty   Abdominal: Soft. Bowel sounds are  normal. There is no tenderness. There is no rebound and no guarding.  Musculoskeletal: She exhibits no edema.  Lymphadenopathy:    She has no cervical adenopathy.  Neurological: She is alert.  Skin: Skin is warm and dry. No rash noted. She is not diaphoretic.  Psychiatric: She has a normal mood and affect.  Nursing note and vitals reviewed.    ED Treatments / Results  Labs (all labs ordered are listed, but only abnormal results are  displayed) Labs Reviewed  BASIC METABOLIC PANEL - Abnormal; Notable for the following components:      Result Value   Glucose, Bld 115 (*)    All other components within normal limits  CBC  I-STAT TROPONIN, ED    EKG EKG Interpretation  Date/Time:  Thursday December 28 2017 11:03:32 EDT Ventricular Rate:  88 PR Interval:    QRS Duration: 86 QT Interval:  331 QTC Calculation: 401 R Axis:   -46 Text Interpretation:  Sinus rhythm Right atrial enlargement Left axis deviation Nonspecific repol abnormality, diffuse leads Baseline wander in lead(s) I II III aVL aVF V1 V2 V3 V4 V5 V6 No significant change since last tracing Confirmed by Jacalyn Lefevre [phone]) on 12/28/2017 12:36:12 PM   Radiology Dg Chest 2 View  Result Date: 12/28/2017 CLINICAL DATA:  Several days of nonproductive cough and exertional shortness of breath. Chest tightness for the past week associated with short throat. History of hypertension, gastroesophageal reflux, former smoker. EXAM: CHEST - 2 VIEW COMPARISON:  Portable chest x-ray of July 06, 2017 FINDINGS: The lungs are adequately inflated. There is subtle increased density that projects over the lower thoracic spine likely on the right in the lower lobe. The interstitial markings of both lungs are coarse. The heart and pulmonary vascularity are normal. The mediastinum is normal in width. There is calcification in the wall of the aortic arch. The bony thorax exhibits no acute abnormality. IMPRESSION: Probable pneumonia in the  posteromedial aspect of the right lower lobe. Underlying COPD. Followup PA and lateral chest X-ray is recommended in 3-4 weeks following trial of antibiotic therapy to ensure resolution and exclude underlying malignancy. Thoracic aortic atherosclerosis. Electronically Signed   By: David  [place] M.D.   On: 12/28/2017 11:28   Procedures Procedures (including critical care time)  Medications Ordered in ED Medications  albuterol (PROVENTIL HFA;VENTOLIN HFA) 108 (90 Base) MCG/ACT inhaler 1-2 puff (has no administration in time range)  ipratropium-albuterol (DUONEB) 0.5-2.5 (3) MG/3ML nebulizer solution 3 mL (3 mLs Nebulization Given 12/28/17 1227)  predniSONE (DELTASONE) tablet 60 mg (60 mg Oral Given 12/28/17 1227)  ipratropium-albuterol (DUONEB) 0.5-2.5 (3) MG/3ML nebulizer solution 3 mL (3 mLs Nebulization Given 12/28/17 1330)     Initial Impression / Assessment and Plan / ED Course  I have reviewed the triage vital signs and the nursing notes.  Pertinent labs & imaging results that were available during my care of the patient were reviewed by me and considered in my medical decision making (see chart for details).     62 year old female presenting with shortness of breath, cough and chest tightness.  She denies any chest pain, fever, hemoptysis or lower leg swelling.  Patient found to have pneumonia of the right lower lobe on chest x-ray.  Patient also with diffuse wheezing that was relieved with DuoNeb.  Patient was given dose of prednisone in the department and will be discharged home with 5-day course.  Will treat for community acquired pneumonia.  Patient was ambulated in the department without desaturation (O2 sat stayed between 94-96% and patient denied any shortness of breath).  Patient denies any shortness of breath currently.  She states she is at her baseline.  Her vital signs have been stable throughout.  She does not meet Sirs or sepsis criteria.  Patient's curb 65 score places her at  low risk.  EKG is NSR without significant change from prior.  Troponin within normal limits.  Remaining labs are reviewed and reassuring.  I advised the patient  to follow-up with PCP this week.  He does have follow-up x-ray in 3-4 weeks for repeat chest x-ray to make sure this is resolved and to exclude underlying malignancy. Patient verbalized understanding of this. Specific return precautions discussed. Time was given for all questions to be answered. The patient verbalized understanding and agreement with plan. The patient appears safe for discharge home.  Final Clinical Impressions(s) / ED Diagnoses   Final diagnoses:  Community acquired pneumonia of right lower lobe of lung (HCC)  Wheezing    ED Discharge Orders         Ordered    doxycycline (VIBRAMYCIN) 100 MG capsule  2 times daily     12/28/17 1347    predniSONE (DELTASONE) 10 MG tablet  Daily     12/28/17 1347           Princella Pellegrini 12/28/17 1454    Jacalyn Lefevre, MD 12/28/17 [phone]

## 2017-12-28 NOTE — Discharge Instructions (Signed)
[facility] You have been diagnosed today with Pneumonia   Tests performed today include: Blood counts and electrolytes Chest x-ray -- shows pneumonia Vital signs. See below for your results today.   Take medications as prescribed. Please take all of your antibiotics until finished! You may develop abdominal discomfort or diarrhea from the antibiotic.  You may help offset this with probiotics which you can buy or get in yogurt. Do not eat or take the probiotics until 2 hours after your antibiotic. Do not take your medicine if develop an itchy rash, swelling in your mouth or lips, or difficulty breathing. Follow-up with your doctor in the next week in regards to your hospital visit. If you do not have a doctor use the resource guide listed below to help you find one. You may return to the emergency department if symptoms worsen, become progressive, or become more concerning.  Please note your antibiotic doxycycline can cause sensitivity to the light.  Please wear sunscreen when outside in the sun to prevent sunburn.  Albuterol inhaler - this medication will help open up your airway. Use inhaler as follows: 1-2 puffs with spacer every 4 hours as needed for wheezing, cough, or shortness of breath.   Prednisone - This is an oral steroid.  This medication is best taken with food in the morning.  Please note that this medication can cause anxiety, mood swings, muscle fatigue, increased hunger, weight gain (sodium/fluid retention), poor sleep as well as other symptoms. If you are a diabetic, please monitor your blood sugars at home as this medication can increase your blood sugars.   What is Pneumonia? - Pneumonia is an infection of the lungs.  CAUSES Pneumonia may be caused by bacteria or a virus. Usually, these infections are caused by breathing infectious particles into the lungs (respiratory tract). SYMPTOMS  Cough.  Fever (>100.4) Chest pain.  Increased rate of breathing.  Wheezing.  Mucus production.   DIAGNOSIS  If you have the common symptoms of pneumonia, your caregiver will typically confirm the diagnosis with a chest X-ray. The X-ray will show an abnormality in the lung (pulmonary infiltrate) if you have pneumonia. Other tests of your blood, urine, or sputum may be done to find the specific cause of your pneumonia. Your caregiver may also do tests (blood gases or pulse oximetry) to see how well your lungs are working. TREATMENT  Some forms of pneumonia may be spread to other people when you cough or sneeze. You may be asked to wear a mask before and during your exam. Pneumonia that is caused by bacteria is treated with antibiotic medicine. Pneumonia that is caused by the influenza virus may be treated with an antiviral medicine. Most other viral infections must run their course. These infections will not respond to antibiotics.  PREVENTION A pneumococcal shot (vaccine) is available to prevent a common bacterial cause of pneumonia. This is usually suggested for: People over 73 years old.  Patients on chemotherapy.  People with chronic lung problems, such as bronchitis or emphysema.  People with immune system problems.  If you are over 65 or have a high-risk condition, you may receive the pneumococcal vaccine if you have not received it before. A routine influenza vaccine is also recommended. This vaccine can help prevent some cases of pneumonia If you smoke, it is time to quit. You may receive instructions and counseling on how to stop smoking.  HOME CARE INSTRUCTIONS  Cough suppressants may be used if you are losing too much rest. However,  coughing protects you by clearing your lungs. You should avoid using cough suppressants if you can.  Your caregiver may have prescribed medicine if he or she thinks your pneumonia is caused by a bacteria or influenza. Finish your medicine even if you start to feel better.  Your caregiver may also prescribe an expectorant. This loosens the mucus to be  coughed up.  Only take over-the-counter or prescription medicines for pain, discomfort, or fever as directed by your caregiver.  Do not smoke. Smoking is a common cause of bronchitis and can contribute to pneumonia. If you are a smoker and continue to smoke, your cough may last several weeks after your pneumonia has cleared.  A cold steam vaporizer or humidifier in your room or home may help loosen mucus.  Coughing is often worse at night. Sleeping in a semi-upright position in a recliner or using a couple pillows under your head will help with this.  Get rest as you feel it is needed. Your body will usually let you know when you need to rest.  SEEK IMMEDIATE MEDICAL CARE IF:  Your illness becomes worse. This is especially true if you are elderly or weakened from any other disease.  You cannot control your cough with suppressants and are losing sleep.  You begin coughing up blood.  You develop pain which is getting worse or is uncontrolled with medicines.  You have a fever (100.72F).  Any of the symptoms which initially brought you in for treatment are getting worse rather than better.  You develop shortness of breath or chest pain.

## 2017-12-28 NOTE — ED Triage Notes (Signed)
Pt reports that she had been coughing that is non productive for couple days and SOB with exertion. Pt c/o chest tightness x week and sore throat.

## 2017-12-28 NOTE — ED Notes (Signed)
Pt ambulated without difficulty. Pts O2 sat 96-94% RA during ambulation

## 2018-01-30 ENCOUNTER — Other Ambulatory Visit: Payer: Self-pay | Admitting: Internal Medicine

## 2018-02-05 ENCOUNTER — Emergency Department (HOSPITAL_COMMUNITY): Payer: Medicaid Other

## 2018-02-05 ENCOUNTER — Encounter (HOSPITAL_COMMUNITY): Payer: Self-pay | Admitting: Emergency Medicine

## 2018-02-05 ENCOUNTER — Other Ambulatory Visit: Payer: Self-pay

## 2018-02-05 ENCOUNTER — Emergency Department (HOSPITAL_COMMUNITY)
Admission: EM | Admit: 2018-02-05 | Discharge: 2018-02-05 | Disposition: A | Discharge status: Home / Self Care | Payer: Medicaid Other | Attending: Emergency Medicine | Admitting: Emergency Medicine

## 2018-02-05 DIAGNOSIS — Z87891 Personal history of nicotine dependence: Secondary | ICD-10-CM | POA: Diagnosis not present

## 2018-02-05 DIAGNOSIS — I10 Essential (primary) hypertension: Secondary | ICD-10-CM | POA: Insufficient documentation

## 2018-02-05 DIAGNOSIS — F0390 Unspecified dementia without behavioral disturbance: Secondary | ICD-10-CM | POA: Insufficient documentation

## 2018-02-05 DIAGNOSIS — J441 Chronic obstructive pulmonary disease with (acute) exacerbation: Secondary | ICD-10-CM | POA: Diagnosis not present

## 2018-02-05 DIAGNOSIS — R0602 Shortness of breath: Secondary | ICD-10-CM | POA: Diagnosis present

## 2018-02-05 DIAGNOSIS — Z79899 Other long term (current) drug therapy: Secondary | ICD-10-CM | POA: Insufficient documentation

## 2018-02-05 LAB — CBC WITH DIFFERENTIAL/PLATELET
Abs Immature Granulocytes: 0.06 10*3/uL (ref 0.00–0.07)
Basophils Absolute: 0.1 10*3/uL (ref 0.0–0.1)
Basophils Relative: 1 %
Eosinophils Absolute: 0.2 10*3/uL (ref 0.0–0.5)
Eosinophils Relative: 4 %
HCT: 43.1 % (ref 36.0–46.0)
Hemoglobin: 13 g/dL (ref 12.0–15.0)
Immature Granulocytes: 1 %
Lymphocytes Relative: 21 %
Lymphs Abs: 1.4 10*3/uL (ref 0.7–4.0)
MCH: 27.3 pg (ref 26.0–34.0)
MCHC: 30.2 g/dL (ref 30.0–36.0)
MCV: 90.5 fL (ref 80.0–100.0)
Monocytes Absolute: 0.7 10*3/uL (ref 0.1–1.0)
Monocytes Relative: 11 %
Neutro Abs: 4.1 10*3/uL (ref 1.7–7.7)
Neutrophils Relative %: 62 %
Platelets: 285 10*3/uL (ref 150–400)
RBC: 4.76 MIL/uL (ref 3.87–5.11)
RDW: 14.3 % (ref 11.5–15.5)
WBC: 6.6 10*3/uL (ref 4.0–10.5)
nRBC: 0 % (ref 0.0–0.2)

## 2018-02-05 LAB — BASIC METABOLIC PANEL
Anion gap: 10 (ref 5–15)
BUN: 12 mg/dL (ref 8–23)
CO2: 23 mmol/L (ref 22–32)
Calcium: 8.8 mg/dL — ABNORMAL LOW (ref 8.9–10.3)
Chloride: 107 mmol/L (ref 98–111)
Creatinine, Ser: 0.55 mg/dL (ref 0.44–1.00)
GFR calc Af Amer: >60 mL/min (ref >60)
GFR calc non Af Amer: >60 mL/min (ref >60)
Glucose, Bld: 111 mg/dL — ABNORMAL HIGH (ref 70–99)
Potassium: 4 mmol/L (ref 3.5–5.1)
Sodium: 140 mmol/L (ref 135–145)

## 2018-02-05 LAB — I-STAT TROPONIN, ED: Troponin i, poc: 0 ng/mL (ref 0.00–0.08)

## 2018-02-05 MED ORDER — PREDNISONE 20 MG PO TABS
60.0000 mg | ORAL_TABLET | Freq: Once | ORAL | Status: AC
  Administered 2018-02-05: 60 mg via ORAL
  Filled 2018-02-05: qty 3

## 2018-02-05 MED ORDER — PREDNISONE 10 MG (21) PO TBPK: ORAL_TABLET | Freq: Every day | ORAL | 0 refills | Status: DC

## 2018-02-05 MED ORDER — ALBUTEROL SULFATE HFA 108 (90 BASE) MCG/ACT IN AERS
1.0000 | INHALATION_SPRAY | Freq: Once | RESPIRATORY_TRACT | Status: AC
  Administered 2018-02-05: 1 via RESPIRATORY_TRACT
  Filled 2018-02-05: qty 6.7

## 2018-02-05 MED ORDER — IPRATROPIUM-ALBUTEROL 0.5-2.5 (3) MG/3ML IN SOLN
3.0000 mL | Freq: Once | RESPIRATORY_TRACT | Status: AC
  Administered 2018-02-05: 3 mL via RESPIRATORY_TRACT
  Filled 2018-02-05: qty 3

## 2018-02-05 MED ORDER — OPTICHAMBER DIAMOND MISC
Freq: Once | Status: DC
  Filled 2018-02-05: qty 1

## 2018-02-05 MED ORDER — AEROCHAMBER PLUS FLO-VU LARGE MISC: 1.0000 | Freq: Once | Status: DC

## 2018-02-05 NOTE — ED Provider Notes (Signed)
[facility] EMERGENCY DEPARTMENT Provider Note   CSN: [id number] Arrival date & time: 02/05/18  0846     History   Chief Complaint Chief Complaint  Patient presents with  . Shortness of Breath    HPI Jody Mcclure is a 62 y.o. female with a past medical history of hypertension, hyperlipidemia who presents to ED for shortness of breath, cough, sore throat.  States that she was seen and evaluated about 1 month ago and was diagnosed with bronchitis and walking pneumonia.  She had some improvement with the medications given to her but states that "I think I needed to be on it for longer."  She reports intermittent wheezing.  This episode has worsened for the past 7 days.  She ran out of her inhaler that she was given at the prior visit.  She reports intermittent chest tightness which was present last month as well.  She was diagnosed with asthma as a child but states that she "outgrew it."  Denies any productive cough or hemoptysis.  Reports intermittent chills but denies any fever.  No sick contacts with similar symptoms.  Denies any leg swelling, chest pain, recent immobilization, history of MI or PE.  She did not receive her influenza vaccine this year.  HPI  Past Medical History:  Diagnosis Date  . Depression   . Hyperlipidemia   . Hypertension     Patient Active Problem List   Diagnosis Date Noted  . Mild dementia (HCC) 11/15/2017  . Constipation 11/15/2017  . Encounter for screening mammogram for breast cancer 06/03/2016  . Lumbar radiculopathy, chronic 06/03/2016  . Generalized anxiety disorder 06/03/2016  . Healthcare maintenance 06/03/2016  . GERD (gastroesophageal reflux disease) 12/13/2012  . Depression 12/13/2012  . Dysphagia 04/18/2012  . Hypertension 08/15/2011  . Rash and nonspecific skin eruption 08/15/2011    Past Surgical History:  Procedure Laterality Date  . ABDOMINAL HYSTERECTOMY    . TONSILLECTOMY       OB History    Gravida  2   Para      Term      Preterm      AB      Living  2     SAB      TAB      Ectopic      Multiple      Live Births               Home Medications    Prior to Admission medications   Medication Sig Start Date End Date Taking? Authorizing Provider  amLODipine (NORVASC) 5 MG tablet TAKE 1 TABLET BY MOUTH EVERY DAY 01/30/18   Hoffman, Jessica Ratliff, DO  betamethasone valerate ointment (VALISONE) 0.1 % APPLY 1 APPLICATION TOPICALLY 2 (TWO) TIMES DAILY. 11/16/17   Hoffman, Shanda Bumps Ratliff, DO  cyclobenzaprine (FLEXERIL) 5 MG tablet TAKE 1 TABLET BY MOUTH THREE TIMES A DAY AS NEEDED FOR MUSCLE SPASMS Patient not taking: Reported on 12/28/2017 10/11/17   Levert Feinstein, MD  hydrOXYzine (ATARAX/VISTARIL) 10 MG tablet TAKE 1 TABLET BY MOUTH THREE TIMES A DAY AS NEEDED Patient not taking: Reported on 12/28/2017 10/11/17   Levert Feinstein, MD  ibuprofen (ADVIL,MOTRIN) 200 MG tablet Take 400 mg by mouth every 6 (six) hours as needed for headache, mild pain or moderate pain.    [provider]  predniSONE (STERAPRED UNI-PAK 21 TAB) 10 MG (21) TBPK tablet Take by mouth daily. Take 6 tabs by mouth daily  for 1  days, then 5 tabs for 2 days, then 4 tabs for 2 days, then 3 tabs for 2 days, 2 tabs for 2 days, then 1 tab by mouth daily for 2 days 02/05/18   Idelle Leech, Ramaj Frangos, PA-C  senna-docusate (SENOKOT-S) 8.6-50 MG tablet Take 1 tablet by mouth daily. Patient not taking: Reported on 12/28/2017 11/15/17   Geralyn Corwin Ratliff, DO  sertraline (ZOLOFT) 50 MG tablet Take 3 tablets (150 mg total) by mouth daily. 11/15/17   Geralyn Corwin Ratliff, DO  omeprazole (PRILOSEC OTC) 20 MG tablet Take 1 tablet (20 mg total) by mouth daily. 12/13/12 04/09/13  Reva Bores, MD    Family History Family History  Problem Relation Age of Onset  . Cancer Mother        lung  . Hyperlipidemia Mother   . Hypertension Father   . Hyperlipidemia Father   . Colon cancer Neg Hx   . Colon polyps Neg Hx    . Rectal cancer Neg Hx   . Stomach cancer Neg Hx     Social History Social History   Tobacco Use  . Smoking status: Former Smoker    Packs/day: 0.50    Types: Cigarettes  . Smokeless tobacco: Never Used  . Tobacco comment: 2 per week  Substance Use Topics  . Alcohol use: Yes    Alcohol/week: 2.0 standard drinks    Types: 2 Cans of beer per week    Comment: 1 daily  . Drug use: No     Allergies   Codeine and Sulfa antibiotics   Review of Systems Review of Systems  Constitutional: Negative for appetite change, chills and fever.  HENT: Positive for sinus pressure and sore throat. Negative for ear pain, rhinorrhea and sneezing.   Eyes: Negative for photophobia and visual disturbance.  Respiratory: Positive for cough, shortness of breath and wheezing. Negative for chest tightness.   Cardiovascular: Negative for chest pain and palpitations.  Gastrointestinal: Negative for abdominal pain, blood in stool, constipation, diarrhea, nausea and vomiting.  Genitourinary: Negative for dysuria, hematuria and urgency.  Musculoskeletal: Negative for myalgias.  Skin: Negative for rash.  Neurological: Negative for dizziness, weakness and light-headedness.     Physical Exam Updated Vital Signs BP 134/89   Pulse 74   Temp 98.2 F (36.8 C) (Oral)   Resp 18   SpO2 98%   Physical Exam  Constitutional: She appears well-developed and well-nourished. No distress.  HENT:  Head: Normocephalic and atraumatic.  Right Ear: Tympanic membrane normal.  Left Ear: Tympanic membrane normal.  Nose: Nose normal.  Mouth/Throat: Uvula is midline and oropharynx is clear and moist. Tonsils are 0 on the right. Tonsils are 0 on the left. No tonsillar exudate.  Patient does not appear to be in acute distress. No trismus or drooling present. No pooling of secretions. Patient is tolerating secretions and is not in respiratory distress. No neck pain or tenderness to palpation of the neck. Full active and  passive range of motion of the neck. No evidence of RPA or PTA.  Eyes: Conjunctivae and EOM are normal. Left eye exhibits no discharge. No scleral icterus.  Neck: Normal range of motion. Neck supple.  Cardiovascular: Normal rate, regular rhythm, normal heart sounds and intact distal pulses. Exam reveals no gallop and no friction rub.  No murmur heard. Pulmonary/Chest: Effort normal. No respiratory distress. She has wheezes (Faint, expiratory).  Abdominal: Soft. Bowel sounds are normal. She exhibits no distension. There is no tenderness. There is no guarding.  Musculoskeletal:  Normal range of motion. She exhibits no edema.  Neurological: She is alert. She exhibits normal muscle tone. Coordination normal.  Skin: Skin is warm and dry. No rash noted.  Psychiatric: She has a normal mood and affect.  Nursing note and vitals reviewed.    ED Treatments / Results  Labs (all labs ordered are listed, but only abnormal results are displayed) Labs Reviewed  BASIC METABOLIC PANEL - Abnormal; Notable for the following components:      Result Value   Glucose, Bld 111 (*)    Calcium 8.8 (*)    All other components within normal limits  CBC WITH DIFFERENTIAL/PLATELET  I-STAT TROPONIN, ED    EKG None  Radiology Dg Chest 2 View  Result Date: 02/05/2018 CLINICAL DATA:  Shortness of Breath EXAM: CHEST - 2 VIEW COMPARISON:  12/28/2017 FINDINGS: Biapical scarring, stable. Lungs otherwise clear. Heart is normal size. No effusions or acute bony abnormality. IMPRESSION: Biapical scarring.  No active disease. Electronically Signed   By: Charlett Nose M.D.   On: 02/05/2018 09:49    Procedures Procedures (including critical care time)  Medications Ordered in ED Medications  albuterol (PROVENTIL HFA;VENTOLIN HFA) 108 (90 Base) MCG/ACT inhaler 1 puff (has no administration in time range)  optichamber diamond (has no administration in time range)  predniSONE (DELTASONE) tablet 60 mg (has no administration  in time range)  ipratropium-albuterol (DUONEB) 0.5-2.5 (3) MG/3ML nebulizer solution 3 mL (3 mLs Nebulization Given 02/05/18 0955)     Initial Impression / Assessment and Plan / ED Course  I have reviewed the triage vital signs and the nursing notes.  Pertinent labs & imaging results that were available during my care of the patient were reviewed by me and considered in my medical decision making (see chart for details).     62 year old female with past medical history of hypertension, hyperlipidemia presents to ED for shortness of breath, cough and sore throat.  She was seen and evaluated on 12/28/2017 was diagnosed with bronchitis and pneumonia.  She completed the course of the medications that were given to her but states that symptoms recurred 1 week ago.  Reports intermittent chest tightness.  She was diagnosed with asthma as a child but states that she outgrew it.  Denies productive cough or hemoptysis, fever.  No sick contacts with similar symptoms.  On exam there is faint expiratory wheezing noted in bilateral lung fields.  She is speaking complete sentences in no acute distress.  Chest x-ray shows no acute abnormalities.  CBC, BMP, troponin unremarkable.  EKG shows normal sinus rhythm.  Patient was given a breathing treatment here with significant improvement in her symptoms.  States that she feels "a lot more open."  Suspect that symptoms are due to COPD exacerbation. Doubt cardiac cause or other pulmonary cause such as PE as she denies any pleuritic chest pain and is not hypoxic, tachycardic or tachypnec. Will treat with steroids and refill of inhaler.  Advised to return to ED for any severe worsening symptoms.  Patient is hemodynamically stable, in NAD, and able to ambulate in the ED. Evaluation does not show pathology that would require ongoing emergent intervention or inpatient treatment. I explained the diagnosis to the patient. Pain has been managed and has no complaints prior to  discharge. Patient is comfortable with above plan and is stable for discharge at this time. All questions were answered prior to disposition. Strict return precautions for returning to the ED were discussed. Encouraged follow up with PCP.  Portions of this note were generated with Scientist, clinical (histocompatibility and immunogenetics). Dictation errors may occur despite best attempts at proofreading.  Final Clinical Impressions(s) / ED Diagnoses   Final diagnoses:  COPD exacerbation [facility])    ED Discharge Orders         Ordered    predniSONE (STERAPRED UNI-PAK 21 TAB) 10 MG (21) TBPK tablet  Daily     02/05/18 1119           Dietrich Pates, PA-C 02/05/18 1125    Pricilla Loveless, MD 02/05/18 1521

## 2018-02-05 NOTE — Discharge Instructions (Signed)
Return to ED for worsening symptoms, severe chest pain, vomiting or coughing up blood, lightheadedness or loss of consciousness.

## 2018-02-05 NOTE — ED Triage Notes (Signed)
Sob x  1 week was seen here before nand given inhalers and steroids now she feels like she is sick again , her throat is sore

## 2018-02-05 NOTE — ED Notes (Signed)
Pt discharged from ED; instructions provided and scripts given; Pt encouraged to return to ED if symptoms worsen and to f/u with PCP; Pt verbalized understanding of all instructions 

## 2018-03-05 ENCOUNTER — Other Ambulatory Visit: Payer: Self-pay

## 2018-03-05 ENCOUNTER — Ambulatory Visit (INDEPENDENT_AMBULATORY_CARE_PROVIDER_SITE_OTHER): Payer: Medicaid Other | Admitting: Internal Medicine

## 2018-03-05 ENCOUNTER — Ambulatory Visit (HOSPITAL_COMMUNITY)
Admission: RE | Admit: 2018-03-05 | Discharge: 2018-03-05 | Disposition: A | Discharge status: Home / Self Care | Payer: Medicaid Other | Source: Ambulatory Visit | Attending: Family Medicine | Admitting: Family Medicine

## 2018-03-05 VITALS — BP 134/66 | HR 92 | Temp 98.0°F | Ht 60.0 in | Wt 141.1 lb

## 2018-03-05 DIAGNOSIS — J302 Other seasonal allergic rhinitis: Secondary | ICD-10-CM | POA: Diagnosis not present

## 2018-03-05 DIAGNOSIS — R0609 Other forms of dyspnea: Secondary | ICD-10-CM | POA: Diagnosis not present

## 2018-03-05 DIAGNOSIS — Z87891 Personal history of nicotine dependence: Secondary | ICD-10-CM

## 2018-03-05 DIAGNOSIS — R0600 Dyspnea, unspecified: Secondary | ICD-10-CM

## 2018-03-05 DIAGNOSIS — R9431 Abnormal electrocardiogram [ECG] [EKG]: Secondary | ICD-10-CM | POA: Insufficient documentation

## 2018-03-05 DIAGNOSIS — J449 Chronic obstructive pulmonary disease, unspecified: Secondary | ICD-10-CM | POA: Insufficient documentation
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
## 2018-03-05 NOTE — Patient Instructions (Addendum)
Thank you for coming to the clinic today. It was a pleasure to see you.   Please schedule pulmonary function test, if you havent heard from someone about scheduling these within the next two weeks please give our office a call   FOLLOW-UP INSTRUCTIONS When: 1-2 months with Dr. Mikey BussingHoffman  What to bring: all of your medication bottles   Please call the internal medicine center clinic if you have any questions or concerns, we may be able to help and keep you from a long and expensive emergency room wait. Our clinic and after hours phone number is [phone], the best time to call is Monday through Friday 9 am to 4 pm but there is always someone available 24/7 if you have an emergency. If you need medication refills please notify your pharmacy one week in advance and they will send us a request.

## 2018-03-07 NOTE — Progress Notes (Signed)
Internal Medicine Clinic Attending  Case discussed with Dr. Blum at the time of the visit.  We reviewed the resident's history and exam and pertinent patient test results.  I agree with the assessment, diagnosis, and plan of care documented in the resident's note.  Alexander Raines, M.D., Ph.D.  

## 2018-03-16 ENCOUNTER — Ambulatory Visit: Payer: Medicaid Other

## 2018-03-29 ENCOUNTER — Other Ambulatory Visit: Payer: Self-pay | Admitting: Internal Medicine

## 2018-03-30 ENCOUNTER — Encounter (INDEPENDENT_AMBULATORY_CARE_PROVIDER_SITE_OTHER): Payer: Self-pay

## 2018-03-30 ENCOUNTER — Encounter: Payer: Self-pay | Admitting: Internal Medicine

## 2018-03-30 ENCOUNTER — Other Ambulatory Visit: Payer: Self-pay

## 2018-03-30 ENCOUNTER — Ambulatory Visit (INDEPENDENT_AMBULATORY_CARE_PROVIDER_SITE_OTHER): Payer: Medicaid Other | Admitting: Internal Medicine

## 2018-03-30 VITALS — BP 106/63 | HR 81 | Temp 97.7°F | Ht 60.0 in | Wt 139.6 lb

## 2018-03-30 DIAGNOSIS — Z8701 Personal history of pneumonia (recurrent): Secondary | ICD-10-CM | POA: Diagnosis not present

## 2018-03-30 DIAGNOSIS — J449 Chronic obstructive pulmonary disease, unspecified: Secondary | ICD-10-CM | POA: Diagnosis not present

## 2018-03-30 DIAGNOSIS — R0600 Dyspnea, unspecified: Secondary | ICD-10-CM

## 2018-03-30 DIAGNOSIS — R0609 Other forms of dyspnea: Principal | ICD-10-CM

## 2018-03-30 DIAGNOSIS — N393 Stress incontinence (female) (male): Secondary | ICD-10-CM | POA: Diagnosis not present

## 2018-03-30 MED ORDER — DEPEND EASY FIT UNDERGARMENTS MISC: 1.0000 [IU] | Freq: Three times a day (TID) | 11 refills | Status: DC

## 2018-03-30 NOTE — Patient Instructions (Signed)
[name],  It was a pleasure meeting you today.  Pulmonary function tests have been ordered. Someone will call you to schedule to have these done.

## 2018-03-30 NOTE — Progress Notes (Signed)
   CC: shortness of breath  HPI:  [name] is a 62 y.o. Female with history noted below that presents to the acute care clinic for follow up on dyspnea on exertion.  Please see problem based charting for the status of patient's chronic medical conditions.  Past Medical History:  Diagnosis Date  . Depression   . Hyperlipidemia   . Hypertension     Review of Systems:  Review of Systems  Respiratory: Positive for shortness of breath. Negative for cough, sputum production and wheezing.   Cardiovascular: Negative for chest pain, orthopnea and leg swelling.     Physical Exam:  Vitals:   03/30/18 1323  BP: 106/63  Pulse: 81  Temp: 97.7 F (36.5 C)  TempSrc: Oral  SpO2: 94%  Weight: 139 lb 9.6 oz (63.3 kg)  Height: 5' (1.524 m)   Physical Exam  Constitutional: She is well-developed, well-nourished, and in no distress.  Cardiovascular: Normal rate, regular rhythm and normal heart sounds. Exam reveals no gallop and no friction rub.  No murmur heard. Pulmonary/Chest: Effort normal and breath sounds normal. No respiratory distress. She has no wheezes. She has no rales.     Assessment & Plan:   See encounters tab for problem based medical decision making.   Patient discussed with Dr. Oswaldo DoneVincent

## 2018-04-01 DIAGNOSIS — N393 Stress incontinence (female) (male): Secondary | ICD-10-CM | POA: Insufficient documentation

## 2018-04-01 NOTE — Assessment & Plan Note (Signed)
HPI:  patient states she has urinary leakage after coughing, sneezing and laughing. She states her symptoms haver worsened over the past 2 years and the leakage soaks through her underwear.   Assessment:  stress incontinence Will order adult diapers  Plan -adult diapers

## 2018-04-01 NOTE — Assessment & Plan Note (Signed)
HPI:  Patient states she was diagnosed with pneumonia a few months ago, followed by a COPD exacerbation.  She states she continues to feel short of breath when she walks short distances.  She denies fever/chils, orthopnea, cough or wheezing.  Assessment:  Dyspnea on exertion Patient was diagnosed with pneumonia 12/2017.  The following month she presented with wheezing and cough to the emergency department and was treated for a COPD exacerbation.  No PFTs on file.  At this time she states she continues to feel short of breath with little exertion.  O2 stats fine at rest.  Chest x-ray in October showed apical scarring.  Will obtain PFTs   Plan -PFTs -albuterol inhaler given in office

## 2018-04-02 NOTE — Progress Notes (Signed)
Internal Medicine Clinic Attending  Case discussed with Dr. Hoffman at the time of the visit.  We reviewed the resident's history and exam and pertinent patient test results.  I agree with the assessment, diagnosis, and plan of care documented in the resident's note.  

## 2018-05-11 ENCOUNTER — Inpatient Hospital Stay (HOSPITAL_COMMUNITY): Admission: RE | Admit: 2018-05-11 | Payer: Medicaid Other | Source: Ambulatory Visit

## 2018-05-29 ENCOUNTER — Ambulatory Visit (HOSPITAL_COMMUNITY)
Admission: RE | Admit: 2018-05-29 | Discharge: 2018-05-29 | Disposition: A | Discharge status: Home / Self Care | Payer: Medicaid Other | Source: Ambulatory Visit | Attending: Internal Medicine | Admitting: Internal Medicine

## 2018-05-29 DIAGNOSIS — R0609 Other forms of dyspnea: Secondary | ICD-10-CM | POA: Insufficient documentation

## 2018-05-29 DIAGNOSIS — R0600 Dyspnea, unspecified: Secondary | ICD-10-CM

## 2018-05-29 LAB — PULMONARY FUNCTION TEST
DL/VA % pred: 135 %
DL/VA: 5.87 ml/min/mmHg/L
DLCO unc % pred: 100 %
DLCO unc: 17.61 ml/min/mmHg
FEF 25-75 Post: 0.97 L/sec
FEF 25-75 Pre: 0.57 L/sec
FEF2575-%Change-Post: 71 %
FEF2575-%Pred-Post: 46 %
FEF2575-%Pred-Pre: 27 %
FEV1-%Change-Post: 21 %
FEV1-%Pred-Post: 60 %
FEV1-%Pred-Pre: 49 %
FEV1-Post: 1.31 L
FEV1-Pre: 1.08 L
FEV1FVC-%Change-Post: 6 %
FEV1FVC-%Pred-Pre: 76 %
FEV6-%Change-Post: 15 %
FEV6-%Pred-Post: 75 %
FEV6-%Pred-Pre: 64 %
FEV6-Post: 2.03 L
FEV6-Pre: 1.75 L
FEV6FVC-%Change-Post: 1 %
FEV6FVC-%Pred-Post: 102 %
FEV6FVC-%Pred-Pre: 100 %
FVC-%Change-Post: 13 %
FVC-%Pred-Post: 73 %
FVC-%Pred-Pre: 64 %
FVC-Post: 2.06 L
FVC-Pre: 1.81 L
Post FEV1/FVC ratio: 64 %
Post FEV6/FVC ratio: 99 %
Pre FEV1/FVC ratio: 60 %
Pre FEV6/FVC Ratio: 97 %

## 2018-05-29 MED ORDER — ALBUTEROL SULFATE (2.5 MG/3ML) 0.083% IN NEBU
2.5000 mg | INHALATION_SOLUTION | Freq: Once | RESPIRATORY_TRACT | Status: AC
  Administered 2018-05-29: 2.5 mg via RESPIRATORY_TRACT

## 2018-06-18 ENCOUNTER — Other Ambulatory Visit: Payer: Self-pay

## 2018-06-18 NOTE — Telephone Encounter (Signed)
sertraline (ZOLOFT) 50 MG tablet, REFILL REQUEST @  [facility] [phone] [place], [place] - [address] [phone] (Phone) [phone] (Fax)

## 2018-06-19 ENCOUNTER — Other Ambulatory Visit: Payer: Self-pay | Admitting: Internal Medicine

## 2018-06-19 MED ORDER — SERTRALINE HCL 50 MG PO TABS: 150.0000 mg | ORAL_TABLET | Freq: Every day | ORAL | 2 refills | Status: DC

## 2018-06-22 ENCOUNTER — Ambulatory Visit: Payer: Medicaid Other

## 2018-06-26 ENCOUNTER — Ambulatory Visit: Payer: Medicaid Other

## 2018-08-09 ENCOUNTER — Other Ambulatory Visit: Payer: Self-pay

## 2018-08-09 ENCOUNTER — Ambulatory Visit (INDEPENDENT_AMBULATORY_CARE_PROVIDER_SITE_OTHER): Payer: Medicaid Other | Admitting: Internal Medicine

## 2018-08-09 DIAGNOSIS — R0609 Other forms of dyspnea: Secondary | ICD-10-CM

## 2018-08-09 DIAGNOSIS — R21 Rash and other nonspecific skin eruption: Secondary | ICD-10-CM

## 2018-08-09 DIAGNOSIS — I10 Essential (primary) hypertension: Secondary | ICD-10-CM | POA: Diagnosis not present

## 2018-08-09 DIAGNOSIS — R0600 Dyspnea, unspecified: Secondary | ICD-10-CM

## 2018-08-12 MED ORDER — UMECLIDINIUM-VILANTEROL 62.5-25 MCG/INH IN AEPB: 1.0000 | INHALATION_SPRAY | Freq: Every day | RESPIRATORY_TRACT | 2 refills | Status: DC

## 2018-08-12 NOTE — Assessment & Plan Note (Signed)
HPI:  Patient states she continues to take amlodipine 5mg  daily without side effects.  Assessment:  Essential hypertension Unable to do vitals due to virtual visit.  Will have patient follow up in 1-2 months for blood pressure check  Plan -continue amlodipine -blood pressure check in 1-2 months

## 2018-08-12 NOTE — Assessment & Plan Note (Signed)
HPI:  Patient reports some improvement in breathing in the past 5 months but continues to have dyspnea on exertion.  She has an albuterol inhaler she uses occasionally.  Assessment:  COPD Patien'ts pft shows severe obstruction.  Will prescribe laba/lama and if this does not show improvement will refer to pulmonolgy  Plan -anoro

## 2018-08-12 NOTE — Assessment & Plan Note (Addendum)
HPI:  Patient states she continues to have a rash on her arms.  This is chronic and has improved in the past with prednisone given for a COPD exacerbation.  She states bethamethsone valerate provides little benefit.  Patient was had this issue in the past and dermatology referral was placed however patient states she was never called.  Assessment:  Rash  Chronic.  Told patient to call office to be seen in person if rash becomes worse or there are new features.  At this time will try to follow up on previous referral  Plan -try hydrocortisone cream -follow up on previous referral to dermatology

## 2018-08-12 NOTE — Progress Notes (Signed)
  [facility] Internal Medicine Residency Telephone Encounter Continuity Care Appointment  HPI:   This telephone encounter was created for [name] on 08/09/2018 for the following purpose/cc follow up on essential hypertension.   Past Medical History:  Past Medical History:  Diagnosis Date  . Depression   . Hyperlipidemia   . Hypertension       ROS:  Review of Systems  Constitutional: Negative for malaise/fatigue.  Respiratory: Negative for cough and sputum production.   Cardiovascular: Negative for chest pain.  Neurological: Negative for dizziness.     Assessment / Plan / Recommendations:   Please see A&P under problem oriented charting for assessment of the patient's acute and chronic medical conditions.   As always, pt is advised that if symptoms worsen or new symptoms arise, they should go to an urgent care facility or to to ER for further evaluation.   Consent and Medical Decision Making:   Patient discussed with Dr. Heide Spark  This is a telephone encounter between Jody Mcclure and Aldean Baker on 08/09/2018 for follow up on hypertension. The visit was conducted with the patient located at home and Aldean Baker at [facility]. The patient's identity was confirmed using their DOB. The patient has consented to being evaluated through a telephone encounter and understands the associated risks (an examination cannot be done and the patient may need to come in for an appointment) / benefits (allows the patient to remain at home, decreasing exposure to coronavirus). I personally spent 13 minutes on medical discussion.

## 2018-08-13 NOTE — Progress Notes (Signed)
Internal Medicine Clinic Attending  Case discussed with Dr. Hoffman at the time of the visit.  We reviewed the resident's history and exam and pertinent patient test results.  I agree with the assessment, diagnosis, and plan of care documented in the resident's note.  

## 2018-08-14 ENCOUNTER — Telehealth: Payer: Self-pay | Admitting: *Deleted

## 2018-08-14 NOTE — Telephone Encounter (Addendum)
Patient has been prescribed Anoro. Anoro is not a preferred medication for Medicaid.  Patient will need to try and fail Spiriva Handi, Combivent, Atrovent, Ipratropium Neb and Albuterol neb,  Stiolto or Bevespi Aeroshere.  Message to be sent to Dr. Louanne Belton to consider a change.  Angelina Ok, RN 08/14/2018 2:41 PM.

## 2018-08-15 ENCOUNTER — Other Ambulatory Visit: Payer: Self-pay | Admitting: Internal Medicine

## 2018-08-15 MED ORDER — TIOTROPIUM BROMIDE-OLODATEROL 2.5-2.5 MCG/ACT IN AERS: 2.0000 | INHALATION_SPRAY | Freq: Every day | RESPIRATORY_TRACT | 2 refills | Status: DC

## 2018-08-15 NOTE — Telephone Encounter (Signed)
Sent Stiolto to the pharmacy. Thanks!

## 2018-08-20 ENCOUNTER — Other Ambulatory Visit: Payer: Self-pay | Admitting: Internal Medicine

## 2018-08-21 ENCOUNTER — Other Ambulatory Visit: Payer: Self-pay | Admitting: Internal Medicine

## 2018-08-21 NOTE — Telephone Encounter (Signed)
betamethasone valerate ointment (VALISONE) 0.1 %, refill request @  [facility] [phone] [place], [place] - [address] [phone] (Phone) [phone] (Fax)

## 2018-08-23 ENCOUNTER — Emergency Department (HOSPITAL_COMMUNITY)
Admission: EM | Admit: 2018-08-23 | Discharge: 2018-08-23 | Disposition: A | Discharge status: Home / Self Care | Payer: Medicaid Other | Attending: Emergency Medicine | Admitting: Emergency Medicine

## 2018-08-23 ENCOUNTER — Emergency Department (HOSPITAL_COMMUNITY): Payer: Medicaid Other

## 2018-08-23 ENCOUNTER — Encounter (HOSPITAL_COMMUNITY): Payer: Self-pay | Admitting: *Deleted

## 2018-08-23 ENCOUNTER — Other Ambulatory Visit: Payer: Self-pay

## 2018-08-23 DIAGNOSIS — Y999 Unspecified external cause status: Secondary | ICD-10-CM | POA: Insufficient documentation

## 2018-08-23 DIAGNOSIS — Y929 Unspecified place or not applicable: Secondary | ICD-10-CM | POA: Insufficient documentation

## 2018-08-23 DIAGNOSIS — Z87828 Personal history of other (healed) physical injury and trauma: Secondary | ICD-10-CM | POA: Diagnosis not present

## 2018-08-23 DIAGNOSIS — I10 Essential (primary) hypertension: Secondary | ICD-10-CM | POA: Insufficient documentation

## 2018-08-23 DIAGNOSIS — Z87891 Personal history of nicotine dependence: Secondary | ICD-10-CM | POA: Diagnosis not present

## 2018-08-23 DIAGNOSIS — R404 Transient alteration of awareness: Secondary | ICD-10-CM | POA: Diagnosis not present

## 2018-08-23 DIAGNOSIS — F10920 Alcohol use, unspecified with intoxication, uncomplicated: Secondary | ICD-10-CM | POA: Diagnosis not present

## 2018-08-23 DIAGNOSIS — R296 Repeated falls: Secondary | ICD-10-CM | POA: Insufficient documentation

## 2018-08-23 DIAGNOSIS — Z79899 Other long term (current) drug therapy: Secondary | ICD-10-CM | POA: Insufficient documentation

## 2018-08-23 DIAGNOSIS — Y939 Activity, unspecified: Secondary | ICD-10-CM | POA: Insufficient documentation

## 2018-08-23 DIAGNOSIS — R4020 Unspecified coma: Secondary | ICD-10-CM | POA: Diagnosis not present

## 2018-08-23 DIAGNOSIS — R000 Tachycardia, unspecified: Secondary | ICD-10-CM | POA: Diagnosis not present

## 2018-08-23 DIAGNOSIS — Z043 Encounter for examination and observation following other accident: Secondary | ICD-10-CM | POA: Diagnosis present

## 2018-08-23 DIAGNOSIS — W1830XA Fall on same level, unspecified, initial encounter: Secondary | ICD-10-CM | POA: Diagnosis not present

## 2018-08-23 DIAGNOSIS — F1490 Cocaine use, unspecified, uncomplicated: Secondary | ICD-10-CM | POA: Insufficient documentation

## 2018-08-23 DIAGNOSIS — Y906 Blood alcohol level of 120-199 mg/100 ml: Secondary | ICD-10-CM | POA: Diagnosis not present

## 2018-08-23 HISTORY — DX: Anxiety disorder, unspecified: F41.9

## 2018-08-23 LAB — COMPREHENSIVE METABOLIC PANEL
ALT: 23 U/L (ref 0–44)
AST: 33 U/L (ref 15–41)
Albumin: 4 g/dL (ref 3.5–5.0)
Alkaline Phosphatase: 64 U/L (ref 38–126)
Anion gap: 14 (ref 5–15)
BUN: 6 mg/dL — ABNORMAL LOW (ref 8–23)
CO2: 18 mmol/L — ABNORMAL LOW (ref 22–32)
Calcium: 8.9 mg/dL (ref 8.9–10.3)
Chloride: 103 mmol/L (ref 98–111)
Creatinine, Ser: 0.77 mg/dL (ref 0.44–1.00)
GFR calc Af Amer: >60 mL/min (ref >60)
GFR calc non Af Amer: >60 mL/min (ref >60)
Glucose, Bld: 143 mg/dL — ABNORMAL HIGH (ref 70–99)
Potassium: 3.5 mmol/L (ref 3.5–5.1)
Sodium: 135 mmol/L (ref 135–145)
Total Bilirubin: 0.5 mg/dL (ref 0.3–1.2)
Total Protein: 7.2 g/dL (ref 6.5–8.1)

## 2018-08-23 LAB — CBC WITH DIFFERENTIAL/PLATELET
Abs Immature Granulocytes: 0.02 10*3/uL (ref 0.00–0.07)
Basophils Absolute: 0.1 10*3/uL (ref 0.0–0.1)
Basophils Relative: 1 %
Eosinophils Absolute: 0.3 10*3/uL (ref 0.0–0.5)
Eosinophils Relative: 5 %
HCT: 45.7 % (ref 36.0–46.0)
Hemoglobin: 14.3 g/dL (ref 12.0–15.0)
Immature Granulocytes: 0 %
Lymphocytes Relative: 47 %
Lymphs Abs: 2.8 10*3/uL (ref 0.7–4.0)
MCH: 26.1 pg (ref 26.0–34.0)
MCHC: 31.3 g/dL (ref 30.0–36.0)
MCV: 83.4 fL (ref 80.0–100.0)
Monocytes Absolute: 0.6 10*3/uL (ref 0.1–1.0)
Monocytes Relative: 10 %
Neutro Abs: 2.2 10*3/uL (ref 1.7–7.7)
Neutrophils Relative %: 37 %
Platelets: 301 10*3/uL (ref 150–400)
RBC: 5.48 MIL/uL — ABNORMAL HIGH (ref 3.87–5.11)
RDW: 18.6 % — ABNORMAL HIGH (ref 11.5–15.5)
WBC: 5.9 10*3/uL (ref 4.0–10.5)
nRBC: 0 % (ref 0.0–0.2)

## 2018-08-23 LAB — PROTIME-INR
INR: 1.1 (ref 0.8–1.2)
Prothrombin Time: 13.9 seconds (ref 11.4–15.2)

## 2018-08-23 LAB — ETHANOL: Alcohol, Ethyl (B): 120 mg/dL — ABNORMAL HIGH (ref <10)

## 2018-08-23 LAB — AMMONIA: Ammonia: 37 umol/L — ABNORMAL HIGH (ref 9–35)

## 2018-08-23 NOTE — ED Triage Notes (Signed)
The pt arrived by gems from home  The pt fell she has had 3 40 oz beers and cocaine  According to the female  At  The house he reports that he did cpr prior to ems  Ems not reporting cpr  Pt alert on arrival

## 2018-08-23 NOTE — ED Provider Notes (Signed)
[facility] EMERGENCY DEPARTMENT Provider Note   CSN: [id number] Arrival date & time: 08/23/18  0114    History   Chief Complaint Chief Complaint  Patient presents with   Fall    HPI Jody Mcclure is a 63 y.o. female.     Patient brought to the emergency department by ambulance from her home after what sounds like a fall.  Patient admits to drinking 340 ounce beers and smoking crack tonight.  Her significant other witnessed her fall to the ground and called EMS when he could not wake her up.  EMS reports that she was easily arousable and has been without complaints during transport.     Past Medical History:  Diagnosis Date   Anxiety    Depression    Hyperlipidemia    Hypertension     Patient Active Problem List   Diagnosis Date Noted   Stress incontinence 04/01/2018   Dyspnea on exertion 03/05/2018   Mild dementia (HCC) 11/15/2017   Constipation 11/15/2017   Encounter for screening mammogram for breast cancer 06/03/2016   Lumbar radiculopathy, chronic 06/03/2016   Generalized anxiety disorder 06/03/2016   Healthcare maintenance 06/03/2016   GERD (gastroesophageal reflux disease) 12/13/2012   Depression 12/13/2012   Dysphagia 04/18/2012   Hypertension 08/15/2011   Rash and nonspecific skin eruption 08/15/2011    Past Surgical History:  Procedure Laterality Date   ABDOMINAL HYSTERECTOMY     TONSILLECTOMY       OB History    Gravida  2   Para      Term      Preterm      AB      Living  2     SAB      TAB      Ectopic      Multiple      Live Births               Home Medications    Prior to Admission medications   Medication Sig Start Date End Date Taking? Authorizing Provider  amLODipine (NORVASC) 5 MG tablet TAKE 1 TABLET BY MOUTH EVERY DAY Patient taking differently: Take 5 mg by mouth daily.  08/20/18  Yes Hoffman, Jessica Ratliff, DO  betamethasone valerate ointment (VALISONE) 0.1 % APPLY  1 APPLICATION TOPICALLY 2 (TWO) TIMES DAILY. 08/22/18  Yes Hoffman, Jessica Ratliff, DO  sertraline (ZOLOFT) 50 MG tablet Take 3 tablets (150 mg total) by mouth daily. 06/19/18  Yes Hoffman, Jessica Ratliff, DO  Tiotropium Bromide-Olodaterol (STIOLTO RESPIMAT) 2.5-2.5 MCG/ACT AERS Inhale 2 puffs into the lungs daily. 08/15/18  Yes Hoffman, Jessica Ratliff, DO  cyclobenzaprine (FLEXERIL) 5 MG tablet TAKE 1 TABLET BY MOUTH THREE TIMES A DAY AS NEEDED FOR MUSCLE SPASMS Patient not taking: Reported on 12/28/2017 10/11/17   Levert Feinstein, MD  hydrOXYzine (ATARAX/VISTARIL) 10 MG tablet TAKE 1 TABLET BY MOUTH THREE TIMES A DAY AS NEEDED Patient not taking: Reported on 12/28/2017 10/11/17   Levert Feinstein, MD  Incontinence Supply Disposable (DEPEND EASY FIT UNDERGARMENTS) MISC 1 Units by Does not apply route 3 (three) times daily. 03/30/18   Geralyn Corwin Ratliff, DO  predniSONE (STERAPRED UNI-PAK 21 TAB) 10 MG (21) TBPK tablet Take by mouth daily. Take 6 tabs by mouth daily  for 1 days, then 5 tabs for 2 days, then 4 tabs for 2 days, then 3 tabs for 2 days, 2 tabs for 2 days, then 1 tab by mouth daily for 2 days Patient  not taking: Reported on 08/23/2018 02/05/18   Dietrich PatesKhatri, Hina, PA-C  senna-docusate (SENOKOT-S) 8.6-50 MG tablet Take 1 tablet by mouth daily. Patient not taking: Reported on 12/28/2017 11/15/17   Geralyn CorwinHoffman, Jessica Ratliff, DO  omeprazole (PRILOSEC OTC) 20 MG tablet Take 1 tablet (20 mg total) by mouth daily. 12/13/12 04/09/13  Reva BoresPratt, Tanya S, MD    Family History Family History  Problem Relation Age of Onset   Cancer Mother        lung   Hyperlipidemia Mother    Hypertension Father    Hyperlipidemia Father    Colon cancer Neg Hx    Colon polyps Neg Hx    Rectal cancer Neg Hx    Stomach cancer Neg Hx     Social History Social History   Tobacco Use   Smoking status: Former Smoker    Packs/day: 0.50    Types: Cigarettes    Last attempt to quit: 01/03/2018    Years  since quitting: 0.6   Smokeless tobacco: Never Used   Tobacco comment: 1 puff occassionally  Substance Use Topics   Alcohol use: Yes    Alcohol/week: 2.0 standard drinks    Types: 2 Cans of beer per week    Comment: 1 daily   Drug use: Yes    Types: Cocaine     Allergies   Codeine and Sulfa antibiotics   Review of Systems Review of Systems  Respiratory: Negative for shortness of breath.   Cardiovascular: Negative for chest pain.  Musculoskeletal: Negative for back pain.  All other systems reviewed and are negative.    Physical Exam Updated Vital Signs BP 93/61    Pulse 83    Resp 18    Ht 5' (1.524 m)    Wt 53.1 kg    SpO2 (!) 86%    BMI 22.85 kg/m   Physical Exam Vitals signs and nursing note reviewed.  Constitutional:      General: She is not in acute distress.    Appearance: Normal appearance. She is well-developed.  HENT:     Head: Normocephalic and atraumatic.     Right Ear: Hearing normal.     Left Ear: Hearing normal.     Nose: Nose normal.  Eyes:     Conjunctiva/sclera: Conjunctivae normal.     Pupils: Pupils are equal, round, and reactive to light.  Neck:     Musculoskeletal: Normal range of motion and neck supple.  Cardiovascular:     Rate and Rhythm: Regular rhythm.     Heart sounds: S1 normal and S2 normal. No murmur. No friction rub. No gallop.   Pulmonary:     Effort: Pulmonary effort is normal. No respiratory distress.     Breath sounds: Normal breath sounds.  Chest:     Chest wall: No tenderness.  Abdominal:     General: Bowel sounds are normal.     Palpations: Abdomen is soft.     Tenderness: There is no abdominal tenderness. There is no guarding or rebound. Negative signs include Murphy's sign and McBurney's sign.     Hernia: No hernia is present.  Musculoskeletal: Normal range of motion.  Skin:    General: Skin is warm and dry.     Findings: No rash.       Neurological:     Mental Status: She is alert and oriented to person,  place, and time.     GCS: GCS eye subscore is 4. GCS verbal subscore is 5. GCS motor subscore  is 6.     Cranial Nerves: No cranial nerve deficit.     Sensory: No sensory deficit.     Coordination: Coordination normal.  Psychiatric:        Speech: Speech normal.        Behavior: Behavior normal.        Thought Content: Thought content normal.      ED Treatments / Results  Labs (all labs ordered are listed, but only abnormal results are displayed) Labs Reviewed  CBC WITH DIFFERENTIAL/PLATELET - Abnormal; Notable for the following components:      Result Value   RBC 5.48 (*)    RDW 18.6 (*)    All other components within normal limits  COMPREHENSIVE METABOLIC PANEL - Abnormal; Notable for the following components:   CO2 18 (*)    Glucose, Bld 143 (*)    BUN 6 (*)    All other components within normal limits  ETHANOL - Abnormal; Notable for the following components:   Alcohol, Ethyl (B) 120 (*)    All other components within normal limits  AMMONIA - Abnormal; Notable for the following components:   Ammonia 37 (*)    All other components within normal limits  PROTIME-INR  RAPID URINE DRUG SCREEN, HOSP PERFORMED    EKG None  Radiology Ct Head Wo Contrast  Result Date: 08/23/2018 CLINICAL DATA:  Fall EXAM: CT HEAD WITHOUT CONTRAST CT CERVICAL SPINE WITHOUT CONTRAST TECHNIQUE: Multidetector CT imaging of the head and cervical spine was performed following the standard protocol without intravenous contrast. Multiplanar CT image reconstructions of the cervical spine were also generated. COMPARISON:  Head CT and cervical spine CT 07/06/2017 FINDINGS: CT HEAD FINDINGS Brain: There is no mass, hemorrhage or extra-axial collection. There is generalized atrophy without lobar predilection. The brain parenchyma is normal, without evidence of acute or chronic infarction. Vascular: No abnormal hyperdensity of the major intracranial arteries or dural venous sinuses. No intracranial  atherosclerosis. Skull: The visualized skull base, calvarium and extracranial soft tissues are normal. Sinuses/Orbits: Opacification of the left frontal sinus. The orbits are normal. CT CERVICAL SPINE FINDINGS Alignment: No static subluxation. Facets are aligned. Occipital condyles are normally positioned. Skull base and vertebrae: No acute fracture. Soft tissues and spinal canal: No prevertebral fluid or swelling. No visible canal hematoma. Disc levels: Multilevel facet hypertrophy. No bony spinal canal stenosis. Upper chest: No pneumothorax, pulmonary nodule or pleural effusion. Other: Normal visualized paraspinal cervical soft tissues. IMPRESSION: 1. No acute intracranial abnormality. 2. No fracture or static subluxation of the cervical spine. Electronically Signed   By: Deatra Robinson M.D.   On: 08/23/2018 01:55   Ct Cervical Spine Wo Contrast  Result Date: 08/23/2018 CLINICAL DATA:  Fall EXAM: CT HEAD WITHOUT CONTRAST CT CERVICAL SPINE WITHOUT CONTRAST TECHNIQUE: Multidetector CT imaging of the head and cervical spine was performed following the standard protocol without intravenous contrast. Multiplanar CT image reconstructions of the cervical spine were also generated. COMPARISON:  Head CT and cervical spine CT 07/06/2017 FINDINGS: CT HEAD FINDINGS Brain: There is no mass, hemorrhage or extra-axial collection. There is generalized atrophy without lobar predilection. The brain parenchyma is normal, without evidence of acute or chronic infarction. Vascular: No abnormal hyperdensity of the major intracranial arteries or dural venous sinuses. No intracranial atherosclerosis. Skull: The visualized skull base, calvarium and extracranial soft tissues are normal. Sinuses/Orbits: Opacification of the left frontal sinus. The orbits are normal. CT CERVICAL SPINE FINDINGS Alignment: No static subluxation. Facets are aligned. Occipital condyles are  normally positioned. Skull base and vertebrae: No acute fracture. Soft  tissues and spinal canal: No prevertebral fluid or swelling. No visible canal hematoma. Disc levels: Multilevel facet hypertrophy. No bony spinal canal stenosis. Upper chest: No pneumothorax, pulmonary nodule or pleural effusion. Other: Normal visualized paraspinal cervical soft tissues. IMPRESSION: 1. No acute intracranial abnormality. 2. No fracture or static subluxation of the cervical spine. Electronically Signed   By: Deatra Robinson M.D.   On: 08/23/2018 01:55    Procedures Procedures (including critical care time)  Medications Ordered in ED Medications - No data to display   Initial Impression / Assessment and Plan / ED Course  I have reviewed the triage vital signs and the nursing notes.  Pertinent labs & imaging results that were available during my care of the patient were reviewed by me and considered in my medical decision making (see chart for details).        She presents to the emergency department for evaluation after a fall.  Patient admits to smoking crack cocaine and drinking alcohol tonight.  She has had other falls recently.  She presents with an abrasion and contusion on her forehead that is not from the fall tonight.  She denies blood thinner use, but EMS report that she did have a bottle of Coumadin prescribed to her at the home.  Her INR, however is 1.1, she is not currently on blood thinners.  CT head and cervical spine were negative.  Remainder of blood work were unremarkable other than evidence of mild intoxication.  She will not require further hospitalization or treatment.  Final Clinical Impressions(s) / ED Diagnoses   Final diagnoses:  Alcoholic intoxication without complication (HCC)  Crack cocaine use    ED Discharge Orders    None       Ohanna Gassert, Canary Brim, MD 08/23/18 [id number]

## 2018-08-23 NOTE — ED Notes (Signed)
Pt returned from ct

## 2018-08-23 NOTE — ED Notes (Signed)
Low 02 sats  02 nasal cannula at 4

## 2018-08-23 NOTE — ED Notes (Signed)
Pt remains alert no distress 

## 2018-08-23 NOTE — ED Notes (Signed)
To ct

## 2018-09-18 ENCOUNTER — Other Ambulatory Visit: Payer: Self-pay | Admitting: Internal Medicine

## 2018-10-04 ENCOUNTER — Other Ambulatory Visit: Payer: Self-pay | Admitting: Internal Medicine

## 2018-10-08 ENCOUNTER — Encounter: Payer: Self-pay | Admitting: *Deleted

## 2018-10-29 ENCOUNTER — Emergency Department (HOSPITAL_COMMUNITY): Payer: Medicaid Other

## 2018-10-29 ENCOUNTER — Encounter (HOSPITAL_COMMUNITY): Payer: Self-pay | Admitting: Emergency Medicine

## 2018-10-29 ENCOUNTER — Emergency Department (HOSPITAL_COMMUNITY)
Admission: EM | Admit: 2018-10-29 | Discharge: 2018-10-29 | Disposition: A | Discharge status: Home / Self Care | Payer: Medicaid Other | Attending: Emergency Medicine | Admitting: Emergency Medicine

## 2018-10-29 ENCOUNTER — Other Ambulatory Visit: Payer: Self-pay

## 2018-10-29 DIAGNOSIS — Z79899 Other long term (current) drug therapy: Secondary | ICD-10-CM | POA: Diagnosis not present

## 2018-10-29 DIAGNOSIS — R21 Rash and other nonspecific skin eruption: Secondary | ICD-10-CM | POA: Diagnosis not present

## 2018-10-29 DIAGNOSIS — Z87891 Personal history of nicotine dependence: Secondary | ICD-10-CM | POA: Insufficient documentation

## 2018-10-29 DIAGNOSIS — I10 Essential (primary) hypertension: Secondary | ICD-10-CM | POA: Insufficient documentation

## 2018-10-29 DIAGNOSIS — R079 Chest pain, unspecified: Secondary | ICD-10-CM | POA: Diagnosis not present

## 2018-10-29 DIAGNOSIS — R0781 Pleurodynia: Secondary | ICD-10-CM | POA: Diagnosis not present

## 2018-10-29 MED ORDER — ACETAMINOPHEN 325 MG PO TABS
650.0000 mg | ORAL_TABLET | Freq: Once | ORAL | Status: AC
  Administered 2018-10-29: 650 mg via ORAL
  Filled 2018-10-29: qty 2

## 2018-10-29 NOTE — ED Notes (Signed)
Patient reports she has an incentive spirometer at home and she will use this for deep breathing.

## 2018-10-29 NOTE — ED Notes (Signed)
Patient transported to X-ray 

## 2018-10-29 NOTE — Discharge Instructions (Addendum)
Please read attached information. If you experience any new or worsening signs or symptoms please return to the emergency room for evaluation. Please follow-up with your primary care provider or specialist as discussed.  °

## 2018-10-29 NOTE — ED Triage Notes (Signed)
Pt reports left sided lower rib cage pain. Pt reports started last night. Pt has point tenderness to the area. Pt denies any cardiac symptoms.

## 2018-10-29 NOTE — ED Provider Notes (Signed)
[facility] EMERGENCY DEPARTMENT Provider Note   CSN: [id number] Arrival date & time: 10/29/18  1039    History   Chief Complaint Chief Complaint  Patient presents with  . Rib Cage Pain    HPI Jody Mcclure is a 63 y.o. female.     HPI   63 year old female presents today with complaints of left-sided rib pain.  Patient notes yesterday she developed soreness, woke up this morning with more pain.  She notes this originally started in the left anterior ribs but has wraparound to the lateral aspect.  She notes pain with movement, pain with palpation, pain with deep inspiration.  She notes she gets short of breath when she takes a deep breath secondary to pain but no shortness of breath at rest.  She denies any fever productive cough.  She denies any lower extremity swelling or edema, history of PE or DVT or any significant risk factors.  She denies any recent falls or trauma.  She denies any cardiac symptoms.   Past Medical History:  Diagnosis Date  . Anxiety   . Depression   . Hyperlipidemia   . Hypertension     Patient Active Problem List   Diagnosis Date Noted  . Stress incontinence 04/01/2018  . Dyspnea on exertion 03/05/2018  . Mild dementia ([facility]) 11/15/2017  . Constipation 11/15/2017  . Encounter for screening mammogram for breast cancer 06/03/2016  . Lumbar radiculopathy, chronic 06/03/2016  . Generalized anxiety disorder 06/03/2016  . Healthcare maintenance 06/03/2016  . GERD (gastroesophageal reflux disease) 12/13/2012  . Depression 12/13/2012  . Dysphagia 04/18/2012  . Hypertension 08/15/2011  . Rash and nonspecific skin eruption 08/15/2011    Past Surgical History:  Procedure Laterality Date  . ABDOMINAL HYSTERECTOMY    . TONSILLECTOMY       OB History    Gravida  2   Para      Term      Preterm      AB      Living  2     SAB      TAB      Ectopic      Multiple      Live Births               Home Medications     Prior to Admission medications   Medication Sig Start Date End Date Taking? Authorizing Provider  amLODipine (NORVASC) 5 MG tablet TAKE 1 TABLET BY MOUTH EVERY DAY Patient taking differently: Take 5 mg by mouth daily.  08/20/18   Hoffman, Janett Billow Ratliff, DO  betamethasone valerate ointment (VALISONE) 0.1 % APPLY 1 APPLICATION TOPICALLY 2 (TWO) TIMES DAILY. 10/04/18   Hoffman, Janett Billow Ratliff, DO  cyclobenzaprine (FLEXERIL) 5 MG tablet TAKE 1 TABLET BY MOUTH THREE TIMES A DAY AS NEEDED FOR MUSCLE SPASMS Patient not taking: Reported on 12/28/2017 10/11/17   Annia Belt, MD  hydrOXYzine (ATARAX/VISTARIL) 10 MG tablet TAKE 1 TABLET BY MOUTH THREE TIMES A DAY AS NEEDED Patient not taking: Reported on 12/28/2017 10/11/17   Annia Belt, MD  Incontinence Supply Disposable (DEPEND EASY FIT UNDERGARMENTS) MISC 1 Units by Does not apply route 3 (three) times daily. 03/30/18   Kalman Shan Ratliff, DO  predniSONE (STERAPRED UNI-PAK 21 TAB) 10 MG (21) TBPK tablet Take by mouth daily. Take 6 tabs by mouth daily  for 1 days, then 5 tabs for 2 days, then 4 tabs for 2 days, then 3 tabs for 2 days, 2  tabs for 2 days, then 1 tab by mouth daily for 2 days Patient not taking: Reported on 08/23/2018 02/05/18   Dietrich PatesKhatri, Hina, PA-C  senna-docusate (SENOKOT-S) 8.6-50 MG tablet Take 1 tablet by mouth daily. Patient not taking: Reported on 12/28/2017 11/15/17   Geralyn CorwinHoffman, Jessica Ratliff, DO  sertraline (ZOLOFT) 50 MG tablet TAKE 3 TABLETS BY MOUTH EVERY DAY 09/18/18   Tyson AliasVincent, Duncan Thomas, MD  Tiotropium Bromide-Olodaterol (STIOLTO RESPIMAT) 2.5-2.5 MCG/ACT AERS Inhale 2 puffs into the lungs daily. 08/15/18   Geralyn CorwinHoffman, Jessica Ratliff, DO  omeprazole (PRILOSEC OTC) 20 MG tablet Take 1 tablet (20 mg total) by mouth daily. 12/13/12 04/09/13  Reva BoresPratt, Tanya S, MD    Family History Family History  Problem Relation Age of Onset  . Cancer Mother        lung  . Hyperlipidemia Mother   . Hypertension Father   .  Hyperlipidemia Father   . Colon cancer Neg Hx   . Colon polyps Neg Hx   . Rectal cancer Neg Hx   . Stomach cancer Neg Hx     Social History Social History   Tobacco Use  . Smoking status: Former Smoker    Packs/day: 0.50    Types: Cigarettes    Quit date: 01/03/2018    Years since quitting: 0.8  . Smokeless tobacco: Never Used  . Tobacco comment: 1 puff occassionally  Substance Use Topics  . Alcohol use: Yes    Alcohol/week: 2.0 standard drinks    Types: 2 Cans of beer per week    Comment: 1 daily  . Drug use: Yes    Types: Cocaine     Allergies   Codeine and Sulfa antibiotics   Review of Systems Review of Systems  All other systems reviewed and are negative.    Physical Exam Updated Vital Signs BP 130/85 (BP Location: Right Arm)   Pulse 82   Temp 98.2 F (36.8 C) (Oral)   Resp 16   Ht 5' (1.524 m)   Wt 49.9 kg   LMP  (Exact Date)   SpO2 97%   BMI 21.48 kg/m   Physical Exam Vitals signs and nursing note reviewed.  Constitutional:      Appearance: She is well-developed.  HENT:     Head: Normocephalic and atraumatic.  Eyes:     General: No scleral icterus.       Right eye: No discharge.        Left eye: No discharge.     Conjunctiva/sclera: Conjunctivae normal.     Pupils: Pupils are equal, round, and reactive to light.  Neck:     Musculoskeletal: Normal range of motion.     Vascular: No JVD.     Trachea: No tracheal deviation.  Pulmonary:     Effort: Pulmonary effort is normal.     Breath sounds: No stridor.  Musculoskeletal:     Comments: Tenderness palpation of left anterior and lateral ribs with minor amount of bruising, no crepitus, lung expansion normal, lung sounds clear-no rashes  Skin:    Comments: Excoriated rash noted to the right lateral thoracic region, posterior neck-no vesicles or discharge  Neurological:     Mental Status: She is alert and oriented to person, place, and time.     Coordination: Coordination normal.   Psychiatric:        Behavior: Behavior normal.        Thought Content: Thought content normal.        Judgment: Judgment normal.  ED Treatments / Results  Labs (all labs ordered are listed, but only abnormal results are displayed) Labs Reviewed - No data to display  EKG None  Radiology Dg Chest 2 View  Result Date: 10/29/2018 CLINICAL DATA:  Left lateral chest pain.  No known injury. EXAM: CHEST - 2 VIEW COMPARISON:  02/05/2018 FINDINGS: Vague density noted at the left lung base overlying the left 10th rib. It is unclear if this could represent healing 10th rib fracture or left base atelectasis. Lungs appear clear on the lateral view. Heart is normal size. No effusions. No acute bony abnormality. IMPRESSION: Vague density projects over the left lung base and the posterior left 10th rib, question healing fracture. This could be further evaluated with chest CT if felt clinically indicated. Electronically Signed   By: Charlett NoseKevin  Dover M.D.   On: 10/29/2018 11:14    Procedures Procedures (including critical care time)  Medications Ordered in ED Medications  acetaminophen (TYLENOL) tablet 650 mg (650 mg Oral Given 10/29/18 1206)     Initial Impression / Assessment and Plan / ED Course  I have reviewed the triage vital signs and the nursing notes.  Pertinent labs & imaging results that were available during my care of the patient were reviewed by me and considered in my medical decision making (see chart for details).        Labs:   Imaging: DG chest 2 view  Consults:  Therapeutics:  Discharge Meds:   Assessment/Plan: 63 year old female presents today with likely musculoskeletal rib pain.  Low suspicion of ACS, dissection, PE, infection or any other life-threatening etiology.  No signs of shingles.  Her chest x-ray shows small abnormality questioning old rib fracture.  She does have a history of falls.  Results will be printed off patient follow-up as an outpatient with  her primary care for review of the images.  She is given strict return precautions, she verbalized understanding and agreement to this plan.   Final Clinical Impressions(s) / ED Diagnoses   Final diagnoses:  Rib pain    ED Discharge Orders    None       Eyvonne MechanicHedges, Kailani Brass, PA-C 10/29/18 1542    Pricilla LovelessGoldston, Scott, MD 10/31/18 [phone]

## 2018-11-21 ENCOUNTER — Other Ambulatory Visit: Payer: Self-pay

## 2018-11-21 ENCOUNTER — Observation Stay (HOSPITAL_COMMUNITY): Payer: Medicaid Other

## 2018-11-21 ENCOUNTER — Encounter (HOSPITAL_COMMUNITY): Payer: Self-pay

## 2018-11-21 ENCOUNTER — Emergency Department (HOSPITAL_COMMUNITY): Payer: Medicaid Other

## 2018-11-21 ENCOUNTER — Inpatient Hospital Stay (HOSPITAL_COMMUNITY)
Admission: EM | Admit: 2018-11-21 | Discharge: 2018-11-25 | DRG: 872 | Disposition: A | Discharge status: Home / Self Care | Payer: Medicaid Other | Attending: Internal Medicine | Admitting: Internal Medicine

## 2018-11-21 DIAGNOSIS — Z885 Allergy status to narcotic agent status: Secondary | ICD-10-CM

## 2018-11-21 DIAGNOSIS — R531 Weakness: Secondary | ICD-10-CM

## 2018-11-21 DIAGNOSIS — N838 Other noninflammatory disorders of ovary, fallopian tube and broad ligament: Secondary | ICD-10-CM | POA: Diagnosis present

## 2018-11-21 DIAGNOSIS — Z20828 Contact with and (suspected) exposure to other viral communicable diseases: Secondary | ICD-10-CM | POA: Diagnosis not present

## 2018-11-21 DIAGNOSIS — K8020 Calculus of gallbladder without cholecystitis without obstruction: Secondary | ICD-10-CM | POA: Diagnosis not present

## 2018-11-21 DIAGNOSIS — A419 Sepsis, unspecified organism: Secondary | ICD-10-CM | POA: Diagnosis present

## 2018-11-21 DIAGNOSIS — E876 Hypokalemia: Secondary | ICD-10-CM

## 2018-11-21 DIAGNOSIS — Z87891 Personal history of nicotine dependence: Secondary | ICD-10-CM | POA: Diagnosis not present

## 2018-11-21 DIAGNOSIS — M4854XA Collapsed vertebra, not elsewhere classified, thoracic region, initial encounter for fracture: Secondary | ICD-10-CM | POA: Diagnosis not present

## 2018-11-21 DIAGNOSIS — Z882 Allergy status to sulfonamides status: Secondary | ICD-10-CM | POA: Diagnosis not present

## 2018-11-21 DIAGNOSIS — D649 Anemia, unspecified: Secondary | ICD-10-CM | POA: Diagnosis not present

## 2018-11-21 DIAGNOSIS — Z79899 Other long term (current) drug therapy: Secondary | ICD-10-CM

## 2018-11-21 DIAGNOSIS — R1110 Vomiting, unspecified: Secondary | ICD-10-CM | POA: Diagnosis not present

## 2018-11-21 DIAGNOSIS — I4581 Long QT syndrome: Secondary | ICD-10-CM | POA: Diagnosis not present

## 2018-11-21 DIAGNOSIS — R509 Fever, unspecified: Secondary | ICD-10-CM | POA: Diagnosis not present

## 2018-11-21 DIAGNOSIS — F411 Generalized anxiety disorder: Secondary | ICD-10-CM | POA: Diagnosis not present

## 2018-11-21 DIAGNOSIS — R61 Generalized hyperhidrosis: Secondary | ICD-10-CM | POA: Diagnosis not present

## 2018-11-21 DIAGNOSIS — R1111 Vomiting without nausea: Secondary | ICD-10-CM | POA: Diagnosis not present

## 2018-11-21 DIAGNOSIS — I10 Essential (primary) hypertension: Secondary | ICD-10-CM | POA: Diagnosis present

## 2018-11-21 DIAGNOSIS — F329 Major depressive disorder, single episode, unspecified: Secondary | ICD-10-CM | POA: Diagnosis present

## 2018-11-21 DIAGNOSIS — F0390 Unspecified dementia without behavioral disturbance: Secondary | ICD-10-CM | POA: Diagnosis present

## 2018-11-21 DIAGNOSIS — J449 Chronic obstructive pulmonary disease, unspecified: Secondary | ICD-10-CM | POA: Diagnosis present

## 2018-11-21 DIAGNOSIS — K449 Diaphragmatic hernia without obstruction or gangrene: Secondary | ICD-10-CM | POA: Diagnosis not present

## 2018-11-21 DIAGNOSIS — R112 Nausea with vomiting, unspecified: Secondary | ICD-10-CM | POA: Diagnosis not present

## 2018-11-21 DIAGNOSIS — R110 Nausea: Secondary | ICD-10-CM | POA: Diagnosis not present

## 2018-11-21 DIAGNOSIS — A4151 Sepsis due to Escherichia coli [E. coli]: Secondary | ICD-10-CM | POA: Diagnosis not present

## 2018-11-21 DIAGNOSIS — N136 Pyonephrosis: Secondary | ICD-10-CM | POA: Diagnosis present

## 2018-11-21 DIAGNOSIS — R0600 Dyspnea, unspecified: Secondary | ICD-10-CM | POA: Diagnosis not present

## 2018-11-21 LAB — SARS CORONAVIRUS 2 BY RT PCR (HOSPITAL ORDER, PERFORMED IN ~~LOC~~ HOSPITAL LAB): SARS Coronavirus 2: NEGATIVE

## 2018-11-21 LAB — COMPREHENSIVE METABOLIC PANEL
ALT: 51 U/L — ABNORMAL HIGH (ref 0–44)
AST: 51 U/L — ABNORMAL HIGH (ref 15–41)
Albumin: 3.2 g/dL — ABNORMAL LOW (ref 3.5–5.0)
Alkaline Phosphatase: 107 U/L (ref 38–126)
Anion gap: 12 (ref 5–15)
BUN: 10 mg/dL (ref 8–23)
CO2: 22 mmol/L (ref 22–32)
Calcium: 8.3 mg/dL — ABNORMAL LOW (ref 8.9–10.3)
Chloride: 101 mmol/L (ref 98–111)
Creatinine, Ser: 0.94 mg/dL (ref 0.44–1.00)
GFR calc Af Amer: >60 mL/min (ref >60)
GFR calc non Af Amer: >60 mL/min (ref >60)
Glucose, Bld: 134 mg/dL — ABNORMAL HIGH (ref 70–99)
Potassium: 2.7 mmol/L — CL (ref 3.5–5.1)
Sodium: 135 mmol/L (ref 135–145)
Total Bilirubin: 1.3 mg/dL — ABNORMAL HIGH (ref 0.3–1.2)
Total Protein: 7.7 g/dL (ref 6.5–8.1)

## 2018-11-21 LAB — CBC
HCT: 29.7 % — ABNORMAL LOW (ref 36.0–46.0)
Hemoglobin: 9.4 g/dL — ABNORMAL LOW (ref 12.0–15.0)
MCH: 29.4 pg (ref 26.0–34.0)
MCHC: 31.6 g/dL (ref 30.0–36.0)
MCV: 92.8 fL (ref 80.0–100.0)
Platelets: 226 10*3/uL (ref 150–400)
RBC: 3.2 MIL/uL — ABNORMAL LOW (ref 3.87–5.11)
RDW: 14.6 % (ref 11.5–15.5)
WBC: 11.8 10*3/uL — ABNORMAL HIGH (ref 4.0–10.5)
nRBC: 0 % (ref 0.0–0.2)

## 2018-11-21 LAB — PROCALCITONIN: Procalcitonin: 2.34 ng/mL

## 2018-11-21 LAB — CBC WITH DIFFERENTIAL/PLATELET
Abs Immature Granulocytes: 0.72 10*3/uL — ABNORMAL HIGH (ref 0.00–0.07)
Basophils Absolute: 0.1 10*3/uL (ref 0.0–0.1)
Basophils Relative: 0 %
Eosinophils Absolute: 0 10*3/uL (ref 0.0–0.5)
Eosinophils Relative: 0 %
HCT: 35.9 % — ABNORMAL LOW (ref 36.0–46.0)
Hemoglobin: 11.9 g/dL — ABNORMAL LOW (ref 12.0–15.0)
Immature Granulocytes: 5 %
Lymphocytes Relative: 3 %
Lymphs Abs: 0.4 10*3/uL — ABNORMAL LOW (ref 0.7–4.0)
MCH: 30.1 pg (ref 26.0–34.0)
MCHC: 33.1 g/dL (ref 30.0–36.0)
MCV: 90.9 fL (ref 80.0–100.0)
Monocytes Absolute: 2 10*3/uL — ABNORMAL HIGH (ref 0.1–1.0)
Monocytes Relative: 13 %
Neutro Abs: 12.4 10*3/uL — ABNORMAL HIGH (ref 1.7–7.7)
Neutrophils Relative %: 79 %
Platelets: 278 10*3/uL (ref 150–400)
RBC: 3.95 MIL/uL (ref 3.87–5.11)
RDW: 14.5 % (ref 11.5–15.5)
WBC: 15.6 10*3/uL — ABNORMAL HIGH (ref 4.0–10.5)
nRBC: 0 % (ref 0.0–0.2)

## 2018-11-21 LAB — FERRITIN: Ferritin: 277 ng/mL (ref 11–307)

## 2018-11-21 LAB — CREATININE, SERUM
Creatinine, Ser: 0.82 mg/dL (ref 0.44–1.00)
GFR calc Af Amer: >60 mL/min (ref >60)
GFR calc non Af Amer: >60 mL/min (ref >60)

## 2018-11-21 LAB — TRIGLYCERIDES: Triglycerides: 150 mg/dL — ABNORMAL HIGH (ref <150)

## 2018-11-21 LAB — C-REACTIVE PROTEIN: CRP: 20.7 mg/dL — ABNORMAL HIGH (ref <1.0)

## 2018-11-21 LAB — D-DIMER, QUANTITATIVE: D-Dimer, Quant: 2.67 ug/mL-FEU — ABNORMAL HIGH (ref 0.00–0.50)

## 2018-11-21 LAB — LACTIC ACID, PLASMA: Lactic Acid, Venous: 0.8 mmol/L (ref 0.5–1.9)

## 2018-11-21 LAB — MAGNESIUM: Magnesium: 1.7 mg/dL (ref 1.7–2.4)

## 2018-11-21 LAB — FIBRINOGEN: Fibrinogen: >800 mg/dL — ABNORMAL HIGH (ref 210–475)

## 2018-11-21 LAB — LACTATE DEHYDROGENASE: LDH: 137 U/L (ref 98–192)

## 2018-11-21 MED ORDER — SODIUM CHLORIDE 0.9 % IV BOLUS
1000.0000 mL | Freq: Once | INTRAVENOUS | Status: AC
  Administered 2018-11-21: 1000 mL via INTRAVENOUS

## 2018-11-21 MED ORDER — VANCOMYCIN HCL 10 G IV SOLR
1250.0000 mg | Freq: Once | INTRAVENOUS | Status: AC
  Administered 2018-11-21: 1250 mg via INTRAVENOUS
  Filled 2018-11-21: qty 1250

## 2018-11-21 MED ORDER — VANCOMYCIN HCL IN DEXTROSE 750-5 MG/150ML-% IV SOLN: 750.0000 mg | INTRAVENOUS | Status: DC

## 2018-11-21 MED ORDER — SODIUM CHLORIDE 0.9 % IV SOLN
2.0000 g | Freq: Once | INTRAVENOUS | Status: AC
  Administered 2018-11-21: 2 g via INTRAVENOUS
  Filled 2018-11-21: qty 2

## 2018-11-21 MED ORDER — POTASSIUM CHLORIDE 10 MEQ/100ML IV SOLN
10.0000 meq | INTRAVENOUS | Status: AC
  Administered 2018-11-21 (×2): 10 meq via INTRAVENOUS
  Filled 2018-11-21 (×2): qty 100

## 2018-11-21 MED ORDER — SODIUM CHLORIDE 0.9 % IV BOLUS
500.0000 mL | Freq: Once | INTRAVENOUS | Status: AC
  Administered 2018-11-21: 500 mL via INTRAVENOUS

## 2018-11-21 MED ORDER — POTASSIUM CHLORIDE CRYS ER 20 MEQ PO TBCR
40.0000 meq | EXTENDED_RELEASE_TABLET | Freq: Once | ORAL | Status: AC
  Administered 2018-11-21: 40 meq via ORAL
  Filled 2018-11-21: qty 2

## 2018-11-21 MED ORDER — SODIUM CHLORIDE (PF) 0.9 % IJ SOLN
INTRAMUSCULAR | Status: AC
  Filled 2018-11-21: qty 50

## 2018-11-21 MED ORDER — ACETAMINOPHEN 650 MG RE SUPP: 650.0000 mg | Freq: Four times a day (QID) | RECTAL | Status: DC | PRN

## 2018-11-21 MED ORDER — TRAZODONE HCL 50 MG PO TABS
25.0000 mg | ORAL_TABLET | Freq: Once | ORAL | Status: AC
  Administered 2018-11-21: 25 mg via ORAL
  Filled 2018-11-21: qty 1

## 2018-11-21 MED ORDER — LACTATED RINGERS IV BOLUS
500.0000 mL | Freq: Once | INTRAVENOUS | Status: AC
  Administered 2018-11-21: 500 mL via INTRAVENOUS

## 2018-11-21 MED ORDER — UMECLIDINIUM BROMIDE 62.5 MCG/INH IN AEPB
1.0000 | INHALATION_SPRAY | Freq: Every day | RESPIRATORY_TRACT | Status: DC
  Administered 2018-11-21 – 2018-11-25 (×5): 1 via RESPIRATORY_TRACT
  Filled 2018-11-21: qty 7

## 2018-11-21 MED ORDER — SODIUM CHLORIDE 0.9 % IV SOLN
2.0000 g | Freq: Two times a day (BID) | INTRAVENOUS | Status: DC
  Administered 2018-11-22: 2 g via INTRAVENOUS
  Filled 2018-11-21: qty 2

## 2018-11-21 MED ORDER — ENOXAPARIN SODIUM 40 MG/0.4ML ~~LOC~~ SOLN
40.0000 mg | SUBCUTANEOUS | Status: DC
  Administered 2018-11-21 – 2018-11-24 (×4): 40 mg via SUBCUTANEOUS
  Filled 2018-11-21 (×4): qty 0.4

## 2018-11-21 MED ORDER — IOHEXOL 350 MG/ML SOLN
100.0000 mL | Freq: Once | INTRAVENOUS | Status: AC | PRN
  Administered 2018-11-21: 100 mL via INTRAVENOUS

## 2018-11-21 MED ORDER — ACETAMINOPHEN 325 MG PO TABS
650.0000 mg | ORAL_TABLET | Freq: Four times a day (QID) | ORAL | Status: DC | PRN
  Administered 2018-11-21 – 2018-11-22 (×2): 650 mg via ORAL
  Filled 2018-11-21 (×2): qty 2

## 2018-11-21 MED ORDER — SODIUM CHLORIDE 0.9 % IV SOLN
1000.0000 mL | INTRAVENOUS | Status: DC
  Administered 2018-11-21 – 2018-11-22 (×2): 1000 mL via INTRAVENOUS

## 2018-11-21 MED ORDER — ACETAMINOPHEN 325 MG PO TABS
650.0000 mg | ORAL_TABLET | Freq: Once | ORAL | Status: AC
  Administered 2018-11-21: 650 mg via ORAL
  Filled 2018-11-21: qty 2

## 2018-11-21 MED ORDER — POLYETHYLENE GLYCOL 3350 17 G PO PACK
17.0000 g | PACK | Freq: Every day | ORAL | Status: DC
  Administered 2018-11-21 – 2018-11-25 (×5): 17 g via ORAL
  Filled 2018-11-21 (×5): qty 1

## 2018-11-21 MED ORDER — SERTRALINE HCL 50 MG PO TABS
150.0000 mg | ORAL_TABLET | Freq: Every day | ORAL | Status: DC
  Administered 2018-11-21 – 2018-11-25 (×5): 150 mg via ORAL
  Filled 2018-11-21 (×5): qty 1

## 2018-11-21 MED ORDER — ARFORMOTEROL TARTRATE 15 MCG/2ML IN NEBU
15.0000 ug | INHALATION_SOLUTION | Freq: Two times a day (BID) | RESPIRATORY_TRACT | Status: DC
  Administered 2018-11-21 – 2018-11-25 (×8): 15 ug via RESPIRATORY_TRACT
  Filled 2018-11-21 (×8): qty 2

## 2018-11-21 MED ORDER — OXYCODONE HCL 5 MG PO TABS
5.0000 mg | ORAL_TABLET | ORAL | Status: DC | PRN
  Administered 2018-11-22 – 2018-11-25 (×10): 5 mg via ORAL
  Filled 2018-11-21 (×10): qty 1

## 2018-11-21 MED ORDER — TRIAMCINOLONE ACETONIDE 0.1 % EX OINT
TOPICAL_OINTMENT | Freq: Two times a day (BID) | CUTANEOUS | Status: DC
  Administered 2018-11-21 – 2018-11-24 (×5): via TOPICAL
  Administered 2018-11-25: 1 via TOPICAL
  Filled 2018-11-21: qty 80

## 2018-11-21 NOTE — ED Notes (Signed)
ED TO INPATIENT HANDOFF REPORT  ED Nurse Name and Phone #: [phone]/ Celeste  S Name/Age/Gender Jody Mcclure 63 y.o. female Room/Bed: WA19/WA19  Code Status   Code Status: Full Code  Home/SNF/Other Home Patient oriented to: self, place, time and situation Is this baseline? Yes   Triage Complete: Triage complete  Chief Complaint weakness and emesis  Triage Note EMS reports from home, general malaise and, shakes and chills x 1 week. 1 emesis this morning.  BP 110/65 HR 87 RR 22 Sp02 98 RA CBG 182 Temp 99.7  4mg  Zofran enroute  18 L forearm   Allergies Allergies  Allergen Reactions  . Codeine Nausea And Vomiting  . Sulfa Antibiotics Rash    Level of Care/Admitting Diagnosis ED Disposition    ED Disposition Condition Comment   Admit  Hospital Area: [facility] [[id number]]  Level of Care: Telemetry [5]  Admit to tele based on following criteria: Other see comments  Comments: sepsis  Covid Evaluation: Person Under Investigation (PUI)  Diagnosis: Sepsis [facility]) [[id number]]  Admitting Physician: Zigmund Daniel [phone]  Attending Physician: Shaune Spittle, A CALDWELL [phone]  PT Class (Do Not Modify): Observation [104]  PT Acc Code (Do Not Modify): Observation [10022]       B Medical/Surgery History Past Medical History:  Diagnosis Date  . Anxiety   . Depression   . Hyperlipidemia   . Hypertension    Past Surgical History:  Procedure Laterality Date  . ABDOMINAL HYSTERECTOMY    . TONSILLECTOMY       A IV Location/Drains/Wounds Patient Lines/Drains/Airways Status   Active Line/Drains/Airways    Name:   Placement date:   Placement time:   Site:   Days:   Peripheral IV 11/21/18 Anterior;Left Forearm   11/21/18    0727    Forearm   less than 1   Peripheral IV 11/21/18 Left Antecubital   11/21/18    0844    Antecubital   less than 1   Wound / Incision (Open or Dehisced) 10/07/14 Laceration Head Left approx 1 in lac to left of   left eyebrow/forehead, hemastatic   10/07/14    0817    Head   1506          Intake/Output Last 24 hours  Intake/Output Summary (Last 24 hours) at 11/21/2018 1733 Last data filed at 11/21/2018 1733 Gross per 24 hour  Intake 2700 ml  Output -  Net 2700 ml    Labs/Imaging Results for orders placed or performed during the hospital encounter of 11/21/18 (from the past 48 hour(s))  SARS Coronavirus 2 [facility] order, Performed in [facility] hospital lab) Nasopharyngeal Nasopharyngeal Swab     Status: None   Collection Time: 11/21/18  8:09 AM   Specimen: Nasopharyngeal Swab  Result Value Ref Range   SARS Coronavirus 2 NEGATIVE NEGATIVE    Comment: (NOTE) If result is NEGATIVE SARS-CoV-2 target nucleic acids are NOT DETECTED. The SARS-CoV-2 RNA is generally detectable in upper and lower  respiratory specimens during the acute phase of infection. The lowest  concentration of SARS-CoV-2 viral copies this assay can detect is 250  copies / mL. A negative result does not preclude SARS-CoV-2 infection  and should not be used as the sole basis for treatment or other  patient management decisions.  A negative result may occur with  improper specimen collection / handling, submission of specimen other  than nasopharyngeal swab, presence of viral mutation(s) within the  areas targeted by  this assay, and inadequate number of viral copies  (<250 copies / mL). A negative result must be combined with clinical  observations, patient history, and epidemiological information. If result is POSITIVE SARS-CoV-2 target nucleic acids are DETECTED. The SARS-CoV-2 RNA is generally detectable in upper and lower  respiratory specimens dur ing the acute phase of infection.  Positive  results are indicative of active infection with SARS-CoV-2.  Clinical  correlation with patient history and other diagnostic information is  necessary to determine patient infection status.  Positive results do  not rule out  bacterial infection or co-infection with other viruses. If result is PRESUMPTIVE POSTIVE SARS-CoV-2 nucleic acids MAY BE PRESENT.   A presumptive positive result was obtained on the submitted specimen  and confirmed on repeat testing.  While 2019 novel coronavirus  (SARS-CoV-2) nucleic acids may be present in the submitted sample  additional confirmatory testing may be necessary for epidemiological  and / or clinical management purposes  to differentiate between  SARS-CoV-2 and other Sarbecovirus currently known to infect humans.  If clinically indicated additional testing with an alternate test  methodology [phone]([id number]) is advised. The SARS-CoV-2 RNA is generally  detectable in upper and lower respiratory sp ecimens during the acute  phase of infection. The expected result is Negative. Fact Sheet for Patients:  BoilerBrush.com.cyhttps://www.fda.gov/media/136312/download Fact Sheet for Healthcare Providers: https://pope.com/https://www.fda.gov/media/136313/download This test is not yet approved or cleared by the [place] FDA and has been authorized for detection and/or diagnosis of SARS-CoV-2 by FDA under an Emergency Use Authorization (EUA).  This EUA will remain in effect (meaning this test can be used) for the duration of the COVID-19 declaration under Section 564(b)(1) of the Act, 21 U.S.C. section 360bbb-3(b)(1), unless the authorization is terminated or revoked sooner. Performed at [facility], [address] [place], [place] [address]   Lactic acid, plasma     Status: None   Collection Time: 11/21/18  8:38 AM  Result Value Ref Range   Lactic Acid, Venous 0.8 0.5 - 1.9 mmol/L    Comment: Performed at [facility], [address] [place], [place] [address]  CBC WITH DIFFERENTIAL     Status: Abnormal   Collection Time: 11/21/18  8:38 AM  Result Value Ref Range   WBC 15.6 (H) 4.0 - 10.5 K/uL   RBC 3.95 3.87 - 5.11 MIL/uL   Hemoglobin 11.9 (L) 12.0 - 15.0 g/dL   HCT  47.835.9 (L) 29.536.0 - 46.0 %   MCV 90.9 80.0 - 100.0 fL   MCH 30.1 26.0 - 34.0 pg   MCHC 33.1 30.0 - 36.0 g/dL   RDW 62.114.5 30.811.5 - 65.715.5 %   Platelets 278 150 - 400 K/uL   nRBC 0.0 0.0 - 0.2 %   Neutrophils Relative % 79 %   Neutro Abs 12.4 (H) 1.7 - 7.7 K/uL   Lymphocytes Relative 3 %   Lymphs Abs 0.4 (L) 0.7 - 4.0 K/uL   Monocytes Relative 13 %   Monocytes Absolute 2.0 (H) 0.1 - 1.0 K/uL   Eosinophils Relative 0 %   Eosinophils Absolute 0.0 0.0 - 0.5 K/uL   Basophils Relative 0 %   Basophils Absolute 0.1 0.0 - 0.1 K/uL   Immature Granulocytes 5 %   Abs Immature Granulocytes 0.72 (H) 0.00 - 0.07 K/uL    Comment: Performed at [facility], [address] [place], [place] [address]  Comprehensive metabolic panel     Status: Abnormal   Collection Time: 11/21/18  8:38 AM  Result Value Ref Range   Sodium 135 135 - 145 mmol/L   Potassium 2.7 (LL) 3.5 - 5.1 mmol/L    Comment: CRITICAL RESULT CALLED TO, READ BACK BY AND VERIFIED WITH: M.BOWEN RN AT 0945 ON 11/21/2018 BY S.VANHOORNE    Chloride 101 98 - 111 mmol/L   CO2 22 22 - 32 mmol/L   Glucose, Bld 134 (H) 70 - 99 mg/dL   BUN 10 8 - 23 mg/dL   Creatinine, Ser 6.96 0.44 - 1.00 mg/dL   Calcium 8.3 (L) 8.9 - 10.3 mg/dL   Total Protein 7.7 6.5 - 8.1 g/dL   Albumin 3.2 (L) 3.5 - 5.0 g/dL   AST 51 (H) 15 - 41 U/L   ALT 51 (H) 0 - 44 U/L   Alkaline Phosphatase 107 38 - 126 U/L   Total Bilirubin 1.3 (H) 0.3 - 1.2 mg/dL   GFR calc non Af Amer >60 >60 mL/min   GFR calc Af Amer >60 >60 mL/min   Anion gap 12 5 - 15    Comment: Performed at [facility], [address], [place], [place] [address]  D-dimer, quantitative     Status: Abnormal   Collection Time: 11/21/18  8:38 AM  Result Value Ref Range   D-Dimer, Quant 2.67 (H) 0.00 - 0.50 ug/mL-FEU    Comment: (NOTE) At the manufacturer cut-off of 0.50 ug/mL FEU, this assay has been documented to exclude PE with a sensitivity and negative predictive value  of 97 to 99%.  At this time, this assay has not been approved by the FDA to exclude DVT/VTE. Results should be correlated with clinical presentation. Performed at [facility], [address], [place], [place] [address]   Procalcitonin     Status: None   Collection Time: 11/21/18  8:38 AM  Result Value Ref Range   Procalcitonin 2.34 ng/mL    Comment:        Interpretation: PCT > 2 ng/mL: Systemic infection (sepsis) is likely, unless other causes are known. (NOTE)       Sepsis PCT Algorithm           Lower Respiratory Tract                                      Infection PCT Algorithm    ----------------------------     ----------------------------         PCT < 0.25 ng/mL                PCT < 0.10 ng/mL         Strongly encourage             Strongly discourage   discontinuation of antibiotics    initiation of antibiotics    ----------------------------     -----------------------------       PCT 0.25 - 0.50 ng/mL            PCT 0.10 - 0.25 ng/mL               OR       >80% decrease in PCT            Discourage initiation of  antibiotics      Encourage discontinuation           of antibiotics    ----------------------------     -----------------------------         PCT >= 0.50 ng/mL              PCT 0.26 - 0.50 ng/mL               AND       <80% decrease in PCT              Encourage initiation of                                             antibiotics       Encourage continuation           of antibiotics    ----------------------------     -----------------------------        PCT >= 0.50 ng/mL                  PCT > 0.50 ng/mL               AND         increase in PCT                  Strongly encourage                                      initiation of antibiotics    Strongly encourage escalation           of antibiotics                                     -----------------------------                                            PCT <= 0.25 ng/mL                                                 OR                                        > 80% decrease in PCT                                     Discontinue / Do not initiate                                             antibiotics Performed at [facility], [address] [place], [place] [address]   Lactate dehydrogenase     Status: None   Collection Time: 11/21/18  8:38 AM  Result Value Ref Range   LDH 137 98 - 192 U/L    Comment: Performed at [facility], [address] [place], [place] [address]  Ferritin     Status: None   Collection Time: 11/21/18  8:38 AM  Result Value Ref Range   Ferritin 277 11 - 307 ng/mL    Comment: Performed at [facility], [address] [place], [place] [address]  Triglycerides     Status: Abnormal   Collection Time: 11/21/18  8:38 AM  Result Value Ref Range   Triglycerides 150 (H) <150 mg/dL    Comment: Performed at [facility], [address] [place], [place] [address]  Fibrinogen     Status: Abnormal   Collection Time: 11/21/18  8:38 AM  Result Value Ref Range   Fibrinogen >800 (H) 210 - 475 mg/dL    Comment: Performed at [facility], [address] [place], [place] [address]  C-reactive protein     Status: Abnormal   Collection Time: 11/21/18  8:38 AM  Result Value Ref Range   CRP 20.7 (H) <1.0 mg/dL    Comment: Performed at [facility], [address] [place], [place] [address]  Magnesium     Status: None   Collection Time: 11/21/18  8:38 AM  Result Value Ref Range   Magnesium 1.7 1.7 - 2.4 mg/dL    Comment: Performed at [facility], [address] [place], [place] [address]   Ct Angio Chest Pe W Or Wo Contrast  Result Date: 11/21/2018 CLINICAL DATA:  Fever of unknown origin. Dyspnea. Generalized weakness. Nausea and vomiting. EXAM: CT ANGIOGRAPHY CHEST CT  ABDOMEN AND PELVIS WITH CONTRAST TECHNIQUE: Multidetector CT imaging of the chest was performed using the standard protocol during bolus administration of intravenous contrast. Multiplanar CT image reconstructions and MIPs were obtained to evaluate the vascular anatomy. Multidetector CT imaging of the abdomen and pelvis was performed using the standard protocol during bolus administration of intravenous contrast. CONTRAST:  100mL OMNIPAQUE IOHEXOL 350 MG/ML SOLN COMPARISON:  08/06/2009 CT chest, abdomen and pelvis. FINDINGS: CTA CHEST FINDINGS Cardiovascular: The study is low to moderate quality for the evaluation of pulmonary embolism, degraded by motion. There are no convincing filling defects in the central, lobar, segmental or subsegmental pulmonary artery branches to suggest acute pulmonary embolism. Atherosclerotic nonaneurysmal thoracic aorta. Normal caliber main pulmonary artery. Normal heart size. No significant pericardial fluid/thickening. Left anterior descending and left circumflex coronary atherosclerosis. Mediastinum/Nodes: No discrete thyroid nodules. Unremarkable esophagus. No pathologically enlarged axillary, mediastinal or hilar lymph nodes. Lungs/Pleura: No pneumothorax. No pleural effusion. No acute consolidative airspace disease, lung masses or significant pulmonary nodules. Musculoskeletal: No aggressive appearing focal osseous lesions. Mild T6 vertebral compression fracture is new since 2011 CT, of indeterminate chronicity. Moderate thoracic spondylosis. Review of the MIP images confirms the above findings. CT ABDOMEN and PELVIS FINDINGS Hepatobiliary: Normal liver size. Hypodense right liver dome 1.1 cm lesion (series 11/image 16), stable in size since 08/06/2009 CT, considered benign. Progressively enhancing 1.3 cm inferior right liver lesion (series 16/image 25), stable in size since 2011 CT, compatible with a benign hemangioma. Subcentimeter hypodense segment 3 left liver lobe lesion is  too small to characterize, not definitely seen on prior CT, for which no follow-up is required unless the patient has risk factors for liver malignancy. Scattered granulomatous liver calcifications. Cholelithiasis. No gallbladder wall thickening or pericholecystic fluid. No biliary ductal dilatation. Pancreas:  Normal, with no mass or duct dilation. Spleen: Normal size. No mass. Adrenals/Urinary Tract: Normal adrenals. Patchy bilateral contrast nephrograms with hazy perinephric fat, compatible with acute bilateral pyelonephritis. No renal masses or perinephric collections. No hydronephrosis. Normal bladder. Stomach/Bowel: Small hiatal hernia. Otherwise normal nondistended stomach. Normal caliber small bowel with no small bowel wall thickening. Normal appendix. Normal large bowel with no diverticulosis, large bowel wall thickening or pericolonic fat stranding. Vascular/Lymphatic: Atherosclerotic nonaneurysmal abdominal aorta. Patent portal, splenic, hepatic and renal veins. No pathologically enlarged lymph nodes in the abdomen or pelvis. Reproductive: Status post hysterectomy, with no abnormal findings at the vaginal cuff. Cystic 3.0 x 2.8 cm right adnexal lesion with suggestion of thin internal septation (series 11/image 65), which may represent growth of previously visualized 2.3 x 1.7 cm right adnexal cystic lesion on 2011 CT. No left adnexal mass. Other: No pneumoperitoneum, ascites or focal fluid collection. Musculoskeletal: No aggressive appearing focal osseous lesions. Marked lumbar spondylosis and facet arthropathy with chronic 9 mm anterolisthesis at L4-5. Review of the MIP images confirms the above findings. IMPRESSION: CT CHEST: 1. Limited motion degraded scan. No evidence of pulmonary embolism. No active pulmonary disease. 2. Two vessel coronary atherosclerosis. 3. Mild T6 vertebral compression fracture, new since 2011 CT, of indeterminate chronicity, chronic appearing. CT ABDOMEN / PELVIS: 1. CT findings  suggest acute bilateral pyelonephritis. No evidence of renal abscess. No hydronephrosis. 2. Septated 3.0 cm right adnexal cystic lesion, mildly increased from 2011 CT. Suggest nonemergent outpatient pelvic ultrasound for further evaluation. 3. Cholelithiasis. 4. Small hiatal hernia. 5.  Aortic Atherosclerosis (ICD10-I70.0). Electronically Signed   By: Delbert Phenix M.D.   On: 11/21/2018 17:23   Ct Abdomen Pelvis W Contrast  Result Date: 11/21/2018 CLINICAL DATA:  Fever of unknown origin. Dyspnea. Generalized weakness. Nausea and vomiting. EXAM: CT ANGIOGRAPHY CHEST CT ABDOMEN AND PELVIS WITH CONTRAST TECHNIQUE: Multidetector CT imaging of the chest was performed using the standard protocol during bolus administration of intravenous contrast. Multiplanar CT image reconstructions and MIPs were obtained to evaluate the vascular anatomy. Multidetector CT imaging of the abdomen and pelvis was performed using the standard protocol during bolus administration of intravenous contrast. CONTRAST:  OMNIPAQUE IOHEXOL 350 MG/ML SOLN COMPARISON:  08/06/2009 CT chest, abdomen and pelvis. FINDINGS: CTA CHEST FINDINGS Cardiovascular: The study is low to moderate quality for the evaluation of pulmonary embolism, degraded by motion. There are no convincing filling defects in the central, lobar, segmental or subsegmental pulmonary artery branches to suggest acute pulmonary embolism. Atherosclerotic nonaneurysmal thoracic aorta. Normal caliber main pulmonary artery. Normal heart size. No significant pericardial fluid/thickening. Left anterior descending and left circumflex coronary atherosclerosis. Mediastinum/Nodes: No discrete thyroid nodules. Unremarkable esophagus. No pathologically enlarged axillary, mediastinal or hilar lymph nodes. Lungs/Pleura: No pneumothorax. No pleural effusion. No acute consolidative airspace disease, lung masses or significant pulmonary nodules. Musculoskeletal: No aggressive appearing focal  osseous lesions. Mild T6 vertebral compression fracture is new since 2011 CT, of indeterminate chronicity. Moderate thoracic spondylosis. Review of the MIP images confirms the above findings. CT ABDOMEN and PELVIS FINDINGS Hepatobiliary: Normal liver size. Hypodense right liver dome 1.1 cm lesion (series 11/image 16), stable in size since 08/06/2009 CT, considered benign. Progressively enhancing 1.3 cm inferior right liver lesion (series 16/image 25), stable in size since 2011 CT, compatible with a benign hemangioma. Subcentimeter hypodense segment 3 left liver lobe lesion is too small to characterize, not definitely seen on prior CT, for which no follow-up is required unless the patient has risk factors for  liver malignancy. Scattered granulomatous liver calcifications. Cholelithiasis. No gallbladder wall thickening or pericholecystic fluid. No biliary ductal dilatation. Pancreas: Normal, with no mass or duct dilation. Spleen: Normal size. No mass. Adrenals/Urinary Tract: Normal adrenals. Patchy bilateral contrast nephrograms with hazy perinephric fat, compatible with acute bilateral pyelonephritis. No renal masses or perinephric collections. No hydronephrosis. Normal bladder. Stomach/Bowel: Small hiatal hernia. Otherwise normal nondistended stomach. Normal caliber small bowel with no small bowel wall thickening. Normal appendix. Normal large bowel with no diverticulosis, large bowel wall thickening or pericolonic fat stranding. Vascular/Lymphatic: Atherosclerotic nonaneurysmal abdominal aorta. Patent portal, splenic, hepatic and renal veins. No pathologically enlarged lymph nodes in the abdomen or pelvis. Reproductive: Status post hysterectomy, with no abnormal findings at the vaginal cuff. Cystic 3.0 x 2.8 cm right adnexal lesion with suggestion of thin internal septation (series 11/image 65), which may represent growth of previously visualized 2.3 x 1.7 cm right adnexal cystic lesion on 2011 CT. No left adnexal  mass. Other: No pneumoperitoneum, ascites or focal fluid collection. Musculoskeletal: No aggressive appearing focal osseous lesions. Marked lumbar spondylosis and facet arthropathy with chronic 9 mm anterolisthesis at L4-5. Review of the MIP images confirms the above findings. IMPRESSION: CT CHEST: 1. Limited motion degraded scan. No evidence of pulmonary embolism. No active pulmonary disease. 2. Two vessel coronary atherosclerosis. 3. Mild T6 vertebral compression fracture, new since 2011 CT, of indeterminate chronicity, chronic appearing. CT ABDOMEN / PELVIS: 1. CT findings suggest acute bilateral pyelonephritis. No evidence of renal abscess. No hydronephrosis. 2. Septated 3.0 cm right adnexal cystic lesion, mildly increased from 2011 CT. Suggest nonemergent outpatient pelvic ultrasound for further evaluation. 3. Cholelithiasis. 4. Small hiatal hernia. 5.  Aortic Atherosclerosis (ICD10-I70.0). Electronically Signed   By: Delbert Phenix M.D.   On: 11/21/2018 17:23   Dg Chest Port 1 View  Result Date: 11/21/2018 CLINICAL DATA:  Fever and chills for 1 week. EXAM: PORTABLE CHEST 1 VIEW COMPARISON:  October 29, 2018 FINDINGS: The heart size and mediastinal contours are within normal limits. Both lungs are clear. The visualized skeletal structures are stable. 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 IMPRESSION: No acute cardiopulmonary disease identified. Electronically Signed   By: Sherian Rein M.D.   On: 11/21/2018 08:55    Pending Labs Unresulted Labs (From admission, onward)    Start     Ordered   11/21/18 0810  Urinalysis, Routine w reflex microscopic  ONCE - STAT,   STAT     11/21/18 0809   11/21/18 0810  Urine culture  ONCE - STAT,   STAT     11/21/18 0809   11/21/18 0809  Blood Culture (routine x 2)  BLOOD CULTURE X 2,   STAT     11/21/18 0809   Signed and Held  HIV antibody (Routine Testing)  Once,   R     Signed and Held   Signed and Held  CBC  (enoxaparin (LOVENOX)     CrCl >/= 30 ml/min)  Once,   R    Comments: Baseline for enoxaparin therapy IF NOT ALREADY DRAWN.  Notify MD if PLT < 100 K.    Signed and Held   Signed and Held  Creatinine, serum  (enoxaparin (LOVENOX)    CrCl >/= 30 ml/min)  Once,   R    Comments: Baseline for enoxaparin therapy IF NOT ALREADY DRAWN.    Signed and Held   Signed and Held  Creatinine, serum  (enoxaparin (LOVENOX)    CrCl >/= 30  ml/min)  Weekly,   R    Comments: while on enoxaparin therapy    Signed and Held   Signed and Held  Comprehensive metabolic panel  Tomorrow morning,   R     Signed and Held          Vitals/Pain Today's Vitals   11/21/18 1345 11/21/18 1500 11/21/18 1530 11/21/18 1600  BP:  101/79  107/67  Pulse:  71 85 71  Resp:    19  Temp:      TempSrc:      SpO2:  99% 98% 100%  Weight:      Height:      PainSc: 0-No pain       Isolation Precautions Airborne and Contact precautions  Medications Medications  0.9 %  sodium chloride infusion (0 mLs Intravenous Stopped 11/21/18 1542)  vancomycin (VANCOCIN) IVPB 750 mg/150 ml premix (has no administration in time range)  ceFEPIme (MAXIPIME) 2 g in sodium chloride 0.9 % 100 mL IVPB (has no administration in time range)  sodium chloride (PF) 0.9 % injection (has no administration in time range)  acetaminophen (TYLENOL) tablet 650 mg (650 mg Oral Given 11/21/18 0848)  potassium chloride SA (K-DUR) CR tablet 40 mEq (40 mEq Oral Given 11/21/18 1015)  potassium chloride 10 mEq in 100 mL IVPB (0 mEq Intravenous Stopped 11/21/18 1354)  sodium chloride 0.9 % bolus 1,000 mL (0 mLs Intravenous Stopped 11/21/18 1246)  lactated ringers bolus 500 mL (0 mLs Intravenous Stopped 11/21/18 1733)  ceFEPIme (MAXIPIME) 2 g in sodium chloride 0.9 % 100 mL IVPB (0 g Intravenous Stopped 11/21/18 1635)  vancomycin (VANCOCIN) 1,250 mg in sodium chloride 0.9 % 250 mL IVPB (1,250 mg Intravenous New Bag/Given 11/21/18 1555)  iohexol (OMNIPAQUE) 350 MG/ML injection 100 mL (100 mLs  Intravenous Contrast Given 11/21/18 1625)    Mobility walks Low fall risk   Focused Assessments    R Recommendations: See Admitting Provider Note  Report given to:   Additional Notes:

## 2018-11-21 NOTE — H&P (Signed)
History and Physical    Jody Mcclure DVV:[id number] DOB: Jun 15, 1955 DOA: 11/21/2018  PCP: Thom Chimes, MD  Patient coming from: home  I have personally briefly reviewed patient's old medical records in [facility]  Chief Complaint: malaise  HPI: Jody Mcclure is Jody Mcclure 63 y.o. female with medical history significant of anxiety, depression, HTN, HLD presenting with general malaise over the past week.  She notes that she started have subjective fever and chills.  She also just noticed shaking chills.  Her symptoms have been present over the past week, but have gotten worse over the past day or so.  She denies cough, SOB, CP, abdominal pain, dysuria or UTI symptoms.  She denies sick contacts.  Denies smoking.  She does drink alcohol.  Also hx of cocaine use.  ED Course: Labs, imaging.  Admit for elevated inflammatory markers, concern for infection.    Review of Systems: As per HPI otherwise 10 point review of systems negative.    Past Medical History:  Diagnosis Date   Anxiety    Depression    Hyperlipidemia    Hypertension     Past Surgical History:  Procedure Laterality Date   ABDOMINAL HYSTERECTOMY     TONSILLECTOMY       reports that she quit smoking about 10 months ago. Her smoking use included cigarettes. She smoked 0.50 packs per day. She has never used smokeless tobacco. She reports current alcohol use of about 2.0 standard drinks of alcohol per week. She reports current drug use. Drug: Cocaine.  Allergies  Allergen Reactions   Codeine Nausea And Vomiting   Sulfa Antibiotics Rash    Family History  Problem Relation Age of Onset   Cancer Mother        lung   Hyperlipidemia Mother    Hypertension Father    Hyperlipidemia Father    Colon cancer Neg Hx    Colon polyps Neg Hx    Rectal cancer Neg Hx    Stomach cancer Neg Hx     Prior to Admission medications   Medication Sig Start Date End Date Taking? Authorizing Provider  amLODipine (NORVASC)  5 MG tablet TAKE 1 TABLET BY MOUTH EVERY DAY Patient taking differently: Take 5 mg by mouth daily.  08/20/18  Yes Hoffman, Jessica Ratliff, DO  betamethasone valerate ointment (VALISONE) 0.1 % APPLY 1 APPLICATION TOPICALLY 2 (TWO) TIMES DAILY. 10/04/18  Yes Hoffman, Bary Richard, DO  Incontinence Supply Disposable (DEPEND EASY FIT UNDERGARMENTS) MISC 1 Units by Does not apply route 3 (three) times daily. 03/30/18  Yes Hoffman, Jessica Ratliff, DO  sertraline (ZOLOFT) 50 MG tablet TAKE 3 TABLETS BY MOUTH EVERY DAY Patient taking differently: Take 150 mg by mouth daily.  09/18/18  Yes Tyson Alias, MD  Tiotropium Bromide-Olodaterol (STIOLTO RESPIMAT) 2.5-2.5 MCG/ACT AERS Inhale 2 puffs into the lungs daily. 08/15/18  Yes Hoffman, Jessica Ratliff, DO  cyclobenzaprine (FLEXERIL) 5 MG tablet TAKE 1 TABLET BY MOUTH THREE TIMES Emry Barbato DAY AS NEEDED FOR MUSCLE SPASMS Patient not taking: Reported on 12/28/2017 10/11/17   Levert Feinstein, MD  hydrOXYzine (ATARAX/VISTARIL) 10 MG tablet TAKE 1 TABLET BY MOUTH THREE TIMES Somtochukwu Woollard DAY AS NEEDED Patient not taking: Reported on 12/28/2017 10/11/17   Levert Feinstein, MD  predniSONE (STERAPRED UNI-PAK 21 TAB) 10 MG (21) TBPK tablet Take by mouth daily. Take 6 tabs by mouth daily  for 1 days, then 5 tabs for 2 days, then 4 tabs for 2 days, then 3 tabs  for 2 days, 2 tabs for 2 days, then 1 tab by mouth daily for 2 days Patient not taking: Reported on 08/23/2018 02/05/18   Dietrich PatesKhatri, Hina, PA-C  senna-docusate (SENOKOT-S) 8.6-50 MG tablet Take 1 tablet by mouth daily. Patient not taking: Reported on 12/28/2017 11/15/17   Geralyn CorwinHoffman, Jessica Ratliff, DO  omeprazole (PRILOSEC OTC) 20 MG tablet Take 1 tablet (20 mg total) by mouth daily. 12/13/12 04/09/13  Reva BoresPratt, Tanya S, MD    Physical Exam: Vitals:   11/21/18 1600 11/21/18 1821 11/21/18 2011 11/21/18 2027  BP: 107/67 (!) 142/76  101/61  Pulse: 71 96  79  Resp: 19 (!) 22  18  Temp:  (!) 100.4 F (38 C)  (!) 101.6 F (38.7 C)   TempSrc:  Oral  Oral  SpO2: 100% 100% 97% 98%  Weight:  58.1 kg    Height:  5' (1.524 m)      Constitutional: NAD, calm, comfortable Vitals:   11/21/18 1600 11/21/18 1821 11/21/18 2011 11/21/18 2027  BP: 107/67 (!) 142/76  101/61  Pulse: 71 96  79  Resp: 19 (!) 22  18  Temp:  (!) 100.4 F (38 C)  (!) 101.6 F (38.7 C)  TempSrc:  Oral  Oral  SpO2: 100% 100% 97% 98%  Weight:  58.1 kg    Height:  5' (1.524 m)     Eyes: PERRL, lids and conjunctivae normal ENMT: Mucous membranes are moist. Posterior pharynx clear of any exudate or lesions.Normal dentition.  Neck: normal, supple, no masses, no thyromegaly Respiratory: clear to auscultation bilaterally, no wheezing, no crackles.  Cardiovascular: Regular rate and rhythm, no murmurs / rubs / gallops. No extremity edema. 2+ pedal pulses.  Abdomen: no tenderness, no masses palpated. No hepatosplenomegaly. No CVA tenderness. Musculoskeletal: no clubbing / cyanosis. No joint deformity upper and lower extremities. Good ROM, no contractures. Normal muscle tone.  Skin: no rashes, lesions, ulcers. No induration Neurologic: CN 2-12 grossly intact. Sensation intact. Moving all extremities. Psychiatric: Normal judgment and insight. Alert and oriented x 3. Normal mood.   Labs on Admission: I have personally reviewed following labs and imaging studies  CBC: Recent Labs  Lab 11/21/18 0838 11/21/18 1932  WBC 15.6* 11.8*  NEUTROABS 12.4*  --   HGB 11.9* 9.4*  HCT 35.9* 29.7*  MCV 90.9 92.8  PLT 278 226   Basic Metabolic Panel: Recent Labs  Lab 11/21/18 0838 11/21/18 1932  NA 135  --   K 2.7*  --   CL 101  --   CO2 22  --   GLUCOSE 134*  --   BUN 10  --   CREATININE 0.94 0.82  CALCIUM 8.3*  --   MG 1.7  --    GFR: Estimated Creatinine Clearance: 56.7 mL/min (by C-G formula based on SCr of 0.82 mg/dL). Liver Function Tests: Recent Labs  Lab 11/21/18 0838  AST 51*  ALT 51*  ALKPHOS 107  BILITOT 1.3*  PROT 7.7  ALBUMIN  3.2*   No results for input(s): LIPASE, AMYLASE in the last 168 hours. No results for input(s): AMMONIA in the last 168 hours. Coagulation Profile: No results for input(s): INR, PROTIME in the last 168 hours. Cardiac Enzymes: No results for input(s): CKTOTAL, CKMB, CKMBINDEX, TROPONINI in the last 168 hours. BNP (last 3 results) No results for input(s): PROBNP in the last 8760 hours. HbA1C: No results for input(s): HGBA1C in the last 72 hours. CBG: No results for input(s): GLUCAP in the last 168 hours. Lipid  Profile: Recent Labs    11/21/18 0838  TRIG 150*   Thyroid Function Tests: No results for input(s): TSH, T4TOTAL, FREET4, T3FREE, THYROIDAB in the last 72 hours. Anemia Panel: Recent Labs    11/21/18 0838  FERRITIN 277   Urine analysis:    Component Value Date/Time   COLORURINE YELLOW 02/23/2013 1058   APPEARANCEUR CLEAR 02/23/2013 1058   LABSPEC 1.017 02/23/2013 1058   PHURINE 5.5 02/23/2013 1058   GLUCOSEU NEGATIVE 02/23/2013 1058   HGBUR TRACE (Madelina Sanda) 02/23/2013 1058   BILIRUBINUR NEGATIVE 02/23/2013 1058   KETONESUR NEGATIVE 02/23/2013 1058   PROTEINUR NEGATIVE 02/23/2013 1058   UROBILINOGEN 0.2 02/23/2013 1058   NITRITE NEGATIVE 02/23/2013 1058   LEUKOCYTESUR NEGATIVE 02/23/2013 1058    Radiological Exams on Admission: Ct Angio Chest Pe W Or Wo Contrast  Result Date: 11/21/2018 CLINICAL DATA:  Fever of unknown origin. Dyspnea. Generalized weakness. Nausea and vomiting. EXAM: CT ANGIOGRAPHY CHEST CT ABDOMEN AND PELVIS WITH CONTRAST TECHNIQUE: Multidetector CT imaging of the chest was performed using the standard protocol during bolus administration of intravenous contrast. Multiplanar CT image reconstructions and MIPs were obtained to evaluate the vascular anatomy. Multidetector CT imaging of the abdomen and pelvis was performed using the standard protocol during bolus administration of intravenous contrast. CONTRAST:  100mL OMNIPAQUE IOHEXOL 350 MG/ML SOLN  COMPARISON:  08/06/2009 CT chest, abdomen and pelvis. FINDINGS: CTA CHEST FINDINGS Cardiovascular: The study is low to moderate quality for the evaluation of pulmonary embolism, degraded by motion. There are no convincing filling defects in the central, lobar, segmental or subsegmental pulmonary artery branches to suggest acute pulmonary embolism. Atherosclerotic nonaneurysmal thoracic aorta. Normal caliber main pulmonary artery. Normal heart size. No significant pericardial fluid/thickening. Left anterior descending and left circumflex coronary atherosclerosis. Mediastinum/Nodes: No discrete thyroid nodules. Unremarkable esophagus. No pathologically enlarged axillary, mediastinal or hilar lymph nodes. Lungs/Pleura: No pneumothorax. No pleural effusion. No acute consolidative airspace disease, lung masses or significant pulmonary nodules. Musculoskeletal: No aggressive appearing focal osseous lesions. Mild T6 vertebral compression fracture is new since 2011 CT, of indeterminate chronicity. Moderate thoracic spondylosis. Review of the MIP images confirms the above findings. CT ABDOMEN and PELVIS FINDINGS Hepatobiliary: Normal liver size. Hypodense right liver dome 1.1 cm lesion (series 11/image 16), stable in size since 08/06/2009 CT, considered benign. Progressively enhancing 1.3 cm inferior right liver lesion (series 16/image 25), stable in size since 2011 CT, compatible with Ashna Dorough benign hemangioma. Subcentimeter hypodense segment 3 left liver lobe lesion is too small to characterize, not definitely seen on prior CT, for which no follow-up is required unless the patient has risk factors for liver malignancy. Scattered granulomatous liver calcifications. Cholelithiasis. No gallbladder wall thickening or pericholecystic fluid. No biliary ductal dilatation. Pancreas: Normal, with no mass or duct dilation. Spleen: Normal size. No mass. Adrenals/Urinary Tract: Normal adrenals. Patchy bilateral contrast nephrograms with  hazy perinephric fat, compatible with acute bilateral pyelonephritis. No renal masses or perinephric collections. No hydronephrosis. Normal bladder. Stomach/Bowel: Small hiatal hernia. Otherwise normal nondistended stomach. Normal caliber small bowel with no small bowel wall thickening. Normal appendix. Normal large bowel with no diverticulosis, large bowel wall thickening or pericolonic fat stranding. Vascular/Lymphatic: Atherosclerotic nonaneurysmal abdominal aorta. Patent portal, splenic, hepatic and renal veins. No pathologically enlarged lymph nodes in the abdomen or pelvis. Reproductive: Status post hysterectomy, with no abnormal findings at the vaginal cuff. Cystic 3.0 x 2.8 cm right adnexal lesion with suggestion of thin internal septation (series 11/image 65), which may represent growth of previously visualized 2.3  x 1.7 cm right adnexal cystic lesion on 2011 CT. No left adnexal mass. Other: No pneumoperitoneum, ascites or focal fluid collection. Musculoskeletal: No aggressive appearing focal osseous lesions. Marked lumbar spondylosis and facet arthropathy with chronic 9 mm anterolisthesis at L4-5. Review of the MIP images confirms the above findings. IMPRESSION: CT CHEST: 1. Limited motion degraded scan. No evidence of pulmonary embolism. No active pulmonary disease. 2. Two vessel coronary atherosclerosis. 3. Mild T6 vertebral compression fracture, new since 2011 CT, of indeterminate chronicity, chronic appearing. CT ABDOMEN / PELVIS: 1. CT findings suggest acute bilateral pyelonephritis. No evidence of renal abscess. No hydronephrosis. 2. Septated 3.0 cm right adnexal cystic lesion, mildly increased from 2011 CT. Suggest nonemergent outpatient pelvic ultrasound for further evaluation. 3. Cholelithiasis. 4. Small hiatal hernia. 5.  Aortic Atherosclerosis (ICD10-I70.0). Electronically Signed   By: Ilona Sorrel M.D.   On: 11/21/2018 17:23   Ct Abdomen Pelvis W Contrast  Result Date: 11/21/2018 CLINICAL  DATA:  Fever of unknown origin. Dyspnea. Generalized weakness. Nausea and vomiting. EXAM: CT ANGIOGRAPHY CHEST CT ABDOMEN AND PELVIS WITH CONTRAST TECHNIQUE: Multidetector CT imaging of the chest was performed using the standard protocol during bolus administration of intravenous contrast. Multiplanar CT image reconstructions and MIPs were obtained to evaluate the vascular anatomy. Multidetector CT imaging of the abdomen and pelvis was performed using the standard protocol during bolus administration of intravenous contrast. CONTRAST:  175mL OMNIPAQUE IOHEXOL 350 MG/ML SOLN COMPARISON:  08/06/2009 CT chest, abdomen and pelvis. FINDINGS: CTA CHEST FINDINGS Cardiovascular: The study is low to moderate quality for the evaluation of pulmonary embolism, degraded by motion. There are no convincing filling defects in the central, lobar, segmental or subsegmental pulmonary artery branches to suggest acute pulmonary embolism. Atherosclerotic nonaneurysmal thoracic aorta. Normal caliber main pulmonary artery. Normal heart size. No significant pericardial fluid/thickening. Left anterior descending and left circumflex coronary atherosclerosis. Mediastinum/Nodes: No discrete thyroid nodules. Unremarkable esophagus. No pathologically enlarged axillary, mediastinal or hilar lymph nodes. Lungs/Pleura: No pneumothorax. No pleural effusion. No acute consolidative airspace disease, lung masses or significant pulmonary nodules. Musculoskeletal: No aggressive appearing focal osseous lesions. Mild T6 vertebral compression fracture is new since 2011 CT, of indeterminate chronicity. Moderate thoracic spondylosis. Review of the MIP images confirms the above findings. CT ABDOMEN and PELVIS FINDINGS Hepatobiliary: Normal liver size. Hypodense right liver dome 1.1 cm lesion (series 11/image 16), stable in size since 08/06/2009 CT, considered benign. Progressively enhancing 1.3 cm inferior right liver lesion (series 16/image 25), stable in size  since 2011 CT, compatible with Fransisco Messmer benign hemangioma. Subcentimeter hypodense segment 3 left liver lobe lesion is too small to characterize, not definitely seen on prior CT, for which no follow-up is required unless the patient has risk factors for liver malignancy. Scattered granulomatous liver calcifications. Cholelithiasis. No gallbladder wall thickening or pericholecystic fluid. No biliary ductal dilatation. Pancreas: Normal, with no mass or duct dilation. Spleen: Normal size. No mass. Adrenals/Urinary Tract: Normal adrenals. Patchy bilateral contrast nephrograms with hazy perinephric fat, compatible with acute bilateral pyelonephritis. No renal masses or perinephric collections. No hydronephrosis. Normal bladder. Stomach/Bowel: Small hiatal hernia. Otherwise normal nondistended stomach. Normal caliber small bowel with no small bowel wall thickening. Normal appendix. Normal large bowel with no diverticulosis, large bowel wall thickening or pericolonic fat stranding. Vascular/Lymphatic: Atherosclerotic nonaneurysmal abdominal aorta. Patent portal, splenic, hepatic and renal veins. No pathologically enlarged lymph nodes in the abdomen or pelvis. Reproductive: Status post hysterectomy, with no abnormal findings at the vaginal cuff. Cystic 3.0 x 2.8 cm right  adnexal lesion with suggestion of thin internal septation (series 11/image 65), which may represent growth of previously visualized 2.3 x 1.7 cm right adnexal cystic lesion on 2011 CT. No left adnexal mass. Other: No pneumoperitoneum, ascites or focal fluid collection. Musculoskeletal: No aggressive appearing focal osseous lesions. Marked lumbar spondylosis and facet arthropathy with chronic 9 mm anterolisthesis at L4-5. Review of the MIP images confirms the above findings. IMPRESSION: CT CHEST: 1. Limited motion degraded scan. No evidence of pulmonary embolism. No active pulmonary disease. 2. Two vessel coronary atherosclerosis. 3. Mild T6 vertebral compression  fracture, new since 2011 CT, of indeterminate chronicity, chronic appearing. CT ABDOMEN / PELVIS: 1. CT findings suggest acute bilateral pyelonephritis. No evidence of renal abscess. No hydronephrosis. 2. Septated 3.0 cm right adnexal cystic lesion, mildly increased from 2011 CT. Suggest nonemergent outpatient pelvic ultrasound for further evaluation. 3. Cholelithiasis. 4. Small hiatal hernia. 5.  Aortic Atherosclerosis (ICD10-I70.0). Electronically Signed   By: Delbert PhenixJason Kharter Brew Poff M.D.   On: 11/21/2018 17:23   Dg Chest Port 1 View  Result Date: 11/21/2018 CLINICAL DATA:  Fever and chills for 1 week. EXAM: PORTABLE CHEST 1 VIEW COMPARISON:  October 29, 2018 FINDINGS: The heart size and mediastinal contours are within normal limits. Both lungs are clear. The visualized skeletal structures are stable. 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 IMPRESSION: No acute cardiopulmonary disease identified. Electronically Signed   By: Sherian ReinWei-Chen  Lin M.D.   On: 11/21/2018 08:55    EKG: Independently reviewed. Sinus rhythm, prolonged QTc.  Wandering baseline.  Poor quality EKG.  Assessment/Plan Active Problems:   Sepsis (HCC)   Fever and chills  Sepsis 2/2 Acute Bilateral Pyelonephritis: Pt without clear UTI symptoms, but imaging suggestive of acute bilateral pyelonephritis.  UA and culture collected, but pending.  Her symptoms were pretty nonspecific, but imaging suggests UTI. Negative COVID 19 testing CTA without evidence of pneumonia or PE (limited study) Blood cx pending Vanc/cefepime -> narrow to cefepime based on imaging findings - narrow as able  Hypokalemia: replace and follow.  Follow magnesium  Elevated LFTs: likely 2/2 sepsis above.  Follow acute hepatitis panel.  Elevated inflammatory markers: elevated CRP, fibrinogen, d dimer - likely 2/2 sepsis above - of note, CTA without evidence of PE (poor quality study).  Consider LE US, but suspect elevated d dimer related to sepsis with  no exam findings concerning for LE DVT.  Hypertension: hold amlodipine with sepsis  Depression   anxiety: continue zoloft  ?COPD: continue stiolto (pharmacy alternative).  No chart hx of this.  Needs outpatient follow up.  Abnormal EKG: Poor quality EKG with wandering baseline, repeat in AM (poor baseline, prolonged QTc), no CP or SOB or sx concerning for ACS.    Septated 3.0 cm R adnexal cystic lesion: increased from 2011 CT, needs nonemergent outpatient pelvic ultrasound  Mild T6 Vertebral Compression fracture: indeterminate chronicity, follow outpatient, needs outpatient f/u and treatment of osteoporosis  DVT prophylaxis: lovenox  Code Status: full, discussed with pt  Family Communication: none at bedside, pt to update sig other  Disposition Plan: pending further improvement, culture results - inpatient due to sepsis and risk of decompensation with acute bilateral pyelonephritis Consults called: none Admission status: tele    Lacretia Nicksaldwell Powell MD [facility] Pager AMION  If 7PM-7AM, please contact night-coverage [email or web address] Password [email or web address]  11/21/2018, 9:16 PM

## 2018-11-21 NOTE — ED Notes (Signed)
Informed provider of lab result K = 2.7

## 2018-11-21 NOTE — ED Triage Notes (Addendum)
EMS reports from home, general malaise and, shakes and chills x 1 week. 1 emesis this morning.  BP 110/65 HR 87 RR 22 Sp02 98 RA CBG 182 Temp 99.7  4mg  Zofran enroute  18 L forearm

## 2018-11-21 NOTE — ED Provider Notes (Signed)
[facility]-EMERGENCY DEPT Provider Note   CSN: [id number] Arrival date & time: 11/21/18  0703     History   Chief Complaint Chief Complaint  Patient presents with  . Fatigue  . Chills  . Fever    HPI Jody Mcclure is a 63 y.o. female.  She is complaining of feeling sick for a day or 2 with some aches in her legs and feeling generally unwell and started with some chills overnight had a fever this morning.  She said she had one episode of vomiting.  Minimal cough no sore throat no headache no chest pain no abdominal pain no diarrhea no urinary symptoms.  No sick contacts or recent travel.  No recent diagnosis of Covid.  Has tried nothing for her fever.  Received Zofran by EMS with improvement in her nausea.      Fever Max temp prior to arrival:  102 Severity:  Moderate Onset quality:  Unable to specify Timing:  Unable to specify Progression:  Unchanged Chronicity:  New Relieved by:  None tried Worsened by:  Nothing Ineffective treatments:  None tried Associated symptoms: chills, cough, myalgias, nausea and vomiting   Associated symptoms: no chest pain, no confusion, no congestion, no diarrhea, no dysuria, no headaches, no rash, no rhinorrhea and no sore throat   Risk factors: no immunosuppression, no recent sickness, no recent travel and no sick contacts     Past Medical History:  Diagnosis Date  . Anxiety   . Depression   . Hyperlipidemia   . Hypertension     Patient Active Problem List   Diagnosis Date Noted  . Stress incontinence 04/01/2018  . Dyspnea on exertion 03/05/2018  . Mild dementia (HCC) 11/15/2017  . Constipation 11/15/2017  . Encounter for screening mammogram for breast cancer 06/03/2016  . Lumbar radiculopathy, chronic 06/03/2016  . Generalized anxiety disorder 06/03/2016  . Healthcare maintenance 06/03/2016  . GERD (gastroesophageal reflux disease) 12/13/2012  . Depression 12/13/2012  . Dysphagia 04/18/2012  . Hypertension  08/15/2011  . Rash and nonspecific skin eruption 08/15/2011    Past Surgical History:  Procedure Laterality Date  . ABDOMINAL HYSTERECTOMY    . TONSILLECTOMY       OB History    Gravida  2   Para      Term      Preterm      AB      Living  2     SAB      TAB      Ectopic      Multiple      Live Births               Home Medications    Prior to Admission medications   Medication Sig Start Date End Date Taking? Authorizing Provider  amLODipine (NORVASC) 5 MG tablet TAKE 1 TABLET BY MOUTH EVERY DAY Patient taking differently: Take 5 mg by mouth daily.  08/20/18   Hoffman, Shanda BumpsJessica Ratliff, DO  betamethasone valerate ointment (VALISONE) 0.1 % APPLY 1 APPLICATION TOPICALLY 2 (TWO) TIMES DAILY. 10/04/18   Hoffman, Shanda BumpsJessica Ratliff, DO  cyclobenzaprine (FLEXERIL) 5 MG tablet TAKE 1 TABLET BY MOUTH THREE TIMES A DAY AS NEEDED FOR MUSCLE SPASMS Patient not taking: Reported on 12/28/2017 10/11/17   Levert FeinsteinGranfortuna, James M, MD  hydrOXYzine (ATARAX/VISTARIL) 10 MG tablet TAKE 1 TABLET BY MOUTH THREE TIMES A DAY AS NEEDED Patient not taking: Reported on 12/28/2017 10/11/17   Levert FeinsteinGranfortuna, James M, MD  Incontinence Supply Disposable (  DEPEND EASY FIT UNDERGARMENTS) MISC 1 Units by Does not apply route 3 (three) times daily. 03/30/18   Kalman Shan Ratliff, DO  predniSONE (STERAPRED UNI-PAK 21 TAB) 10 MG (21) TBPK tablet Take by mouth daily. Take 6 tabs by mouth daily  for 1 days, then 5 tabs for 2 days, then 4 tabs for 2 days, then 3 tabs for 2 days, 2 tabs for 2 days, then 1 tab by mouth daily for 2 days Patient not taking: Reported on 08/23/2018 02/05/18   Delia Heady, PA-C  senna-docusate (SENOKOT-S) 8.6-50 MG tablet Take 1 tablet by mouth daily. Patient not taking: Reported on 12/28/2017 11/15/17   Kalman Shan Ratliff, DO  sertraline (ZOLOFT) 50 MG tablet TAKE 3 TABLETS BY MOUTH EVERY DAY 09/18/18   Axel Filler, MD  Tiotropium Bromide-Olodaterol (STIOLTO RESPIMAT)  2.5-2.5 MCG/ACT AERS Inhale 2 puffs into the lungs daily. 08/15/18   Kalman Shan Ratliff, DO  omeprazole (PRILOSEC OTC) 20 MG tablet Take 1 tablet (20 mg total) by mouth daily. 12/13/12 04/09/13  Donnamae Jude, MD    Family History Family History  Problem Relation Age of Onset  . Cancer Mother        lung  . Hyperlipidemia Mother   . Hypertension Father   . Hyperlipidemia Father   . Colon cancer Neg Hx   . Colon polyps Neg Hx   . Rectal cancer Neg Hx   . Stomach cancer Neg Hx     Social History Social History   Tobacco Use  . Smoking status: Former Smoker    Packs/day: 0.50    Types: Cigarettes    Quit date: 01/03/2018    Years since quitting: 0.8  . Smokeless tobacco: Never Used  . Tobacco comment: 1 puff occassionally  Substance Use Topics  . Alcohol use: Yes    Alcohol/week: 2.0 standard drinks    Types: 2 Cans of beer per week    Comment: 1 daily  . Drug use: Yes    Types: Cocaine     Allergies   Codeine and Sulfa antibiotics   Review of Systems Review of Systems  Constitutional: Positive for chills and fever.  HENT: Negative for congestion, rhinorrhea and sore throat.   Eyes: Negative for visual disturbance.  Respiratory: Positive for cough.   Cardiovascular: Negative for chest pain.  Gastrointestinal: Positive for nausea and vomiting. Negative for diarrhea.  Genitourinary: Negative for dysuria.  Musculoskeletal: Positive for myalgias.  Skin: Negative for rash.  Neurological: Negative for headaches.  Psychiatric/Behavioral: Negative for confusion.     Physical Exam Updated Vital Signs BP 102/69   Pulse 79   Temp (!) 102.4 F (39.1 C) (Oral)   Resp 20   Ht 5' (1.524 m)   Wt 54.4 kg   SpO2 97%   BMI 23.44 kg/m   Physical Exam Vitals signs and nursing note reviewed.  Constitutional:      General: She is not in acute distress.    Appearance: She is well-developed.  HENT:     Head: Normocephalic and atraumatic.  Eyes:      Conjunctiva/sclera: Conjunctivae normal.  Neck:     Musculoskeletal: Normal range of motion and neck supple.  Cardiovascular:     Rate and Rhythm: Normal rate and regular rhythm.     Heart sounds: No murmur.  Pulmonary:     Effort: Pulmonary effort is normal. No respiratory distress.     Breath sounds: Normal breath sounds.  Abdominal:     Palpations:  Abdomen is soft.     Tenderness: There is no abdominal tenderness. There is no guarding or rebound.  Musculoskeletal: Normal range of motion.     Right lower leg: No edema.     Left lower leg: No edema.  Skin:    General: Skin is warm and dry.     Capillary Refill: Capillary refill takes less than 2 seconds.  Neurological:     General: No focal deficit present.     Mental Status: She is alert.     Sensory: No sensory deficit.     Motor: No weakness.      ED Treatments / Results  Labs (all labs ordered are listed, but only abnormal results are displayed) Labs Reviewed  CBC WITH DIFFERENTIAL/PLATELET - Abnormal; Notable for the following components:      Result Value   WBC 15.6 (*)    Hemoglobin 11.9 (*)    HCT 35.9 (*)    Neutro Abs 12.4 (*)    Lymphs Abs 0.4 (*)    Monocytes Absolute 2.0 (*)    Abs Immature Granulocytes 0.72 (*)    All other components within normal limits  COMPREHENSIVE METABOLIC PANEL - Abnormal; Notable for the following components:   Potassium 2.7 (*)    Glucose, Bld 134 (*)    Calcium 8.3 (*)    Albumin 3.2 (*)    AST 51 (*)    ALT 51 (*)    Total Bilirubin 1.3 (*)    All other components within normal limits  D-DIMER, QUANTITATIVE (NOT AT [facility]) - Abnormal; Notable for the following components:   D-Dimer, Quant 2.67 (*)    All other components within normal limits  TRIGLYCERIDES - Abnormal; Notable for the following components:   Triglycerides 150 (*)    All other components within normal limits  FIBRINOGEN - Abnormal; Notable for the following components:   Fibrinogen >800 (*)    All other  components within normal limits  C-REACTIVE PROTEIN - Abnormal; Notable for the following components:   CRP 20.7 (*)    All other components within normal limits  SARS CORONAVIRUS 2 (HOSPITAL ORDER, PERFORMED IN [facility])  CULTURE, BLOOD (ROUTINE X 2)  CULTURE, BLOOD (ROUTINE X 2)  URINE CULTURE  LACTIC ACID, PLASMA  PROCALCITONIN  LACTATE DEHYDROGENASE  FERRITIN  MAGNESIUM  URINALYSIS, ROUTINE W REFLEX MICROSCOPIC    EKG EKG Interpretation  Date/Time:  Wednesday November 21 2018 09:07:29 EDT Ventricular Rate:  70 PR Interval:    QRS Duration: 123 QT Interval:  462 QTC Calculation: 499 R Axis:   16 Text Interpretation:  Sinus rhythm nonspecific st/ts Baseline wander in lead(s) I similar to prior 11/19 Confirmed by Meridee ScoreButler, Nassim Cosma [phone]) on 11/21/2018 9:13:14 AM Also confirmed by Meridee ScoreButler, Jahmya Onofrio [phone]), editor Barbette Hairassel, Kerry [phone])  on 11/21/2018 10:34:08 AM   Radiology Dg Chest Port 1 View  Result Date: 11/21/2018 CLINICAL DATA:  Fever and chills for 1 week. EXAM: PORTABLE CHEST 1 VIEW COMPARISON:  October 29, 2018 FINDINGS: The heart size and mediastinal contours are within normal limits. Both lungs are clear. The visualized skeletal structures are stable. 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 IMPRESSION: No acute cardiopulmonary disease identified. Electronically Signed   By: Sherian ReinWei-Chen  Lin M.D.   On: 11/21/2018 08:55    Procedures Procedures (including critical care time)  Medications Ordered in ED Medications  0.9 %  sodium chloride infusion (1,000 mLs Intravenous New Bag/Given 11/21/18 0848)  acetaminophen (TYLENOL)  tablet 650 mg (650 mg Oral Given 11/21/18 0848)  potassium chloride SA (K-DUR) CR tablet 40 mEq (40 mEq Oral Given 11/21/18 1015)  potassium chloride 10 mEq in 100 mL IVPB (10 mEq Intravenous New Bag/Given 11/21/18 1152)  sodium chloride 0.9 % bolus 1,000 mL (0 mLs Intravenous Stopped 11/21/18 1246)     Initial  Impression / Assessment and Plan / ED Course  I have reviewed the triage vital signs and the nursing notes.  Pertinent labs & imaging results that were available during my care of the patient were reviewed by me and considered in my medical decision making (see chart for details).  Clinical Course as of Nov 21 1651  Wed Nov 21, 2018  [id number] 63 year old female here with malaise body aches and fever with minimal cough, 1 episode of vomiting.  She is nontoxic-appearing.  Febrile to 102.4 here.  She is getting a fever work-up included Covid testing   [MB]  0902 0.6Patient's chest x-ray does not show any obvious infiltrate.  Her lab work is just beginning to come back now when she is got an elevated white blood cell count of 15.6.  Her hemoglobin is also dropped to 11.9 from a baseline of between 13 and 14.   [MB]  0948 Patient's potassium back critical low at 2.7.  I have ordered her IV and p.o. repletion.  We will add a magnesium.   [MB]  [id number] She was finally able to urinate and it looks rather dark.  We will also give her some more fluids.   [MB]  1222 Patient states she feels improved.  She is asking for something to eat and drink.  She does have an elevated white count and inflammatory markers although no real source for infection.  Fevers come down here now.   [MB]  1222 Blood pressure little soft but looks like she tends to run in the low end.   [MB]  1332 Patient was able to get up and walk to the bathroom.  She said she feels a little bit better but she does not feel like she should be able to go home.  Just feels too weak still.   [MB]  1400 Discussed with Dr. Lowell GuitarPowell from the hospitalist service who will evaluate the patient for admission.   [MB]    Clinical Course User Index [MB] Terrilee FilesButler, Shakai Dolley C, MD   Jody Heritageiana H Lucado was evaluated in Emergency Department on 11/21/2018 for the symptoms described in the history of present illness. She was evaluated in the context of the global COVID-19  pandemic, which necessitated consideration that the patient might be at risk for infection with the SARS-CoV-2 virus that causes COVID-19. Institutional protocols and algorithms that pertain to the evaluation of patients at risk for COVID-19 are in a state of rapid change based on information released by regulatory bodies including the CDC and federal and state organizations. These policies and algorithms were followed during the patient's care in the ED.      Final Clinical Impressions(s) / ED Diagnoses   Final diagnoses:  Fever and chills  Hypokalemia  Weakness    ED Discharge Orders    None       Terrilee FilesButler, Rolf Fells C, MD 11/21/18 [phone]

## 2018-11-21 NOTE — Progress Notes (Deleted)
History and Physical    Jody Mcclure:[id number] DOB: 1955-05-07 DOA: 11/21/2018  PCP: Thom Chimes, MD  Patient coming from: home  I have personally briefly reviewed patient's old medical records in [facility]  Chief Complaint: malaise  HPI: Jody Mcclure is Jody Mcclure 63 y.o. female with medical history significant of anxiety, depression, HTN, HLD presenting with general malaise over the past week.  She notes that she started have subjective fever and chills.  She also just noticed shaking chills.  Her symptoms have been present over the past week, but have gotten worse over the past day or so.  She denies cough, SOB, CP, abdominal pain, dysuria or UTI symptoms.  She denies sick contacts.  Denies smoking.  She does drink alcohol.  Also hx of cocaine use.  ED Course: Labs, imaging.  Admit for elevated inflammatory markers, concern for infection.    Review of Systems: As per HPI otherwise 10 point review of systems negative.    Past Medical History:  Diagnosis Date   Anxiety    Depression    Hyperlipidemia    Hypertension     Past Surgical History:  Procedure Laterality Date   ABDOMINAL HYSTERECTOMY     TONSILLECTOMY       reports that she quit smoking about 10 months ago. Her smoking use included cigarettes. She smoked 0.50 packs per day. She has never used smokeless tobacco. She reports current alcohol use of about 2.0 standard drinks of alcohol per week. She reports current drug use. Drug: Cocaine.  Allergies  Allergen Reactions   Codeine Nausea And Vomiting   Sulfa Antibiotics Rash    Family History  Problem Relation Age of Onset   Cancer Mother        lung   Hyperlipidemia Mother    Hypertension Father    Hyperlipidemia Father    Colon cancer Neg Hx    Colon polyps Neg Hx    Rectal cancer Neg Hx    Stomach cancer Neg Hx     Prior to Admission medications   Medication Sig Start Date End Date Taking? Authorizing Provider  amLODipine (NORVASC)  5 MG tablet TAKE 1 TABLET BY MOUTH EVERY DAY Patient taking differently: Take 5 mg by mouth daily.  08/20/18  Yes Hoffman, Jessica Ratliff, DO  betamethasone valerate ointment (VALISONE) 0.1 % APPLY 1 APPLICATION TOPICALLY 2 (TWO) TIMES DAILY. 10/04/18  Yes Hoffman, Bary Richard, DO  Incontinence Supply Disposable (DEPEND EASY FIT UNDERGARMENTS) MISC 1 Units by Does not apply route 3 (three) times daily. 03/30/18  Yes Hoffman, Jessica Ratliff, DO  sertraline (ZOLOFT) 50 MG tablet TAKE 3 TABLETS BY MOUTH EVERY DAY Patient taking differently: Take 150 mg by mouth daily.  09/18/18  Yes Tyson Alias, MD  Tiotropium Bromide-Olodaterol (STIOLTO RESPIMAT) 2.5-2.5 MCG/ACT AERS Inhale 2 puffs into the lungs daily. 08/15/18  Yes Hoffman, Jessica Ratliff, DO  cyclobenzaprine (FLEXERIL) 5 MG tablet TAKE 1 TABLET BY MOUTH THREE TIMES Zackeriah Kissler DAY AS NEEDED FOR MUSCLE SPASMS Patient not taking: Reported on 12/28/2017 10/11/17   Levert Feinstein, MD  hydrOXYzine (ATARAX/VISTARIL) 10 MG tablet TAKE 1 TABLET BY MOUTH THREE TIMES Nori Poland DAY AS NEEDED Patient not taking: Reported on 12/28/2017 10/11/17   Levert Feinstein, MD  predniSONE (STERAPRED UNI-PAK 21 TAB) 10 MG (21) TBPK tablet Take by mouth daily. Take 6 tabs by mouth daily  for 1 days, then 5 tabs for 2 days, then 4 tabs for 2 days, then 3 tabs  for 2 days, 2 tabs for 2 days, then 1 tab by mouth daily for 2 days Patient not taking: Reported on 08/23/2018 02/05/18   Delia Heady, PA-C  senna-docusate (SENOKOT-S) 8.6-50 MG tablet Take 1 tablet by mouth daily. Patient not taking: Reported on 12/28/2017 11/15/17   Kalman Shan Ratliff, DO  omeprazole (PRILOSEC OTC) 20 MG tablet Take 1 tablet (20 mg total) by mouth daily. 12/13/12 04/09/13  Donnamae Jude, MD    Physical Exam: Vitals:   11/21/18 1156 11/21/18 1500 11/21/18 1530 11/21/18 1600  BP: 100/63 101/79  107/67  Pulse: (!) 59 71 85 71  Resp: 18   19  Temp: 98 F (36.7 C)     TempSrc: Oral     SpO2:  100% 99% 98% 100%  Weight:      Height:        Constitutional: NAD, calm, comfortable Vitals:   11/21/18 1156 11/21/18 1500 11/21/18 1530 11/21/18 1600  BP: 100/63 101/79  107/67  Pulse: (!) 59 71 85 71  Resp: 18   19  Temp: 98 F (36.7 C)     TempSrc: Oral     SpO2: 100% 99% 98% 100%  Weight:      Height:       Eyes: PERRL, lids and conjunctivae normal ENMT: Mucous membranes are moist. Posterior pharynx clear of any exudate or lesions.Normal dentition.  Neck: normal, supple, no masses, no thyromegaly Respiratory: clear to auscultation bilaterally, no wheezing, no crackles.  Cardiovascular: Regular rate and rhythm, no murmurs / rubs / gallops. No extremity edema. 2+ pedal pulses.  Abdomen: no tenderness, no masses palpated. No hepatosplenomegaly. No CVA tenderness. Musculoskeletal: no clubbing / cyanosis. No joint deformity upper and lower extremities. Good ROM, no contractures. Normal muscle tone.  Skin: no rashes, lesions, ulcers. No induration Neurologic: CN 2-12 grossly intact. Sensation intact. Moving all extremities. Psychiatric: Normal judgment and insight. Alert and oriented x 3. Normal mood.   Labs on Admission: I have personally reviewed following labs and imaging studies  CBC: Recent Labs  Lab 11/21/18 0838  WBC 15.6*  NEUTROABS 12.4*  HGB 11.9*  HCT 35.9*  MCV 90.9  PLT 384   Basic Metabolic Panel: Recent Labs  Lab 11/21/18 0838  NA 135  K 2.7*  CL 101  CO2 22  GLUCOSE 134*  BUN 10  CREATININE 0.94  CALCIUM 8.3*  MG 1.7   GFR: Estimated Creatinine Clearance: 44.6 mL/min (by C-G formula based on SCr of 0.94 mg/dL). Liver Function Tests: Recent Labs  Lab 11/21/18 0838  AST 51*  ALT 51*  ALKPHOS 107  BILITOT 1.3*  PROT 7.7  ALBUMIN 3.2*   No results for input(s): LIPASE, AMYLASE in the last 168 hours. No results for input(s): AMMONIA in the last 168 hours. Coagulation Profile: No results for input(s): INR, PROTIME in the last 168  hours. Cardiac Enzymes: No results for input(s): CKTOTAL, CKMB, CKMBINDEX, TROPONINI in the last 168 hours. BNP (last 3 results) No results for input(s): PROBNP in the last 8760 hours. HbA1C: No results for input(s): HGBA1C in the last 72 hours. CBG: No results for input(s): GLUCAP in the last 168 hours. Lipid Profile: Recent Labs    11/21/18 0838  TRIG 150*   Thyroid Function Tests: No results for input(s): TSH, T4TOTAL, FREET4, T3FREE, THYROIDAB in the last 72 hours. Anemia Panel: Recent Labs    11/21/18 0838  FERRITIN 277   Urine analysis:    Component Value Date/Time   COLORURINE  YELLOW 02/23/2013 1058   APPEARANCEUR CLEAR 02/23/2013 1058   LABSPEC 1.017 02/23/2013 1058   PHURINE 5.5 02/23/2013 1058   GLUCOSEU NEGATIVE 02/23/2013 1058   HGBUR TRACE ([place]) 02/23/2013 1058   BILIRUBINUR NEGATIVE 02/23/2013 1058   KETONESUR NEGATIVE 02/23/2013 1058   PROTEINUR NEGATIVE 02/23/2013 1058   UROBILINOGEN 0.2 02/23/2013 1058   NITRITE NEGATIVE 02/23/2013 1058   LEUKOCYTESUR NEGATIVE 02/23/2013 1058    Radiological Exams on Admission: Ct Angio Chest Pe W Or Wo Contrast  Result Date: 11/21/2018 CLINICAL DATA:  Fever of unknown origin. Dyspnea. Generalized weakness. Nausea and vomiting. EXAM: CT ANGIOGRAPHY CHEST CT ABDOMEN AND PELVIS WITH CONTRAST TECHNIQUE: Multidetector CT imaging of the chest was performed using the standard protocol during bolus administration of intravenous contrast. Multiplanar CT image reconstructions and MIPs were obtained to evaluate the vascular anatomy. Multidetector CT imaging of the abdomen and pelvis was performed using the standard protocol during bolus administration of intravenous contrast. CONTRAST:  100mL OMNIPAQUE IOHEXOL 350 MG/ML SOLN COMPARISON:  08/06/2009 CT chest, abdomen and pelvis. FINDINGS: CTA CHEST FINDINGS Cardiovascular: The study is low to moderate quality for the evaluation of pulmonary embolism, degraded by motion. There are no  convincing filling defects in the central, lobar, segmental or subsegmental pulmonary artery branches to suggest acute pulmonary embolism. Atherosclerotic nonaneurysmal thoracic aorta. Normal caliber main pulmonary artery. Normal heart size. No significant pericardial fluid/thickening. Left anterior descending and left circumflex coronary atherosclerosis. Mediastinum/Nodes: No discrete thyroid nodules. Unremarkable esophagus. No pathologically enlarged axillary, mediastinal or hilar lymph nodes. Lungs/Pleura: No pneumothorax. No pleural effusion. No acute consolidative airspace disease, lung masses or significant pulmonary nodules. Musculoskeletal: No aggressive appearing focal osseous lesions. Mild T6 vertebral compression fracture is new since 2011 CT, of indeterminate chronicity. Moderate thoracic spondylosis. Review of the MIP images confirms the above findings. CT ABDOMEN and PELVIS FINDINGS Hepatobiliary: Normal liver size. Hypodense right liver dome 1.1 cm lesion (series 11/image 16), stable in size since 08/06/2009 CT, considered benign. Progressively enhancing 1.3 cm inferior right liver lesion (series 16/image 25), stable in size since 2011 CT, compatible with Den[place] Start benign hemangioma. Subcentimeter hypodense segment 3 left liver lobe lesion is too small to characterize, not definitely seen on prior CT, for which no follow-up is required unless the patient has risk factors for liver malignancy. Scattered granulomatous liver calcifications. Cholelithiasis. No gallbladder wall thickening or pericholecystic fluid. No biliary ductal dilatation. Pancreas: Normal, with no mass or duct dilation. Spleen: Normal size. No mass. Adrenals/Urinary Tract: Normal adrenals. Patchy bilateral contrast nephrograms with hazy perinephric fat, compatible with acute bilateral pyelonephritis. No renal masses or perinephric collections. No hydronephrosis. Normal bladder. Stomach/Bowel: Small hiatal hernia. Otherwise normal nondistended  stomach. Normal caliber small bowel with no small bowel wall thickening. Normal appendix. Normal large bowel with no diverticulosis, large bowel wall thickening or pericolonic fat stranding. Vascular/Lymphatic: Atherosclerotic nonaneurysmal abdominal aorta. Patent portal, splenic, hepatic and renal veins. No pathologically enlarged lymph nodes in the abdomen or pelvis. Reproductive: Status post hysterectomy, with no abnormal findings at the vaginal cuff. Cystic 3.0 x 2.8 cm right adnexal lesion with suggestion of thin internal septation (series 11/image 65), which may represent growth of previously visualized 2.3 x 1.7 cm right adnexal cystic lesion on 2011 CT. No left adnexal mass. Other: No pneumoperitoneum, ascites or focal fluid collection. Musculoskeletal: No aggressive appearing focal osseous lesions. Marked lumbar spondylosis and facet arthropathy with chronic 9 mm anterolisthesis at L4-5. Review of the MIP images confirms the above findings. IMPRESSION: CT CHEST: 1.  Limited motion degraded scan. No evidence of pulmonary embolism. No active pulmonary disease. 2. Two vessel coronary atherosclerosis. 3. Mild T6 vertebral compression fracture, new since 2011 CT, of indeterminate chronicity, chronic appearing. CT ABDOMEN / PELVIS: 1. CT findings suggest acute bilateral pyelonephritis. No evidence of renal abscess. No hydronephrosis. 2. Septated 3.0 cm right adnexal cystic lesion, mildly increased from 2011 CT. Suggest nonemergent outpatient pelvic ultrasound for further evaluation. 3. Cholelithiasis. 4. Small hiatal hernia. 5.  Aortic Atherosclerosis (ICD10-I70.0). Electronically Signed   By: Delbert PhenixJason Andre Gallego Poff M.D.   On: 11/21/2018 17:23   Ct Abdomen Pelvis W Contrast  Result Date: 11/21/2018 CLINICAL DATA:  Fever of unknown origin. Dyspnea. Generalized weakness. Nausea and vomiting. EXAM: CT ANGIOGRAPHY CHEST CT ABDOMEN AND PELVIS WITH CONTRAST TECHNIQUE: Multidetector CT imaging of the chest was performed using  the standard protocol during bolus administration of intravenous contrast. Multiplanar CT image reconstructions and MIPs were obtained to evaluate the vascular anatomy. Multidetector CT imaging of the abdomen and pelvis was performed using the standard protocol during bolus administration of intravenous contrast. CONTRAST:  100mL OMNIPAQUE IOHEXOL 350 MG/ML SOLN COMPARISON:  08/06/2009 CT chest, abdomen and pelvis. FINDINGS: CTA CHEST FINDINGS Cardiovascular: The study is low to moderate quality for the evaluation of pulmonary embolism, degraded by motion. There are no convincing filling defects in the central, lobar, segmental or subsegmental pulmonary artery branches to suggest acute pulmonary embolism. Atherosclerotic nonaneurysmal thoracic aorta. Normal caliber main pulmonary artery. Normal heart size. No significant pericardial fluid/thickening. Left anterior descending and left circumflex coronary atherosclerosis. Mediastinum/Nodes: No discrete thyroid nodules. Unremarkable esophagus. No pathologically enlarged axillary, mediastinal or hilar lymph nodes. Lungs/Pleura: No pneumothorax. No pleural effusion. No acute consolidative airspace disease, lung masses or significant pulmonary nodules. Musculoskeletal: No aggressive appearing focal osseous lesions. Mild T6 vertebral compression fracture is new since 2011 CT, of indeterminate chronicity. Moderate thoracic spondylosis. Review of the MIP images confirms the above findings. CT ABDOMEN and PELVIS FINDINGS Hepatobiliary: Normal liver size. Hypodense right liver dome 1.1 cm lesion (series 11/image 16), stable in size since 08/06/2009 CT, considered benign. Progressively enhancing 1.3 cm inferior right liver lesion (series 16/image 25), stable in size since 2011 CT, compatible with [place] benign hemangioma. Subcentimeter hypodense segment 3 left liver lobe lesion is too small to characterize, not definitely seen on prior CT, for which no follow-up is required unless  the patient has risk factors for liver malignancy. Scattered granulomatous liver calcifications. Cholelithiasis. No gallbladder wall thickening or pericholecystic fluid. No biliary ductal dilatation. Pancreas: Normal, with no mass or duct dilation. Spleen: Normal size. No mass. Adrenals/Urinary Tract: Normal adrenals. Patchy bilateral contrast nephrograms with hazy perinephric fat, compatible with acute bilateral pyelonephritis. No renal masses or perinephric collections. No hydronephrosis. Normal bladder. Stomach/Bowel: Small hiatal hernia. Otherwise normal nondistended stomach. Normal caliber small bowel with no small bowel wall thickening. Normal appendix. Normal large bowel with no diverticulosis, large bowel wall thickening or pericolonic fat stranding. Vascular/Lymphatic: Atherosclerotic nonaneurysmal abdominal aorta. Patent portal, splenic, hepatic and renal veins. No pathologically enlarged lymph nodes in the abdomen or pelvis. Reproductive: Status post hysterectomy, with no abnormal findings at the vaginal cuff. Cystic 3.0 x 2.8 cm right adnexal lesion with suggestion of thin internal septation (series 11/image 65), which may represent growth of previously visualized 2.3 x 1.7 cm right adnexal cystic lesion on 2011 CT. No left adnexal mass. Other: No pneumoperitoneum, ascites or focal fluid collection. Musculoskeletal: No aggressive appearing focal osseous lesions. Marked lumbar spondylosis and facet arthropathy with  chronic 9 mm anterolisthesis at L4-5. Review of the MIP images confirms the above findings. IMPRESSION: CT CHEST: 1. Limited motion degraded scan. No evidence of pulmonary embolism. No active pulmonary disease. 2. Two vessel coronary atherosclerosis. 3. Mild T6 vertebral compression fracture, new since 2011 CT, of indeterminate chronicity, chronic appearing. CT ABDOMEN / PELVIS: 1. CT findings suggest acute bilateral pyelonephritis. No evidence of renal abscess. No hydronephrosis. 2. Septated  3.0 cm right adnexal cystic lesion, mildly increased from 2011 CT. Suggest nonemergent outpatient pelvic ultrasound for further evaluation. 3. Cholelithiasis. 4. Small hiatal hernia. 5.  Aortic Atherosclerosis (ICD10-I70.0). Electronically Signed   By: Delbert PhenixJason Kortne All Poff M.D.   On: 11/21/2018 17:23   Dg Chest Port 1 View  Result Date: 11/21/2018 CLINICAL DATA:  Fever and chills for 1 week. EXAM: PORTABLE CHEST 1 VIEW COMPARISON:  October 29, 2018 FINDINGS: The heart size and mediastinal contours are within normal limits. Both lungs are clear. The visualized skeletal structures are stable. 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 IMPRESSION: No acute cardiopulmonary disease identified. Electronically Signed   By: Sherian ReinWei-Chen  Lin M.D.   On: 11/21/2018 08:55    EKG: Independently reviewed. Sinus rhythm, prolonged QTc.  Wandering baseline.  Poor quality EKG.  Assessment/Plan Active Problems:   Sepsis (HCC)  Sepsis 2/2 Acute Bilateral Pyelonephritis: Pt without clear UTI symptoms, but imaging suggestive of acute bilateral pyelonephritis.  UA and culture collected, but pending.  Her symptoms were pretty nonspecific, but imaging suggests UTI. Negative COVID 19 testing CTA without evidence of pneumonia or PE (limited study) Blood cx pending Vanc/cefepime -> narrow to cefepime based on imaging findings - narrow as able  Hypokalemia: replace and follow.  Follow magnesium  Elevated LFTs: likely 2/2 sepsis above.  Follow acute hepatitis panel.  Elevated inflammatory markers: elevated CRP, fibrinogen, d dimer - likely 2/2 sepsis above - of note, CTA without evidence of PE (poor quality study).  Consider LE US, but suspect elevated d dimer related to sepsis with no exam findings concerning for LE DVT.  Hypertension: hold amlodipine with sepsis  Depression   anxiety: continue zoloft  ?COPD: continue stiolto (pharmacy alternative).  No chart hx of this.  Needs outpatient follow  up.  Abnormal EKG: Poor quality EKG with wandering baseline, repeat in AM (poor baseline, prolonged QTc), no CP or SOB or sx concerning for ACS.    Septated 3.0 cm R adnexal cystic lesion: increased from 2011 CT, needs nonemergent outpatient pelvic ultrasound  Mild T6 Vertebral Compression fracture: indeterminate chronicity, follow outpatient, needs outpatient f/u and treatment of osteoporosis  DVT prophylaxis: lovenox  Code Status: full, discussed with pt  Family Communication: none at bedside, pt to update sig other  Disposition Plan: pending further improvement, culture results - inpatient due to sepsis and risk of decompensation with acute bilateral pyelonephritis Consults called: none Admission status: tele    Lacretia Nicksaldwell Powell MD [facility] Pager AMION  If 7PM-7AM, please contact night-coverage [email or web address] Password [email or web address]  11/21/2018, 6:19 PM

## 2018-11-21 NOTE — Progress Notes (Signed)
Pharmacy Antibiotic Note  Jody Mcclure is a 63 y.o. female admitted on 11/21/2018 with sepsis.  Pharmacy has been consulted for vancomycin and cefepime dosing. 8/12 CXR: clear, Temp 102.4, WBC 15.6, SCr 0.94, PCT 2.34, CRP 20.7  Plan: Vancomycin 1250 mg IV loading dose Vancomycin 750 mg IV Q 24 hrs. Goal AUC 400-550. Expected AUC: 462.6  SCr used: 0.94, IBW Cefepime 2 gm IV q12 F/u renal function, WBC, temp, culture data Vancomycin levels as needed  Height: 5' (152.4 cm) Weight: 120 lb (54.4 kg) IBW/kg (Calculated) : 45.5  Temp (24hrs), Avg:100.2 F (37.9 C), Min:98 F (36.7 C), Max:102.4 F (39.1 C)  Recent Labs  Lab 11/21/18 0838  WBC 15.6*  CREATININE 0.94  LATICACIDVEN 0.8    Estimated Creatinine Clearance: 44.6 mL/min (by C-G formula based on SCr of 0.94 mg/dL).    Allergies  Allergen Reactions  . Codeine Nausea And Vomiting  . Sulfa Antibiotics Rash    Antimicrobials this admission: 8/12 vanc>> 8/12 cefepime>> Dose adjustments this admission:  Microbiology results: 8/12 HIV:  8/12 Ucx:  8/12 BCx2: sent 8/12 Covid: negative  Thank you for allowing pharmacy to be a part of this patient's care.  Eudelia Bunch, Pharm.D [phone] 11/21/2018 2:42 PM

## 2018-11-22 LAB — BLOOD CULTURE ID PANEL (REFLEXED)

## 2018-11-22 LAB — URINALYSIS, ROUTINE W REFLEX MICROSCOPIC
Bilirubin Urine: NEGATIVE
Glucose, UA: NEGATIVE mg/dL
Ketones, ur: NEGATIVE mg/dL
Leukocytes,Ua: NEGATIVE
Nitrite: NEGATIVE
Protein, ur: NEGATIVE mg/dL
Specific Gravity, Urine: 1.008 (ref 1.005–1.030)
pH: 5 (ref 5.0–8.0)

## 2018-11-22 LAB — COMPREHENSIVE METABOLIC PANEL
ALT: 39 U/L (ref 0–44)
AST: 29 U/L (ref 15–41)
Albumin: 2.7 g/dL — ABNORMAL LOW (ref 3.5–5.0)
Alkaline Phosphatase: 92 U/L (ref 38–126)
Anion gap: 6 (ref 5–15)
BUN: 8 mg/dL (ref 8–23)
CO2: 21 mmol/L — ABNORMAL LOW (ref 22–32)
Calcium: 7.8 mg/dL — ABNORMAL LOW (ref 8.9–10.3)
Chloride: 112 mmol/L — ABNORMAL HIGH (ref 98–111)
Creatinine, Ser: 0.79 mg/dL (ref 0.44–1.00)
GFR calc Af Amer: >60 mL/min (ref >60)
GFR calc non Af Amer: >60 mL/min (ref >60)
Glucose, Bld: 117 mg/dL — ABNORMAL HIGH (ref 70–99)
Potassium: 3.5 mmol/L (ref 3.5–5.1)
Sodium: 139 mmol/L (ref 135–145)
Total Bilirubin: 0.7 mg/dL (ref 0.3–1.2)
Total Protein: 6.7 g/dL (ref 6.5–8.1)

## 2018-11-22 LAB — CBC
HCT: 31.5 % — ABNORMAL LOW (ref 36.0–46.0)
Hemoglobin: 9.7 g/dL — ABNORMAL LOW (ref 12.0–15.0)
MCH: 29.4 pg (ref 26.0–34.0)
MCHC: 30.8 g/dL (ref 30.0–36.0)
MCV: 95.5 fL (ref 80.0–100.0)
Platelets: 264 10*3/uL (ref 150–400)
RBC: 3.3 MIL/uL — ABNORMAL LOW (ref 3.87–5.11)
RDW: 14.8 % (ref 11.5–15.5)
WBC: 12.7 10*3/uL — ABNORMAL HIGH (ref 4.0–10.5)
nRBC: 0 % (ref 0.0–0.2)

## 2018-11-22 MED ORDER — SODIUM CHLORIDE 0.9 % IV SOLN
2.0000 g | INTRAVENOUS | Status: DC
  Administered 2018-11-22 – 2018-11-24 (×3): 2 g via INTRAVENOUS
  Filled 2018-11-22 (×3): qty 2

## 2018-11-22 MED ORDER — SODIUM CHLORIDE 0.9 % IV SOLN
1000.0000 mL | INTRAVENOUS | Status: DC
  Administered 2018-11-23: 1000 mL via INTRAVENOUS

## 2018-11-22 MED ORDER — IPRATROPIUM-ALBUTEROL 0.5-2.5 (3) MG/3ML IN SOLN
3.0000 mL | Freq: Four times a day (QID) | RESPIRATORY_TRACT | Status: DC | PRN
  Administered 2018-11-22: 3 mL via RESPIRATORY_TRACT
  Filled 2018-11-22: qty 3

## 2018-11-22 MED ORDER — TRAZODONE HCL 50 MG PO TABS
25.0000 mg | ORAL_TABLET | Freq: Every day | ORAL | Status: DC
  Administered 2018-11-22 – 2018-11-24 (×3): 25 mg via ORAL
  Filled 2018-11-22 (×3): qty 1

## 2018-11-22 NOTE — Progress Notes (Signed)
   11/22/18 1555  Vitals  BP (!) 89/37  Pulse Rate 83  Oxygen Therapy  SpO2 97 %  O2 Device Room Air  131/77 - BP rechecked BP.  Dr. Nevada Crane notified of VS and patient c/o of upper abdominal tightness - patient reports it is "hard to take a deep breath."

## 2018-11-22 NOTE — Progress Notes (Signed)
Patient reports tightness is "easing up some".  Patient states she can take a deep breath now.  O2 sats 96-97% on RA.

## 2018-11-22 NOTE — Progress Notes (Signed)
PROGRESS NOTE  Jody Mcclure OFB:[id number] DOB: 04/22/1955 DOA: 11/21/2018 PCP: Ladona Horns, MD  HPI/Recap of past 24 hours: Jody Mcclure is a 63 y.o. female with medical history significant of anxiety, depression, HTN, HLD presenting with general malaise over the past week.  She notes that she started have subjective fever and chills.  She also just noticed shaking chills.  Her symptoms have been present over the past week, but have gotten worse over the past day or so.  She denies cough, SOB, CP, abdominal pain, dysuria or UTI symptoms.  She denies sick contacts.  Denies smoking.  She does drink alcohol.  Also hx of cocaine use.    Admitted for sepsis secondary to acute bilateral pyelonephritis.  11/22/18: Patient was seen and examined her her bedside this morning.  She reports moderate bilateral flank pain, chills and night sweats.  Nausea is improved with antiemetics.    Assessment/Plan: Active Problems:   Sepsis ([place])   Fever and chills  Sepsis secondary to acute bilateral pyelonephritis and E. coli bacteremia Presented with fever with T-max 102.4, leukocytosis with WBC 15K, tachypnea with respiration rate of 27 and generalized weakness. CTA PE, CT abdomen and pelvis with contrast showed bilateral hydronephrosis, UA with pyuria, urine culture in process Blood cultures drawn on 11/21/2018 grew E. coli 1 out of 2 bottles Initially on cefepime, changed to Rocephin 2 g daily by pharmacy Awaiting sensitivities Repeat blood culture peripherally x2 on 11/23/2018 Monitor fever curve and WBC Optimize pain control  E. coli bacteremia Management as stated above Urine culture obtained this morning in process  Resolving hypokalemia post repletion  History of hypertension Blood pressure is currently soft Maintain map greater than 65 Hold off antihypertensive in the setting of sepsis  Normocytic anemia with drop in hemoglobin Baseline hemoglobin appears to be 14 K Presented with  hemoglobin of 11.9 Hemoglobin dropped to 9.7 on 11/22/2018, suspect dilutional in the setting of IV fluid hydration    Risks: Patient is at high risk for decompensation due to sepsis secondary to E. coli bacteremia, bilateral pyelonephritis and advanced age.  Patient will require least 2 midnights for further evaluation and treatment of present condition.   DVT prophylaxis: lovenox subcu daily Code Status: full Family Communication: none at bedside, pt to update sig other  Disposition Plan:  Possible DC to home in 2 days when repeat blood cultures are negative and the patient is hemodynamically stable.  Consults called: none   Objective: Vitals:   11/21/18 2238 11/22/18 0500 11/22/18 0529 11/22/18 0814  BP:   110/67   Pulse:   83   Resp:   18   Temp: 99.1 F (37.3 C)  100 F (37.8 C)   TempSrc: Oral  Oral   SpO2:   99% 98%  Weight:  57.3 kg    Height:        Intake/Output Summary (Last 24 hours) at 11/22/2018 1236 Last data filed at 11/22/2018 1024 Gross per 24 hour  Intake 3160 ml  Output 2076 ml  Net 1084 ml   Filed Weights   11/21/18 0725 11/21/18 1821 11/22/18 0500  Weight: 54.4 kg 58.1 kg 57.3 kg    Exam:   General: 63 y.o. year-old female well developed well nourished in no acute distress.  Alert and oriented x3.  Cardiovascular: Regular rate and rhythm with no rubs or gallops.  No thyromegaly or JVD noted.    Respiratory: Clear to auscultation with no wheezes or rales. Good inspiratory  effort.  Abdomen: Soft nontender nondistended with normal bowel sounds x4 quadrants.  Musculoskeletal: No lower extremity edema. 2/4 pulses in all 4 extremities.  Psychiatry: Mood is appropriate for condition and setting   Data Reviewed: CBC: Recent Labs  Lab 11/21/18 0838 11/21/18 1932 11/22/18 0342  WBC 15.6* 11.8* 12.7*  NEUTROABS 12.4*  --   --   HGB 11.9* 9.4* 9.7*  HCT 35.9* 29.7* 31.5*  MCV 90.9 92.8 95.5  PLT 278 226 264   Basic Metabolic  Panel: Recent Labs  Lab 11/21/18 0838 11/21/18 1932 11/22/18 0342  NA 135  --  139  K 2.7*  --  3.5  CL 101  --  112*  CO2 22  --  21*  GLUCOSE 134*  --  117*  BUN 10  --  8  CREATININE 0.94 0.82 0.79  CALCIUM 8.3*  --  7.8*  MG 1.7  --   --    GFR: Estimated Creatinine Clearance: 57.8 mL/min (by C-G formula based on SCr of 0.79 mg/dL). Liver Function Tests: Recent Labs  Lab 11/21/18 0838 11/22/18 0342  AST 51* 29  ALT 51* 39  ALKPHOS 107 92  BILITOT 1.3* 0.7  PROT 7.7 6.7  ALBUMIN 3.2* 2.7*   No results for input(s): LIPASE, AMYLASE in the last 168 hours. No results for input(s): AMMONIA in the last 168 hours. Coagulation Profile: No results for input(s): INR, PROTIME in the last 168 hours. Cardiac Enzymes: No results for input(s): CKTOTAL, CKMB, CKMBINDEX, TROPONINI in the last 168 hours. BNP (last 3 results) No results for input(s): PROBNP in the last 8760 hours. HbA1C: No results for input(s): HGBA1C in the last 72 hours. CBG: No results for input(s): GLUCAP in the last 168 hours. Lipid Profile: Recent Labs    11/21/18 0838  TRIG 150*   Thyroid Function Tests: No results for input(s): TSH, T4TOTAL, FREET4, T3FREE, THYROIDAB in the last 72 hours. Anemia Panel: Recent Labs    11/21/18 0838  FERRITIN 277   Urine analysis:    Component Value Date/Time   COLORURINE YELLOW 11/22/2018 0617   APPEARANCEUR CLEAR 11/22/2018 0617   LABSPEC 1.008 11/22/2018 0617   PHURINE 5.0 11/22/2018 0617   GLUCOSEU NEGATIVE 11/22/2018 0617   HGBUR SMALL (A) 11/22/2018 0617   BILIRUBINUR NEGATIVE 11/22/2018 0617   KETONESUR NEGATIVE 11/22/2018 0617   PROTEINUR NEGATIVE 11/22/2018 0617   UROBILINOGEN 0.2 02/23/2013 1058   NITRITE NEGATIVE 11/22/2018 0617   LEUKOCYTESUR NEGATIVE 11/22/2018 0617   Sepsis Labs: @LABRCNTIP (procalcitonin:4,lacticidven:4)  ) Recent Results (from the past 240 hour(s))  SARS Coronavirus 2 [facility] order, Performed in [facility]  hospital lab) Nasopharyngeal Nasopharyngeal Swab     Status: None   Collection Time: 11/21/18  8:09 AM   Specimen: Nasopharyngeal Swab  Result Value Ref Range Status   SARS Coronavirus 2 NEGATIVE NEGATIVE Final    Comment: (NOTE) If result is NEGATIVE SARS-CoV-2 target nucleic acids are NOT DETECTED. The SARS-CoV-2 RNA is generally detectable in upper and lower  respiratory specimens during the acute phase of infection. The lowest  concentration of SARS-CoV-2 viral copies this assay can detect is 250  copies / mL. A negative result does not preclude SARS-CoV-2 infection  and should not be used as the sole basis for treatment or other  patient management decisions.  A negative result may occur with  improper specimen collection / handling, submission of specimen other  than nasopharyngeal swab, presence of viral mutation(s) within the  areas targeted by this  assay, and inadequate number of viral copies  (<250 copies / mL). A negative result must be combined with clinical  observations, patient history, and epidemiological information. If result is POSITIVE SARS-CoV-2 target nucleic acids are DETECTED. The SARS-CoV-2 RNA is generally detectable in upper and lower  respiratory specimens dur ing the acute phase of infection.  Positive  results are indicative of active infection with SARS-CoV-2.  Clinical  correlation with patient history and other diagnostic information is  necessary to determine patient infection status.  Positive results do  not rule out bacterial infection or co-infection with other viruses. If result is PRESUMPTIVE POSTIVE SARS-CoV-2 nucleic acids MAY BE PRESENT.   A presumptive positive result was obtained on the submitted specimen  and confirmed on repeat testing.  While 2019 novel coronavirus  (SARS-CoV-2) nucleic acids may be present in the submitted sample  additional confirmatory testing may be necessary for epidemiological  and / or clinical management  purposes  to differentiate between  SARS-CoV-2 and other Sarbecovirus currently known to infect humans.  If clinically indicated additional testing with an alternate test  methodology [phone] is advised. The SARS-CoV-2 RNA is generally  detectable in upper and lower respiratory sp ecimens during the acute  phase of infection. The expected result is Negative. Fact Sheet for Patients:  BoilerBrush.com.cyhttps://www.fda.gov/media/136312/download Fact Sheet for Healthcare Providers: https://pope.com/https://www.fda.gov/media/136313/download This test is not yet approved or cleared by the [place] FDA and has been authorized for detection and/or diagnosis of SARS-CoV-2 by FDA under an Emergency Use Authorization (EUA).  This EUA will remain in effect (meaning this test can be used) for the duration of the COVID-19 declaration under Section 564(b)(1) of the Act, 21 U.S.C. section 360bbb-3(b)(1), unless the authorization is terminated or revoked sooner. Performed at [facility], [address] [place], [place] [address]   Blood Culture (routine x 2)     Status: None (Preliminary result)   Collection Time: 11/21/18  8:38 AM   Specimen: BLOOD  Result Value Ref Range Status   Specimen Description   Final    BLOOD LEFT ANTECUBITAL Performed at [facility], [address] [place], [place] [address]    Special Requests   Final    BOTTLES DRAWN AEROBIC AND ANAEROBIC Blood Culture results may not be optimal due to an excessive volume of blood received in culture bottles Performed at [facility], [address] [place], [place] [address]    Culture  Setup Time   Final    GRAM NEGATIVE RODS AEROBIC BOTTLE ONLY CRITICAL RESULT CALLED TO, READ BACK BY AND VERIFIED WITH: Tamala BariD. WOFFORD PHARMD, AT [phone] 11/22/18 BY Renato Shin. VANHOOK Performed at [facility], [address] [place], [place] [address]    Culture GRAM NEGATIVE RODS  Final   Report Status PENDING   Incomplete  Blood Culture ID Panel (Reflexed)     Status: Abnormal   Collection Time: 11/21/18  8:38 AM  Result Value Ref Range Status   Enterococcus species NOT DETECTED NOT DETECTED Final   Listeria monocytogenes NOT DETECTED NOT DETECTED Final   Staphylococcus species NOT DETECTED NOT DETECTED Final   Staphylococcus aureus (BCID) NOT DETECTED NOT DETECTED Final   Streptococcus species NOT DETECTED NOT DETECTED Final   Streptococcus agalactiae NOT DETECTED NOT DETECTED Final   Streptococcus pneumoniae NOT DETECTED NOT DETECTED Final   Streptococcus pyogenes NOT DETECTED NOT DETECTED Final   Acinetobacter baumannii NOT DETECTED NOT DETECTED Final   Enterobacteriaceae species DETECTED (  A) NOT DETECTED Final    Comment: Enterobacteriaceae represent a large family of gram-negative bacteria, not a single organism. CRITICAL RESULT CALLED TO, READ BACK BY AND VERIFIED WITH: D. WOFFORD PHARMD, AT [id number] 11/22/18 BY D. VANHOOK    Enterobacter cloacae complex NOT DETECTED NOT DETECTED Final   Escherichia coli DETECTED (A) NOT DETECTED Final    Comment: CRITICAL RESULT CALLED TO, READ BACK BY AND VERIFIED WITH: D. WOFFORD PHARMD, AT [phone] 11/22/18 BY D. VANHOOK    Klebsiella oxytoca NOT DETECTED NOT DETECTED Final   Klebsiella pneumoniae NOT DETECTED NOT DETECTED Final   Proteus species NOT DETECTED NOT DETECTED Final   Serratia marcescens NOT DETECTED NOT DETECTED Final   Carbapenem resistance NOT DETECTED NOT DETECTED Final   Haemophilus influenzae NOT DETECTED NOT DETECTED Final   Neisseria meningitidis NOT DETECTED NOT DETECTED Final   Pseudomonas aeruginosa NOT DETECTED NOT DETECTED Final   Candida albicans NOT DETECTED NOT DETECTED Final   Candida glabrata NOT DETECTED NOT DETECTED Final   Candida krusei NOT DETECTED NOT DETECTED Final   Candida parapsilosis NOT DETECTED NOT DETECTED Final   Candida tropicalis NOT DETECTED NOT DETECTED Final    Comment: Performed at [facility], [address] [place], [place] [address]  Blood Culture (routine x 2)     Status: None (Preliminary result)   Collection Time: 11/21/18  8:50 AM   Specimen: BLOOD LEFT FOREARM  Result Value Ref Range Status   Specimen Description   Final    BLOOD LEFT FOREARM Performed at [facility], [address] [place], [place] [address]    Special Requests   Final    BOTTLES DRAWN AEROBIC AND ANAEROBIC Blood Culture adequate volume Performed at [facility], [address] [place], [place] [address]    Culture   Final    NO GROWTH 1 DAY Performed at [facility], [address] [place], [place] [address]    Report Status PENDING  Incomplete      Studies: Ct Angio Chest Pe W Or Wo Contrast  Result Date: 11/21/2018 CLINICAL DATA:  Fever of unknown origin. Dyspnea. Generalized weakness. Nausea and vomiting. EXAM: CT ANGIOGRAPHY CHEST CT ABDOMEN AND PELVIS WITH CONTRAST TECHNIQUE: Multidetector CT imaging of the chest was performed using the standard protocol during bolus administration of intravenous contrast. Multiplanar CT image reconstructions and MIPs were obtained to evaluate the vascular anatomy. Multidetector CT imaging of the abdomen and pelvis was performed using the standard protocol during bolus administration of intravenous contrast. CONTRAST:  100mL OMNIPAQUE IOHEXOL 350 MG/ML SOLN COMPARISON:  08/06/2009 CT chest, abdomen and pelvis. FINDINGS: CTA CHEST FINDINGS Cardiovascular: The study is low to moderate quality for the evaluation of pulmonary embolism, degraded by motion. There are no convincing filling defects in the central, lobar, segmental or subsegmental pulmonary artery branches to suggest acute pulmonary embolism. Atherosclerotic nonaneurysmal thoracic aorta. Normal caliber main pulmonary artery. Normal heart size. No significant pericardial fluid/thickening. Left anterior descending and left circumflex coronary atherosclerosis.  Mediastinum/Nodes: No discrete thyroid nodules. Unremarkable esophagus. No pathologically enlarged axillary, mediastinal or hilar lymph nodes. Lungs/Pleura: No pneumothorax. No pleural effusion. No acute consolidative airspace disease, lung masses or significant pulmonary nodules. Musculoskeletal: No aggressive appearing focal osseous lesions. Mild T6 vertebral compression fracture is new since 2011 CT, of indeterminate chronicity. Moderate thoracic spondylosis. Review of the MIP images confirms the above findings. CT ABDOMEN and PELVIS FINDINGS Hepatobiliary: Normal liver size. Hypodense right liver dome  1.1 cm lesion (series 11/image 16), stable in size since 08/06/2009 CT, considered benign. Progressively enhancing 1.3 cm inferior right liver lesion (series 16/image 25), stable in size since 2011 CT, compatible with a benign hemangioma. Subcentimeter hypodense segment 3 left liver lobe lesion is too small to characterize, not definitely seen on prior CT, for which no follow-up is required unless the patient has risk factors for liver malignancy. Scattered granulomatous liver calcifications. Cholelithiasis. No gallbladder wall thickening or pericholecystic fluid. No biliary ductal dilatation. Pancreas: Normal, with no mass or duct dilation. Spleen: Normal size. No mass. Adrenals/Urinary Tract: Normal adrenals. Patchy bilateral contrast nephrograms with hazy perinephric fat, compatible with acute bilateral pyelonephritis. No renal masses or perinephric collections. No hydronephrosis. Normal bladder. Stomach/Bowel: Small hiatal hernia. Otherwise normal nondistended stomach. Normal caliber small bowel with no small bowel wall thickening. Normal appendix. Normal large bowel with no diverticulosis, large bowel wall thickening or pericolonic fat stranding. Vascular/Lymphatic: Atherosclerotic nonaneurysmal abdominal aorta. Patent portal, splenic, hepatic and renal veins. No pathologically enlarged lymph nodes in the  abdomen or pelvis. Reproductive: Status post hysterectomy, with no abnormal findings at the vaginal cuff. Cystic 3.0 x 2.8 cm right adnexal lesion with suggestion of thin internal septation (series 11/image 65), which may represent growth of previously visualized 2.3 x 1.7 cm right adnexal cystic lesion on 2011 CT. No left adnexal mass. Other: No pneumoperitoneum, ascites or focal fluid collection. Musculoskeletal: No aggressive appearing focal osseous lesions. Marked lumbar spondylosis and facet arthropathy with chronic 9 mm anterolisthesis at L4-5. Review of the MIP images confirms the above findings. IMPRESSION: CT CHEST: 1. Limited motion degraded scan. No evidence of pulmonary embolism. No active pulmonary disease. 2. Two vessel coronary atherosclerosis. 3. Mild T6 vertebral compression fracture, new since 2011 CT, of indeterminate chronicity, chronic appearing. CT ABDOMEN / PELVIS: 1. CT findings suggest acute bilateral pyelonephritis. No evidence of renal abscess. No hydronephrosis. 2. Septated 3.0 cm right adnexal cystic lesion, mildly increased from 2011 CT. Suggest nonemergent outpatient pelvic ultrasound for further evaluation. 3. Cholelithiasis. 4. Small hiatal hernia. 5.  Aortic Atherosclerosis (ICD10-I70.0). Electronically Signed   By: Delbert Phenix M.D.   On: 11/21/2018 17:23   Ct Abdomen Pelvis W Contrast  Result Date: 11/21/2018 CLINICAL DATA:  Fever of unknown origin. Dyspnea. Generalized weakness. Nausea and vomiting. EXAM: CT ANGIOGRAPHY CHEST CT ABDOMEN AND PELVIS WITH CONTRAST TECHNIQUE: Multidetector CT imaging of the chest was performed using the standard protocol during bolus administration of intravenous contrast. Multiplanar CT image reconstructions and MIPs were obtained to evaluate the vascular anatomy. Multidetector CT imaging of the abdomen and pelvis was performed using the standard protocol during bolus administration of intravenous contrast. CONTRAST:  OMNIPAQUE IOHEXOL 350  MG/ML SOLN COMPARISON:  08/06/2009 CT chest, abdomen and pelvis. FINDINGS: CTA CHEST FINDINGS Cardiovascular: The study is low to moderate quality for the evaluation of pulmonary embolism, degraded by motion. There are no convincing filling defects in the central, lobar, segmental or subsegmental pulmonary artery branches to suggest acute pulmonary embolism. Atherosclerotic nonaneurysmal thoracic aorta. Normal caliber main pulmonary artery. Normal heart size. No significant pericardial fluid/thickening. Left anterior descending and left circumflex coronary atherosclerosis. Mediastinum/Nodes: No discrete thyroid nodules. Unremarkable esophagus. No pathologically enlarged axillary, mediastinal or hilar lymph nodes. Lungs/Pleura: No pneumothorax. No pleural effusion. No acute consolidative airspace disease, lung masses or significant pulmonary nodules. Musculoskeletal: No aggressive appearing focal osseous lesions. Mild T6 vertebral compression fracture is new since 2011 CT, of indeterminate chronicity. Moderate thoracic spondylosis. Review of the  MIP images confirms the above findings. CT ABDOMEN and PELVIS FINDINGS Hepatobiliary: Normal liver size. Hypodense right liver dome 1.1 cm lesion (series 11/image 16), stable in size since 08/06/2009 CT, considered benign. Progressively enhancing 1.3 cm inferior right liver lesion (series 16/image 25), stable in size since 2011 CT, compatible with a benign hemangioma. Subcentimeter hypodense segment 3 left liver lobe lesion is too small to characterize, not definitely seen on prior CT, for which no follow-up is required unless the patient has risk factors for liver malignancy. Scattered granulomatous liver calcifications. Cholelithiasis. No gallbladder wall thickening or pericholecystic fluid. No biliary ductal dilatation. Pancreas: Normal, with no mass or duct dilation. Spleen: Normal size. No mass. Adrenals/Urinary Tract: Normal adrenals. Patchy bilateral contrast  nephrograms with hazy perinephric fat, compatible with acute bilateral pyelonephritis. No renal masses or perinephric collections. No hydronephrosis. Normal bladder. Stomach/Bowel: Small hiatal hernia. Otherwise normal nondistended stomach. Normal caliber small bowel with no small bowel wall thickening. Normal appendix. Normal large bowel with no diverticulosis, large bowel wall thickening or pericolonic fat stranding. Vascular/Lymphatic: Atherosclerotic nonaneurysmal abdominal aorta. Patent portal, splenic, hepatic and renal veins. No pathologically enlarged lymph nodes in the abdomen or pelvis. Reproductive: Status post hysterectomy, with no abnormal findings at the vaginal cuff. Cystic 3.0 x 2.8 cm right adnexal lesion with suggestion of thin internal septation (series 11/image 65), which may represent growth of previously visualized 2.3 x 1.7 cm right adnexal cystic lesion on 2011 CT. No left adnexal mass. Other: No pneumoperitoneum, ascites or focal fluid collection. Musculoskeletal: No aggressive appearing focal osseous lesions. Marked lumbar spondylosis and facet arthropathy with chronic 9 mm anterolisthesis at L4-5. Review of the MIP images confirms the above findings. IMPRESSION: CT CHEST: 1. Limited motion degraded scan. No evidence of pulmonary embolism. No active pulmonary disease. 2. Two vessel coronary atherosclerosis. 3. Mild T6 vertebral compression fracture, new since 2011 CT, of indeterminate chronicity, chronic appearing. CT ABDOMEN / PELVIS: 1. CT findings suggest acute bilateral pyelonephritis. No evidence of renal abscess. No hydronephrosis. 2. Septated 3.0 cm right adnexal cystic lesion, mildly increased from 2011 CT. Suggest nonemergent outpatient pelvic ultrasound for further evaluation. 3. Cholelithiasis. 4. Small hiatal hernia. 5.  Aortic Atherosclerosis (ICD10-I70.0). Electronically Signed   By: Delbert PhenixJason A Poff M.D.   On: 11/21/2018 17:23    Scheduled Meds:  arformoterol  15 mcg  Nebulization BID   enoxaparin (LOVENOX) injection  40 mg Subcutaneous Q24H   polyethylene glycol  17 g Oral Daily   sertraline  150 mg Oral Daily   triamcinolone ointment   Topical BID   umeclidinium bromide  1 puff Inhalation Daily    Continuous Infusions:  sodium chloride 1,000 mL (11/22/18 1041)   cefTRIAXone (ROCEPHIN)  IV       LOS: 1 day     Darlin Droparole N Kynsleigh Westendorf, MD [facility] Pager [phone]  If 7PM-7AM, please contact night-coverage [email or web address] Password [email or web address] 11/22/2018, 12:36 PM

## 2018-11-22 NOTE — Progress Notes (Signed)
PHARMACY - PHYSICIAN COMMUNICATION CRITICAL VALUE ALERT - BLOOD CULTURE IDENTIFICATION (BCID)  Jody Mcclure is an 63 y.o. female who presented to [facility] on 11/21/2018 with a chief complaint of general malaise  Assessment:  E coli bacteremia (denies urinary symptoms but imaging most c/w kidney infection)  Name of physician (or Provider) Contacted: [place] Crane  Current antibiotics: Cefepime  Changes to prescribed antibiotics recommended: No Hx ESBL or PsA; narrowed to Rocephin per protocol; not yet seen by provider   Results for orders placed or performed during the hospital encounter of 11/21/18  Blood Culture ID Panel (Reflexed) (Collected: 11/21/2018  8:38 AM)  Result Value Ref Range   Enterococcus species NOT DETECTED NOT DETECTED   Listeria monocytogenes NOT DETECTED NOT DETECTED   Staphylococcus species NOT DETECTED NOT DETECTED   Staphylococcus aureus (BCID) NOT DETECTED NOT DETECTED   Streptococcus species NOT DETECTED NOT DETECTED   Streptococcus agalactiae NOT DETECTED NOT DETECTED   Streptococcus pneumoniae NOT DETECTED NOT DETECTED   Streptococcus pyogenes NOT DETECTED NOT DETECTED   Acinetobacter baumannii NOT DETECTED NOT DETECTED   Enterobacteriaceae species DETECTED (A) NOT DETECTED   Enterobacter cloacae complex NOT DETECTED NOT DETECTED   Escherichia coli DETECTED (A) NOT DETECTED   Klebsiella oxytoca NOT DETECTED NOT DETECTED   Klebsiella pneumoniae NOT DETECTED NOT DETECTED   Proteus species NOT DETECTED NOT DETECTED   Serratia marcescens NOT DETECTED NOT DETECTED   Carbapenem resistance NOT DETECTED NOT DETECTED   Haemophilus influenzae NOT DETECTED NOT DETECTED   Neisseria meningitidis NOT DETECTED NOT DETECTED   Pseudomonas aeruginosa NOT DETECTED NOT DETECTED   Candida albicans NOT DETECTED NOT DETECTED   Candida glabrata NOT DETECTED NOT DETECTED   Candida krusei NOT DETECTED NOT DETECTED   Candida parapsilosis NOT DETECTED NOT DETECTED   Candida tropicalis  NOT DETECTED NOT DETECTED    Evonte Prestage A 11/22/2018  8:08 AM

## 2018-11-23 LAB — CBC WITH DIFFERENTIAL/PLATELET
Abs Immature Granulocytes: 0.17 10*3/uL — ABNORMAL HIGH (ref 0.00–0.07)
Basophils Absolute: 0.1 10*3/uL (ref 0.0–0.1)
Basophils Relative: 1 %
Eosinophils Absolute: 0.2 10*3/uL (ref 0.0–0.5)
Eosinophils Relative: 2 %
HCT: 29.6 % — ABNORMAL LOW (ref 36.0–46.0)
Hemoglobin: 9.2 g/dL — ABNORMAL LOW (ref 12.0–15.0)
Immature Granulocytes: 2 %
Lymphocytes Relative: 12 %
Lymphs Abs: 1.1 10*3/uL (ref 0.7–4.0)
MCH: 29.6 pg (ref 26.0–34.0)
MCHC: 31.1 g/dL (ref 30.0–36.0)
MCV: 95.2 fL (ref 80.0–100.0)
Monocytes Absolute: 0.9 10*3/uL (ref 0.1–1.0)
Monocytes Relative: 10 %
Neutro Abs: 6.6 10*3/uL (ref 1.7–7.7)
Neutrophils Relative %: 73 %
Platelets: 321 10*3/uL (ref 150–400)
RBC: 3.11 MIL/uL — ABNORMAL LOW (ref 3.87–5.11)
RDW: 15.1 % (ref 11.5–15.5)
WBC: 9 10*3/uL (ref 4.0–10.5)
nRBC: 0 % (ref 0.0–0.2)

## 2018-11-23 LAB — BASIC METABOLIC PANEL
Anion gap: 8 (ref 5–15)
BUN: <5 mg/dL — ABNORMAL LOW (ref 8–23)
CO2: 24 mmol/L (ref 22–32)
Calcium: 8.1 mg/dL — ABNORMAL LOW (ref 8.9–10.3)
Chloride: 108 mmol/L (ref 98–111)
Creatinine, Ser: 0.82 mg/dL (ref 0.44–1.00)
GFR calc Af Amer: >60 mL/min (ref >60)
GFR calc non Af Amer: >60 mL/min (ref >60)
Glucose, Bld: 107 mg/dL — ABNORMAL HIGH (ref 70–99)
Potassium: 3.6 mmol/L (ref 3.5–5.1)
Sodium: 140 mmol/L (ref 135–145)

## 2018-11-23 LAB — HEPATITIS PANEL, ACUTE
HCV Ab: 0.1 s/co ratio (ref 0.0–0.9)
Hep A IgM: NEGATIVE
Hep B C IgM: NEGATIVE
Hepatitis B Surface Ag: NEGATIVE

## 2018-11-23 LAB — HIV ANTIBODY (ROUTINE TESTING W REFLEX): HIV Screen 4th Generation wRfx: NONREACTIVE

## 2018-11-23 LAB — URINE CULTURE
Culture: NO GROWTH
Special Requests: NORMAL

## 2018-11-23 MED ORDER — SODIUM CHLORIDE 0.9 % IV SOLN: 1000.0000 mL | INTRAVENOUS | Status: DC

## 2018-11-23 NOTE — Plan of Care (Signed)
  Problem: Education: Goal: Knowledge of General Education information will improve Description: Including pain rating scale, medication(s)/side effects and non-pharmacologic comfort measures Outcome: Progressing   Problem: Health Behavior/Discharge Planning: Goal: Ability to manage health-related needs will improve Outcome: Progressing   Problem: Activity: Goal: Risk for activity intolerance will decrease Outcome: Progressing   Problem: Coping: Goal: Level of anxiety will decrease Outcome: Progressing   Problem: Elimination: Goal: Will not experience complications related to urinary retention Outcome: Progressing   Problem: Pain Managment: Goal: General experience of comfort will improve Outcome: Progressing

## 2018-11-23 NOTE — Progress Notes (Signed)
PROGRESS NOTE  Jody Mcclure ZOX:[id number] DOB: Mar 05, 1956 DOA: 11/21/2018 PCP: Thom Chimes, MD  HPI/Recap of past 24 hours: Jody Mcclure is a 63 y.o. female with medical history significant of anxiety, depression, HTN, HLD presenting with general malaise over the past week.  She notes that she started have subjective fever and chills.  She also just noticed shaking chills.  Her symptoms have been present over the past week, but have gotten worse over the past day or so.  She denies cough, SOB, CP, abdominal pain, dysuria or UTI symptoms.  She denies sick contacts.  Denies smoking.  She does drink alcohol.  Also hx of cocaine use.    Admitted for sepsis secondary to acute bilateral pyelonephritis.  11/23/18: Patient was seen and examined at her bedside this morning.  Reports lower abdominal pain last night that has resolved spontaneously this morning.  Nausea is improving.  States she was wheezing earlier, that also has resolved.  On nebs scheduled and as needed.  T-max 99.4 overnight.     Assessment/Plan: Active Problems:   Sepsis (HCC)   Fever and chills  Sepsis secondary to acute bilateral pyelonephritis and E. coli bacteremia Presented with fever with T-max 102.4, leukocytosis with WBC 15K, tachypnea with respiration rate of 27 and generalized weakness. CTA PE, CT abdomen and pelvis with contrast showed bilateral hydronephrosis, UA with pyuria, urine culture collected on 11/22/2018 shows no growth. Blood cultures drawn on 11/21/2018 grew E. coli 2/2 bottles Initially on cefepime, changed to Rocephin 2 g daily by pharmacy on 11/22/18 Sensitivities are pending Repeat blood culture peripherally x2 on 11/23/2018 in process Sepsis physiology is resolving, WBC 9.0 on 11/23/2018. Optimize pain control  E. coli bacteremia Management as stated above Urine culture collected post antibiotics on 11/22/2018 no growth Calcitonin 2.34 on 09/23/2018 Obtain procalcitonin on 11/24/2018.  Resolved  hypokalemia post repletion  History of hypertension Blood pressures currently at goal. Continue to monitor vital signs  Normocytic anemia with drop in hemoglobin Baseline hemoglobin appears to be 14 K Presented with hemoglobin of 11.9 >>9.2 Hemoglobin dropped to 9.7 on 11/22/2018, suspect dilutional in the setting of IV fluid hydration Encourage oral intake, DC IV fluid 11/23/2018.    DVT prophylaxis: lovenox subcu daily Code Status: full Family Communication: none at bedside, pt to update sig other  Disposition Plan:  Possible DC to home tomorrow 11/24/2018 after switching to oral antibiotics.  Consults called: none   Objective: Vitals:   11/22/18 2010 11/22/18 2050 11/23/18 0545 11/23/18 0811  BP: 124/78  121/90   Pulse: 72  83   Resp: 18  18   Temp: 98.9 F (37.2 C)  99.4 F (37.4 C)   TempSrc: Oral  Oral   SpO2: 100% 96% 99% 94%  Weight:   57.7 kg   Height:        Intake/Output Summary (Last 24 hours) at 11/23/2018 1111 Last data filed at 11/23/2018 1055 Gross per 24 hour  Intake 2143.6 ml  Output 575 ml  Net 1568.6 ml   Filed Weights   11/21/18 1821 11/22/18 0500 11/23/18 0545  Weight: 58.1 kg 57.3 kg 57.7 kg    Exam:  . General: 63 y.o. year-old female (nourished in no acute distress.  Alert and oriented x4.   . Cardiovascular: Regular rate and rhythm no rubs or gallops no JVD or thyromegaly noted.   Marland Kitchen Respiratory: Clear to auscultation no wheezes or rales.  Good inspiratory effort.   . Abdomen: Soft nontender nondistended  normal bowel sounds present.  . Musculoskeletal: No lower extremity edema.  2 out of 4 pulses in all 4 extremities. Marland Kitchen. Psychiatry: Mood is appropriate for condition and setting.   Data Reviewed: CBC: Recent Labs  Lab 11/21/18 0838 11/21/18 1932 11/22/18 0342 11/23/18 0720  WBC 15.6* 11.8* 12.7* 9.0  NEUTROABS 12.4*  --   --  6.6  HGB 11.9* 9.4* 9.7* 9.2*  HCT 35.9* 29.7* 31.5* 29.6*  MCV 90.9 92.8 95.5 95.2  PLT 278 226 264  321   Basic Metabolic Panel: Recent Labs  Lab 11/21/18 0838 11/21/18 1932 11/22/18 0342 11/23/18 0720  NA 135  --  139 140  K 2.7*  --  3.5 3.6  CL 101  --  112* 108  CO2 22  --  21* 24  GLUCOSE 134*  --  117* 107*  BUN 10  --  8 <5*  CREATININE 0.94 0.82 0.79 0.82  CALCIUM 8.3*  --  7.8* 8.1*  MG 1.7  --   --   --    GFR: Estimated Creatinine Clearance: 56.6 mL/min (by C-G formula based on SCr of 0.82 mg/dL). Liver Function Tests: Recent Labs  Lab 11/21/18 0838 11/22/18 0342  AST 51* 29  ALT 51* 39  ALKPHOS 107 92  BILITOT 1.3* 0.7  PROT 7.7 6.7  ALBUMIN 3.2* 2.7*   No results for input(s): LIPASE, AMYLASE in the last 168 hours. No results for input(s): AMMONIA in the last 168 hours. Coagulation Profile: No results for input(s): INR, PROTIME in the last 168 hours. Cardiac Enzymes: No results for input(s): CKTOTAL, CKMB, CKMBINDEX, TROPONINI in the last 168 hours. BNP (last 3 results) No results for input(s): PROBNP in the last 8760 hours. HbA1C: No results for input(s): HGBA1C in the last 72 hours. CBG: No results for input(s): GLUCAP in the last 168 hours. Lipid Profile: Recent Labs    11/21/18 0838  TRIG 150*   Thyroid Function Tests: No results for input(s): TSH, T4TOTAL, FREET4, T3FREE, THYROIDAB in the last 72 hours. Anemia Panel: Recent Labs    11/21/18 0838  FERRITIN 277   Urine analysis:    Component Value Date/Time   COLORURINE YELLOW 11/22/2018 0617   APPEARANCEUR CLEAR 11/22/2018 0617   LABSPEC 1.008 11/22/2018 0617   PHURINE 5.0 11/22/2018 0617   GLUCOSEU NEGATIVE 11/22/2018 0617   HGBUR SMALL (A) 11/22/2018 0617   BILIRUBINUR NEGATIVE 11/22/2018 0617   KETONESUR NEGATIVE 11/22/2018 0617   PROTEINUR NEGATIVE 11/22/2018 0617   UROBILINOGEN 0.2 02/23/2013 1058   NITRITE NEGATIVE 11/22/2018 0617   LEUKOCYTESUR NEGATIVE 11/22/2018 0617   Sepsis Labs: @LABRCNTIP (procalcitonin:4,lacticidven:4)  ) Recent Results (from the past 240  hour(s))  SARS Coronavirus 2 [facility](Hospital order, Performed in [facility]) Nasopharyngeal Nasopharyngeal Swab     Status: None   Collection Time: 11/21/18  8:09 AM   Specimen: Nasopharyngeal Swab  Result Value Ref Range Status   SARS Coronavirus 2 NEGATIVE NEGATIVE Final    Comment: (NOTE) If result is NEGATIVE SARS-CoV-2 target nucleic acids are NOT DETECTED. The SARS-CoV-2 RNA is generally detectable in upper and lower  respiratory specimens during the acute phase of infection. The lowest  concentration of SARS-CoV-2 viral copies this assay can detect is 250  copies / mL. A negative result does not preclude SARS-CoV-2 infection  and should not be used as the sole basis for treatment or other  patient management decisions.  A negative result may occur with  improper specimen collection / handling, submission  of specimen other  than nasopharyngeal swab, presence of viral mutation(s) within the  areas targeted by this assay, and inadequate number of viral copies  (<250 copies / mL). A negative result must be combined with clinical  observations, patient history, and epidemiological information. If result is POSITIVE SARS-CoV-2 target nucleic acids are DETECTED. The SARS-CoV-2 RNA is generally detectable in upper and lower  respiratory specimens dur ing the acute phase of infection.  Positive  results are indicative of active infection with SARS-CoV-2.  Clinical  correlation with patient history and other diagnostic information is  necessary to determine patient infection status.  Positive results do  not rule out bacterial infection or co-infection with other viruses. If result is PRESUMPTIVE POSTIVE SARS-CoV-2 nucleic acids MAY BE PRESENT.   A presumptive positive result was obtained on the submitted specimen  and confirmed on repeat testing.  While 2019 novel coronavirus  (SARS-CoV-2) nucleic acids may be present in the submitted sample  additional confirmatory testing  may be necessary for epidemiological  and / or clinical management purposes  to differentiate between  SARS-CoV-2 and other Sarbecovirus currently known to infect humans.  If clinically indicated additional testing with an alternate test  methodology [phone]) is advised. The SARS-CoV-2 RNA is generally  detectable in upper and lower respiratory sp ecimens during the acute  phase of infection. The expected result is Negative. Fact Sheet for Patients:  StrictlyIdeas.no Fact Sheet for Healthcare Providers: BankingDealers.co.za This test is not yet approved or cleared by the [place] FDA and has been authorized for detection and/or diagnosis of SARS-CoV-2 by FDA under an Emergency Use Authorization (EUA).  This EUA will remain in effect (meaning this test can be used) for the duration of the COVID-19 declaration under Section 564(b)(1) of the Act, 21 U.S.C. section 360bbb-3(b)(1), unless the authorization is terminated or revoked sooner. Performed at [facility], [place] [address] [place], [place] [address]   Blood Culture (routine x 2)     Status: Abnormal (Preliminary result)   Collection Time: 11/21/18  8:38 AM   Specimen: BLOOD  Result Value Ref Range Status   Specimen Description   Final    BLOOD LEFT ANTECUBITAL Performed at [place] [address] [place], [place] [address]    Special Requests   Final    BOTTLES DRAWN AEROBIC AND ANAEROBIC Blood Culture results may not be optimal due to an excessive volume of blood received in culture bottles Performed at [facility] [place] [address], [place], [place] [address]    Culture  Setup Time   Final    GRAM NEGATIVE RODS AEROBIC BOTTLE ONLY CRITICAL RESULT CALLED TO, READ BACK BY AND VERIFIED WITH: Alanda Amass PHARMD, AT [phone] 11/22/18 BY Rush Landmark Performed at [facility], [place] [address] [place], [place] [address]     Culture ESCHERICHIA COLI (A)  Final   Report Status PENDING  Incomplete  Blood Culture ID Panel (Reflexed)     Status: Abnormal   Collection Time: 11/21/18  8:38 AM  Result Value Ref Range Status   Enterococcus species NOT DETECTED NOT DETECTED Final   Listeria monocytogenes NOT DETECTED NOT DETECTED Final   Staphylococcus species NOT DETECTED NOT DETECTED Final   Staphylococcus aureus (BCID) NOT DETECTED NOT DETECTED Final   Streptococcus species NOT DETECTED NOT DETECTED Final   Streptococcus agalactiae NOT DETECTED NOT DETECTED Final   Streptococcus pneumoniae NOT DETECTED NOT DETECTED Final   Streptococcus pyogenes NOT  DETECTED NOT DETECTED Final   Acinetobacter baumannii NOT DETECTED NOT DETECTED Final   Enterobacteriaceae species DETECTED (A) NOT DETECTED Final    Comment: Enterobacteriaceae represent a large family of gram-negative bacteria, not a single organism. CRITICAL RESULT CALLED TO, READ BACK BY AND VERIFIED WITH: D. WOFFORD PHARMD, AT [phone] 11/22/18 BY D. VANHOOK    Enterobacter cloacae complex NOT DETECTED NOT DETECTED Final   Escherichia coli DETECTED (A) NOT DETECTED Final    Comment: CRITICAL RESULT CALLED TO, READ BACK BY AND VERIFIED WITH: D. WOFFORD PHARMD, AT [phone] 11/22/18 BY D. VANHOOK    Klebsiella oxytoca NOT DETECTED NOT DETECTED Final   Klebsiella pneumoniae NOT DETECTED NOT DETECTED Final   Proteus species NOT DETECTED NOT DETECTED Final   Serratia marcescens NOT DETECTED NOT DETECTED Final   Carbapenem resistance NOT DETECTED NOT DETECTED Final   Haemophilus influenzae NOT DETECTED NOT DETECTED Final   Neisseria meningitidis NOT DETECTED NOT DETECTED Final   Pseudomonas aeruginosa NOT DETECTED NOT DETECTED Final   Candida albicans NOT DETECTED NOT DETECTED Final   Candida glabrata NOT DETECTED NOT DETECTED Final   Candida krusei NOT DETECTED NOT DETECTED Final   Candida parapsilosis NOT DETECTED NOT DETECTED Final   Candida tropicalis NOT DETECTED  NOT DETECTED Final    Comment: Performed at [facility], [address] [place], [place] [address]  Blood Culture (routine x 2)     Status: None (Preliminary result)   Collection Time: 11/21/18  8:50 AM   Specimen: BLOOD LEFT FOREARM  Result Value Ref Range Status   Specimen Description   Final    BLOOD LEFT FOREARM Performed at [facility], [address] [place], [place] [address]    Special Requests   Final    BOTTLES DRAWN AEROBIC AND ANAEROBIC Blood Culture adequate volume Performed at [facility], [address] [place], [place] [address]    Culture  Setup Time   Final    GRAM NEGATIVE RODS AEROBIC BOTTLE ONLY CRITICAL VALUE NOTED.  VALUE IS CONSISTENT WITH PREVIOUSLY REPORTED AND CALLED VALUE. Performed at [facility], [address] [place], [place] [address]    Culture GRAM NEGATIVE RODS  Final   Report Status PENDING  Incomplete  Culture, Urine     Status: None   Collection Time: 11/22/18  6:18 AM   Specimen: Urine, Clean Catch  Result Value Ref Range Status   Specimen Description   Final    URINE, CLEAN CATCH Performed at [facility], [address] [place], [place] [address]    Special Requests   Final    Normal Performed at [facility], [address] [place], [place] [address]    Culture   Final    NO GROWTH Performed at [facility], [address] [place], [place] [address]    Report Status 11/23/2018 FINAL  Final      Studies: No results found.  Scheduled Meds: . arformoterol  15 mcg Nebulization BID  . enoxaparin (LOVENOX) injection  40 mg Subcutaneous Q24H  . polyethylene glycol  17 g Oral Daily  . sertraline  150 mg Oral Daily  . traZODone  25 mg Oral QHS  . triamcinolone ointment   Topical BID  . umeclidinium bromide  1 puff Inhalation Daily    Continuous Infusions: . sodium chloride 1,000 mL (11/23/18 0934)  . cefTRIAXone (ROCEPHIN)   IV Stopped (11/22/18 1908)  LOS: 2 days     Darlin Droparole N Davarion Cuffee, MD [facility] Pager [phone]  If 7PM-7AM, please contact night-coverage [email or web address] Password [email or web address] 11/23/2018, 11:11 AM

## 2018-11-24 LAB — CULTURE, BLOOD (ROUTINE X 2): Special Requests: ADEQUATE

## 2018-11-24 LAB — PROCALCITONIN: Procalcitonin: 2.93 ng/mL

## 2018-11-24 NOTE — Progress Notes (Signed)
PROGRESS NOTE  Jody Mcclure Yanda ZOX:[id number] DOB: 1956-03-11 DOA: 11/21/2018 PCP: Thom ChimesJones, Ellen, MD  HPI/Recap of past 24 hours: Jody Mcclure Mullan is a 63 y.o. female with medical history significant of anxiety, depression, HTN, HLD presenting with general malaise over the past week.  She notes that she started have subjective fever and chills.  She also just noticed shaking chills.  Her symptoms have been present over the past week, but have gotten worse over the past day or so.  She denies cough, SOB, CP, abdominal pain, dysuria or UTI symptoms.  She denies sick contacts.  Denies smoking.  She does drink alcohol.  Also hx of cocaine use.    Admitted for sepsis secondary to acute bilateral pyelonephritis.  11/24/18: She was seen and examined at her bedside.  Reports lower abdominal pain.  Denies nausea.  T-max 99.1 with worsening procalcitonin level.  Awaiting sensitivities of blood culture.  Currently on Rocephin IV.   Assessment/Plan: Active Problems:   Sepsis (HCC)   Fever and chills  Sepsis secondary to acute bilateral pyelonephritis and E. coli bacteremia Presented with fever with T-max 102.4, leukocytosis with WBC 15K, tachypnea with respiration rate of 27 and generalized weakness. CTA PE, CT abdomen and pelvis with contrast showed bilateral hydronephrosis, UA with pyuria, urine culture collected on 11/22/2018 shows no growth. Blood cultures drawn on 11/21/2018 grew E. coli 2/2 bottles Initially on cefepime, changed to Rocephin 2 g daily by pharmacy on 11/22/18 Awaiting results of sensitivities Procalcitonin worsening from 2.34 to 2.93 on 11/24/2018 Repeat blood culture peripherally x2 on 11/23/2018 no growth to date Continue to optimize pain control  E. coli bacteremia Management as stated above Urine culture collected post antibiotics on 11/22/2018 no growth ProCalcitonin 2.34 on 09/23/2018>> 2.93 on 11/24/2018  T-max 99.1 overnight Awaiting sensitivities  Resolved hypokalemia post  repletion  History of hypertension Blood pressures currently at goal. Continue to monitor vital signs  Normocytic anemia with drop in hemoglobin Baseline hemoglobin appears to be 14 K Presented with hemoglobin of 11.9 >>9.2 Hemoglobin dropped to 9.7 on 11/22/2018, suspect dilutional in the setting of IV fluid hydration Encourage oral intake, DC IV fluid 11/23/2018. No sign of overt bleed    DVT prophylaxis: lovenox subcu daily Code Status: full Family Communication: none at bedside, pt to update sig other  Disposition Plan:  Possible DC to home tomorrow 11/25/2018 when sensitivities returned and switched to oral antibiotics. Consults called: none   Objective: Vitals:   11/23/18 2105 11/23/18 2143 11/24/18 0527 11/24/18 1021  BP: (!) 148/93  (!) 152/90   Pulse: 70  65   Resp: 18  18   Temp: 98.9 F (37.2 C)  99.1 F (37.3 C)   TempSrc: Oral  Oral   SpO2: 96% 91% 94% 94%  Weight:   57.8 kg   Height:        Intake/Output Summary (Last 24 hours) at 11/24/2018 1215 Last data filed at 11/24/2018 0941 Gross per 24 hour  Intake 700 ml  Output 700 ml  Net 0 ml   Filed Weights   11/22/18 0500 11/23/18 0545 11/24/18 0527  Weight: 57.3 kg 57.7 kg 57.8 kg    Exam:  . General: 63 y.o. year-old female well-developed well-nourished in no acute distress.  Alert and oriented x4.   . Cardiovascular: Regular rate and rhythm no rubs or gallops no JVD or thyromegaly. Marland Kitchen. Respiratory: Clear to auscultation no wheezes or rales.  Poor inspiratory effort.   . Abdomen: Mild tenderness  lower abdominal on palpation.  Bowel sounds present.   . Musculoskeletal: No lower extremity edema.  2 out of 4 pulses in all 4 extremities. Marland Kitchen Psychiatry: Mood is appropriate for condition and setting.  Data Reviewed: CBC: Recent Labs  Lab 11/21/18 0838 11/21/18 1932 11/22/18 0342 11/23/18 0720  WBC 15.6* 11.8* 12.7* 9.0  NEUTROABS 12.4*  --   --  6.6  HGB 11.9* 9.4* 9.7* 9.2*  HCT 35.9* 29.7* 31.5*  29.6*  MCV 90.9 92.8 95.5 95.2  PLT 278 226 264 378   Basic Metabolic Panel: Recent Labs  Lab 11/21/18 0838 11/21/18 1932 11/22/18 0342 11/23/18 0720  NA 135  --  139 140  K 2.7*  --  3.5 3.6  CL 101  --  112* 108  CO2 22  --  21* 24  GLUCOSE 134*  --  117* 107*  BUN 10  --  8 <5*  CREATININE 0.94 0.82 0.79 0.82  CALCIUM 8.3*  --  7.8* 8.1*  MG 1.7  --   --   --    GFR: Estimated Creatinine Clearance: 56.6 mL/min (by C-G formula based on SCr of 0.82 mg/dL). Liver Function Tests: Recent Labs  Lab 11/21/18 0838 11/22/18 0342  AST 51* 29  ALT 51* 39  ALKPHOS 107 92  BILITOT 1.3* 0.7  PROT 7.7 6.7  ALBUMIN 3.2* 2.7*   No results for input(s): LIPASE, AMYLASE in the last 168 hours. No results for input(s): AMMONIA in the last 168 hours. Coagulation Profile: No results for input(s): INR, PROTIME in the last 168 hours. Cardiac Enzymes: No results for input(s): CKTOTAL, CKMB, CKMBINDEX, TROPONINI in the last 168 hours. BNP (last 3 results) No results for input(s): PROBNP in the last 8760 hours. HbA1C: No results for input(s): HGBA1C in the last 72 hours. CBG: No results for input(s): GLUCAP in the last 168 hours. Lipid Profile: No results for input(s): CHOL, HDL, LDLCALC, TRIG, CHOLHDL, LDLDIRECT in the last 72 hours. Thyroid Function Tests: No results for input(s): TSH, T4TOTAL, FREET4, T3FREE, THYROIDAB in the last 72 hours. Anemia Panel: No results for input(s): VITAMINB12, FOLATE, FERRITIN, TIBC, IRON, RETICCTPCT in the last 72 hours. Urine analysis:    Component Value Date/Time   COLORURINE YELLOW 11/22/2018 0617   APPEARANCEUR CLEAR 11/22/2018 0617   LABSPEC 1.008 11/22/2018 0617   PHURINE 5.0 11/22/2018 0617   GLUCOSEU NEGATIVE 11/22/2018 0617   HGBUR SMALL (A) 11/22/2018 0617   BILIRUBINUR NEGATIVE 11/22/2018 0617   KETONESUR NEGATIVE 11/22/2018 0617   PROTEINUR NEGATIVE 11/22/2018 0617   UROBILINOGEN 0.2 02/23/2013 1058   NITRITE NEGATIVE 11/22/2018  0617   LEUKOCYTESUR NEGATIVE 11/22/2018 0617   Sepsis Labs: @LABRCNTIP (procalcitonin:4,lacticidven:4)  ) Recent Results (from the past 240 hour(s))  SARS Coronavirus 2 [facility] order, Performed in [facility] hospital lab) Nasopharyngeal Nasopharyngeal Swab     Status: None   Collection Time: 11/21/18  8:09 AM   Specimen: Nasopharyngeal Swab  Result Value Ref Range Status   SARS Coronavirus 2 NEGATIVE NEGATIVE Final    Comment: (NOTE) If result is NEGATIVE SARS-CoV-2 target nucleic acids are NOT DETECTED. The SARS-CoV-2 RNA is generally detectable in upper and lower  respiratory specimens during the acute phase of infection. The lowest  concentration of SARS-CoV-2 viral copies this assay can detect is 250  copies / mL. A negative result does not preclude SARS-CoV-2 infection  and should not be used as the sole basis for treatment or other  patient management decisions.  A negative result  may occur with  improper specimen collection / handling, submission of specimen other  than nasopharyngeal swab, presence of viral mutation(s) within the  areas targeted by this assay, and inadequate number of viral copies  (<250 copies / mL). A negative result must be combined with clinical  observations, patient history, and epidemiological information. If result is POSITIVE SARS-CoV-2 target nucleic acids are DETECTED. The SARS-CoV-2 RNA is generally detectable in upper and lower  respiratory specimens dur ing the acute phase of infection.  Positive  results are indicative of active infection with SARS-CoV-2.  Clinical  correlation with patient history and other diagnostic information is  necessary to determine patient infection status.  Positive results do  not rule out bacterial infection or co-infection with other viruses. If result is PRESUMPTIVE POSTIVE SARS-CoV-2 nucleic acids MAY BE PRESENT.   A presumptive positive result was obtained on the submitted specimen  and confirmed on  repeat testing.  While 2019 novel coronavirus  (SARS-CoV-2) nucleic acids may be present in the submitted sample  additional confirmatory testing may be necessary for epidemiological  and / or clinical management purposes  to differentiate between  SARS-CoV-2 and other Sarbecovirus currently known to infect humans.  If clinically indicated additional testing with an alternate test  methodology [phone]) is advised. The SARS-CoV-2 RNA is generally  detectable in upper and lower respiratory sp ecimens during the acute  phase of infection. The expected result is Negative. Fact Sheet for Patients:  BoilerBrush.com.cy Fact Sheet for Healthcare Providers: https://pope.com/ This test is not yet approved or cleared by the [place] FDA and has been authorized for detection and/or diagnosis of SARS-CoV-2 by FDA under an Emergency Use Authorization (EUA).  This EUA will remain in effect (meaning this test can be used) for the duration of the COVID-19 declaration under Section 564(b)(1) of the Act, 21 U.S.C. section 360bbb-3(b)(1), unless the authorization is terminated or revoked sooner. Performed at [facility], [address] [place], [place] [address]   Blood Culture (routine x 2)     Status: Abnormal   Collection Time: 11/21/18  8:38 AM   Specimen: BLOOD  Result Value Ref Range Status   Specimen Description   Final    BLOOD LEFT ANTECUBITAL Performed at [facility], [address], [place], [place] [address]    Special Requests   Final    BOTTLES DRAWN AEROBIC AND ANAEROBIC Blood Culture results may not be optimal due to an excessive volume of blood received in culture bottles Performed at [facility], [address] [place], [place] [address]    Culture  Setup Time   Final    GRAM NEGATIVE RODS AEROBIC BOTTLE ONLY CRITICAL RESULT CALLED TO, READ BACK BY AND VERIFIED WITH:  Tamala Bari PHARMD, AT [phone] 11/22/18 BY Renato Shin Performed at [facility], [address], [place], [place] [address]    Culture ESCHERICHIA COLI (A)  Final   Report Status 11/24/2018 FINAL  Final   Organism ID, Bacteria ESCHERICHIA COLI  Final      Susceptibility   Escherichia coli - MIC*    AMPICILLIN <=2 SENSITIVE Sensitive     CEFAZOLIN <=4 SENSITIVE Sensitive     CEFEPIME <=1 SENSITIVE Sensitive     CEFTAZIDIME <=1 SENSITIVE Sensitive     CEFTRIAXONE <=1 SENSITIVE Sensitive     CIPROFLOXACIN <=0.25 SENSITIVE Sensitive     GENTAMICIN <=1 SENSITIVE Sensitive     IMIPENEM <=0.25 SENSITIVE Sensitive  TRIMETH/SULFA <=20 SENSITIVE Sensitive     AMPICILLIN/SULBACTAM <=2 SENSITIVE Sensitive     PIP/TAZO <=4 SENSITIVE Sensitive     Extended ESBL NEGATIVE Sensitive     * ESCHERICHIA COLI  Blood Culture ID Panel (Reflexed)     Status: Abnormal   Collection Time: 11/21/18  8:38 AM  Result Value Ref Range Status   Enterococcus species NOT DETECTED NOT DETECTED Final   Listeria monocytogenes NOT DETECTED NOT DETECTED Final   Staphylococcus species NOT DETECTED NOT DETECTED Final   Staphylococcus aureus (BCID) NOT DETECTED NOT DETECTED Final   Streptococcus species NOT DETECTED NOT DETECTED Final   Streptococcus agalactiae NOT DETECTED NOT DETECTED Final   Streptococcus pneumoniae NOT DETECTED NOT DETECTED Final   Streptococcus pyogenes NOT DETECTED NOT DETECTED Final   Acinetobacter baumannii NOT DETECTED NOT DETECTED Final   Enterobacteriaceae species DETECTED (A) NOT DETECTED Final    Comment: Enterobacteriaceae represent a large family of gram-negative bacteria, not a single organism. CRITICAL RESULT CALLED TO, READ BACK BY AND VERIFIED WITH: D. WOFFORD PHARMD, AT [id number] 11/22/18 BY D. VANHOOK    Enterobacter cloacae complex NOT DETECTED NOT DETECTED Final   Escherichia coli DETECTED (A) NOT DETECTED Final    Comment: CRITICAL RESULT CALLED TO, READ BACK BY AND VERIFIED WITH:  D. WOFFORD PHARMD, AT [phone] 11/22/18 BY D. VANHOOK    Klebsiella oxytoca NOT DETECTED NOT DETECTED Final   Klebsiella pneumoniae NOT DETECTED NOT DETECTED Final   Proteus species NOT DETECTED NOT DETECTED Final   Serratia marcescens NOT DETECTED NOT DETECTED Final   Carbapenem resistance NOT DETECTED NOT DETECTED Final   Haemophilus influenzae NOT DETECTED NOT DETECTED Final   Neisseria meningitidis NOT DETECTED NOT DETECTED Final   Pseudomonas aeruginosa NOT DETECTED NOT DETECTED Final   Candida albicans NOT DETECTED NOT DETECTED Final   Candida glabrata NOT DETECTED NOT DETECTED Final   Candida krusei NOT DETECTED NOT DETECTED Final   Candida parapsilosis NOT DETECTED NOT DETECTED Final   Candida tropicalis NOT DETECTED NOT DETECTED Final    Comment: Performed at [facility], [address] [place], [place] [address]  Blood Culture (routine x 2)     Status: Abnormal   Collection Time: 11/21/18  8:50 AM   Specimen: BLOOD LEFT FOREARM  Result Value Ref Range Status   Specimen Description   Final    BLOOD LEFT FOREARM Performed at [facility], [address] [place], [place] [address]    Special Requests   Final    BOTTLES DRAWN AEROBIC AND ANAEROBIC Blood Culture adequate volume Performed at [facility], [address] [place], [place] [address]    Culture  Setup Time   Final    GRAM NEGATIVE RODS AEROBIC BOTTLE ONLY CRITICAL VALUE NOTED.  VALUE IS CONSISTENT WITH PREVIOUSLY REPORTED AND CALLED VALUE.    Culture (A)  Final    ESCHERICHIA COLI SUSCEPTIBILITIES PERFORMED ON PREVIOUS CULTURE WITHIN THE LAST 5 DAYS. Performed at [facility], [address] [place], [place] [address]    Report Status 11/24/2018 FINAL  Final  Culture, Urine     Status: None   Collection Time: 11/22/18  6:18 AM   Specimen: Urine, Clean Catch  Result Value Ref Range Status   Specimen Description   Final    URINE, CLEAN CATCH Performed at  [facility], [address] [place], [place] [address]    Special Requests   Final  Normal Performed at [facility], [address] [place], [place] [address]    Culture   Final    NO GROWTH Performed at [facility], [address] [place], [place] [address]    Report Status 11/23/2018 FINAL  Final  Culture, blood (routine x 2)     Status: None (Preliminary result)   Collection Time: 11/23/18  4:28 AM   Specimen: BLOOD LEFT HAND  Result Value Ref Range Status   Specimen Description   Final    BLOOD LEFT HAND Performed at [facility], [address] [place], [place] [address]    Special Requests   Final    BOTTLES DRAWN AEROBIC ONLY Blood Culture results may not be optimal due to an inadequate volume of blood received in culture bottles Performed at [facility], [address] [place], [place] [address]    Culture   Final    NO GROWTH 1 DAY Performed at [facility], [address] [place], [place] [address]    Report Status PENDING  Incomplete  Culture, blood (routine x 2)     Status: None (Preliminary result)   Collection Time: 11/23/18  4:28 AM   Specimen: BLOOD  Result Value Ref Range Status   Specimen Description   Final    BLOOD LEFT ANTECUBITAL Performed at [facility], [address] [place], [place] [address]    Special Requests   Final    BOTTLES DRAWN AEROBIC ONLY Blood Culture adequate volume Performed at [facility], [address] [place], [place] [address]    Culture   Final    NO GROWTH 1 DAY Performed at [facility], [address] [place], [place] [address]    Report Status PENDING  Incomplete      Studies: No results found.  Scheduled Meds: . arformoterol  15 mcg Nebulization BID  . enoxaparin (LOVENOX) injection  40 mg Subcutaneous Q24H  . polyethylene glycol  17 g Oral Daily  . sertraline  150  mg Oral Daily  . traZODone  25 mg Oral QHS  . triamcinolone ointment   Topical BID  . umeclidinium bromide  1 puff Inhalation Daily    Continuous Infusions: . cefTRIAXone (ROCEPHIN)  IV 2 g (11/23/18 1852)     LOS: 3 days     Darlin Droparole N Hall, MD [facility] Pager [phone]  If 7PM-7AM, please contact night-coverage [email or web address] Password [email or web address] 11/24/2018, 12:15 PM

## 2018-11-25 LAB — CBC WITH DIFFERENTIAL/PLATELET
Abs Immature Granulocytes: 0.2 10*3/uL — ABNORMAL HIGH (ref 0.00–0.07)
Basophils Absolute: 0.1 10*3/uL (ref 0.0–0.1)
Basophils Relative: 1 %
Eosinophils Absolute: 0.3 10*3/uL (ref 0.0–0.5)
Eosinophils Relative: 4 %
HCT: 34 % — ABNORMAL LOW (ref 36.0–46.0)
Hemoglobin: 10.6 g/dL — ABNORMAL LOW (ref 12.0–15.0)
Immature Granulocytes: 3 %
Lymphocytes Relative: 19 %
Lymphs Abs: 1.4 10*3/uL (ref 0.7–4.0)
MCH: 29.4 pg (ref 26.0–34.0)
MCHC: 31.2 g/dL (ref 30.0–36.0)
MCV: 94.2 fL (ref 80.0–100.0)
Monocytes Absolute: 0.7 10*3/uL (ref 0.1–1.0)
Monocytes Relative: 10 %
Neutro Abs: 4.6 10*3/uL (ref 1.7–7.7)
Neutrophils Relative %: 63 %
Platelets: 488 10*3/uL — ABNORMAL HIGH (ref 150–400)
RBC: 3.61 MIL/uL — ABNORMAL LOW (ref 3.87–5.11)
RDW: 14.4 % (ref 11.5–15.5)
WBC: 7.4 10*3/uL (ref 4.0–10.5)
nRBC: 0 % (ref 0.0–0.2)

## 2018-11-25 LAB — BASIC METABOLIC PANEL
Anion gap: 9 (ref 5–15)
BUN: 8 mg/dL (ref 8–23)
CO2: 27 mmol/L (ref 22–32)
Calcium: 8.8 mg/dL — ABNORMAL LOW (ref 8.9–10.3)
Chloride: 105 mmol/L (ref 98–111)
Creatinine, Ser: 0.83 mg/dL (ref 0.44–1.00)
GFR calc Af Amer: >60 mL/min (ref >60)
GFR calc non Af Amer: >60 mL/min (ref >60)
Glucose, Bld: 109 mg/dL — ABNORMAL HIGH (ref 70–99)
Potassium: 3.4 mmol/L — ABNORMAL LOW (ref 3.5–5.1)
Sodium: 141 mmol/L (ref 135–145)

## 2018-11-25 LAB — MAGNESIUM: Magnesium: 2 mg/dL (ref 1.7–2.4)

## 2018-11-25 LAB — PHOSPHORUS: Phosphorus: 3.6 mg/dL (ref 2.5–4.6)

## 2018-11-25 LAB — PROCALCITONIN: Procalcitonin: 1.56 ng/mL

## 2018-11-25 MED ORDER — POTASSIUM CHLORIDE CRYS ER 20 MEQ PO TBCR: 40.0000 meq | EXTENDED_RELEASE_TABLET | Freq: Once | ORAL | Status: DC

## 2018-11-25 MED ORDER — LEVOFLOXACIN 500 MG PO TABS: 500.0000 mg | ORAL_TABLET | Freq: Every day | ORAL | 0 refills | Status: AC

## 2018-11-25 MED ORDER — LEVOFLOXACIN 500 MG PO TABS
500.0000 mg | ORAL_TABLET | Freq: Every day | ORAL | Status: DC
  Administered 2018-11-25: 500 mg via ORAL
  Filled 2018-11-25: qty 1

## 2018-11-25 MED ORDER — OXYCODONE HCL 5 MG PO TABS: 5.0000 mg | ORAL_TABLET | Freq: Two times a day (BID) | ORAL | 0 refills | Status: DC | PRN

## 2018-11-25 MED ORDER — POLYETHYLENE GLYCOL 3350 17 G PO PACK: 17.0000 g | PACK | Freq: Every day | ORAL | 0 refills | Status: DC

## 2018-11-25 MED ORDER — ALBUTEROL SULFATE (2.5 MG/3ML) 0.083% IN NEBU: 2.5000 mg | INHALATION_SOLUTION | Freq: Two times a day (BID) | RESPIRATORY_TRACT | 0 refills | Status: DC | PRN

## 2018-11-25 NOTE — Progress Notes (Signed)
Pt fiance at bedside with friend waiting in car, pt wanting to discharge now without waiting for PT/OT evaluation or Neb machine being delivered by CM. Dr Nevada Crane notified, orders received for pt to d/c

## 2018-11-25 NOTE — Discharge Summary (Signed)
Discharge Summary  Jody Mcclure:[id number] DOB: 1956/03/04  PCP: Thom Chimes, MD  Admit date: 11/21/2018 Discharge date: 11/25/2018  Time spent: 35 minutes  Recommendations for Outpatient Follow-up:  1. Please follow-up with your primary care provider 2. Take your medications as prescribed 3. Please abstain from polysubstance abuse  Discharge Diagnoses:  Active Hospital Problems   Diagnosis Date Noted   Sepsis (HCC) 11/21/2018   Fever and chills     Resolved Hospital Problems  No resolved problems to display.    Discharge Condition: Stable  Diet recommendation: Previous diet  Vitals:   11/25/18 0536 11/25/18 0846  BP: (!) 143/85   Pulse: 66   Resp: 18   Temp: 98.6 F (37 C)   SpO2: 93% 93%    History of present illness:   Jody Mcclure a 63 y.o.femalewith medical history significant of anxiety, depression, HTN, HLD presenting with general malaise over the past week.  She notes that she started have subjective fever and chills. She also just noticed shaking chills. Her symptoms have been present over the past week, but have gotten worse over the past day or so. She denies cough, SOB, CP, abdominal pain, dysuria or UTI symptoms. She denies sick contacts. Denies smoking. She does drink alcohol. Also hx of cocaine use.    Admitted for sepsis secondary to acute bilateral pyelonephritis complicated by E. coli bacteremia.  Blood culture x2 drawn on 11/21/2018 grew E. coli 2 out of 2 bottles pansensitive.  Completed 3 days of IV Rocephin.  Switched to p.o. Levaquin 500 mg daily on 11/25/18  x 7 days.  11/25/18: Patient was seen and examined at bedside this morning.  No acute events overnight.  Intermittent lower abdominal pain.  No nausea.  Vital signs and labs reviewed and are stable.  Patient is medically stable for discharge.    Hospital Course:  Active Problems:   Sepsis (HCC)   Fever and chills  Resolving sepsis secondary to acute bilateral  pyelonephritis and E. coli bacteremia, pansensitive Presented with fever with T-max 102.4, leukocytosis with WBC 15K, tachypnea with respiration rate of 27 and generalized weakness. CTA PE, CT abdomen and pelvis with contrast showed bilateral hydronephrosis, UA with pyuria, urine culture collected on 11/22/2018 shows no growth. Blood cultures drawn on 11/21/2018 grew E. coli 2/2 bottles, pansensitive Initially on cefepime, changed to Rocephin 2 g daily by pharmacy on 11/22/18 Procalcitonin worsening from 2.34 to 2.93 on 11/24/2018 Repeat procalcitonin 1.56 on 11/25/2018, afebrile x48 hours with no leukocytosis on 11/25/2018. Oxycodone twice daily as needed for severe pain  E. coli bacteremia Management as stated above Urine culture collected post antibiotics on 11/22/2018 no growth ProCalcitonin 2.34 on 09/23/2018>> 2.93 on 11/24/2018 >> procalcitonin 1.56 on 11/25/2018. Afebrile x48 hours, no leukocytosis.  Hypokalemia, repleted Potassium 3.4 Repleted with KCl 40 mEq Follow-up with your PCP  History of hypertension Blood pressures currently at goal with no antihypertensive medications.  Normocytic anemia with drop in hemoglobin, resolved Baseline hemoglobin appears to be 14 K Presented with hemoglobin of 11.9 >>9.2>> hemoglobin improved to 10.6 on 11/25/2018 Hemoglobin dropped to 9.7 on 11/22/2018, likely dilutional in the setting of IV fluid hydration Encourage oral intake, DC IV fluid 11/23/2018. No sign of overt bleed  COPD Quit smoking 10 months ago Continue to abstain from tobacco use Use albuterol nebulizer twice daily as needed for wheezing Follow-up with your primary care provider   Code Status:Full Code    Procedures:  None  Consultations:  None  Discharge Exam: BP (!) 143/85 (BP Location: Right Arm)    Pulse 66    Temp 98.6 F (37 C) (Oral)    Resp 18    Ht 5' (1.524 m)    Wt 57.1 kg    SpO2 93%    BMI 24.58 kg/m   General: 63 y.o. year-old female well  developed well nourished in no acute distress.  Alert and oriented x3.  Cardiovascular: Regular rate and rhythm with no rubs or gallops.  No thyromegaly or JVD noted.    Respiratory: Clear to auscultation with no wheezes or rales. Good inspiratory effort.  Abdomen: Soft nontender at the time of this exam.  Nondistended with normal bowel sounds x4 quadrants.  Musculoskeletal: No lower extremity edema. 2/4 pulses in all 4 extremities.  Psychiatry: Mood is appropriate for condition and setting  Discharge Instructions You were cared for by a hospitalist during your hospital stay. If you have any questions about your discharge medications or the care you received while you were in the hospital after you are discharged, you can call the unit and asked to speak with the hospitalist on call if the hospitalist that took care of you is not available. Once you are discharged, your primary care physician will handle any further medical issues. Please note that NO REFILLS for any discharge medications will be authorized once you are discharged, as it is imperative that you return to your primary care physician (or establish a relationship with a primary care physician if you do not have one) for your aftercare needs so that they can reassess your need for medications and monitor your lab values.   Allergies as of 11/25/2018      Reactions   Codeine Nausea And Vomiting   Sulfa Antibiotics Rash      Medication List    STOP taking these medications   cyclobenzaprine 5 MG tablet Commonly known as: FLEXERIL   hydrOXYzine 10 MG tablet Commonly known as: ATARAX/VISTARIL   predniSONE 10 MG (21) Tbpk tablet Commonly known as: STERAPRED UNI-PAK 21 TAB   senna-docusate 8.6-50 MG tablet Commonly known as: Senokot-S     TAKE these medications   albuterol (2.5 MG/3ML) 0.083% nebulizer solution Commonly known as: PROVENTIL Take 3 mLs (2.5 mg total) by nebulization 2 (two) times daily as needed for  wheezing or shortness of breath.   amLODipine 5 MG tablet Commonly known as: NORVASC TAKE 1 TABLET BY MOUTH EVERY DAY   betamethasone valerate ointment 0.1 % Commonly known as: VALISONE APPLY 1 APPLICATION TOPICALLY 2 (TWO) TIMES DAILY.   Depend Easy Fit Undergarments Misc 1 Units by Does not apply route 3 (three) times daily.   levofloxacin 500 MG tablet Commonly known as: LEVAQUIN Take 1 tablet (500 mg total) by mouth daily for 7 days. Start taking on: November 26, 2018   oxyCODONE 5 MG immediate release tablet Commonly known as: Oxy IR/ROXICODONE Take 1 tablet (5 mg total) by mouth 2 (two) times daily as needed for severe pain or breakthrough pain.   polyethylene glycol 17 g packet Commonly known as: MIRALAX / GLYCOLAX Take 17 g by mouth daily. Start taking on: November 26, 2018   sertraline 50 MG tablet Commonly known as: ZOLOFT TAKE 3 TABLETS BY MOUTH EVERY DAY   Tiotropium Bromide-Olodaterol 2.5-2.5 MCG/ACT Aers Commonly known as: Stiolto Respimat Inhale 2 puffs into the lungs daily.            Durable Medical Equipment  (From admission, onward)  Start     Ordered   11/25/18 1010  For home use only DME Nebulizer machine  Once    Question Answer Comment  Patient needs a nebulizer to treat with the following condition COPD (chronic obstructive pulmonary disease) (HCC)   Length of Need 6 Months      11/25/18 1009         Allergies  Allergen Reactions   Codeine Nausea And Vomiting   Sulfa Antibiotics Rash   Follow-up Information    Thom Chimes, MD. Call in 1 day(s).   Specialty: Internal Medicine Why: Please call for a post hospital follow-up appointment. Contact information: [address] [place] [address] [phone]            The results of significant diagnostics from this hospitalization (including imaging, microbiology, ancillary and laboratory) are listed below for reference.    Significant Diagnostic  Studies: Dg Chest 2 View  Result Date: 10/29/2018 CLINICAL DATA:  Left lateral chest pain.  No known injury. EXAM: CHEST - 2 VIEW COMPARISON:  02/05/2018 FINDINGS: Vague density noted at the left lung base overlying the left 10th rib. It is unclear if this could represent healing 10th rib fracture or left base atelectasis. Lungs appear clear on the lateral view. Heart is normal size. No effusions. No acute bony abnormality. IMPRESSION: Vague density projects over the left lung base and the posterior left 10th rib, question healing fracture. This could be further evaluated with chest CT if felt clinically indicated. Electronically Signed   By: Charlett Nose M.D.   On: 10/29/2018 11:14   Ct Angio Chest Pe W Or Wo Contrast  Result Date: 11/21/2018 CLINICAL DATA:  Fever of unknown origin. Dyspnea. Generalized weakness. Nausea and vomiting. EXAM: CT ANGIOGRAPHY CHEST CT ABDOMEN AND PELVIS WITH CONTRAST TECHNIQUE: Multidetector CT imaging of the chest was performed using the standard protocol during bolus administration of intravenous contrast. Multiplanar CT image reconstructions and MIPs were obtained to evaluate the vascular anatomy. Multidetector CT imaging of the abdomen and pelvis was performed using the standard protocol during bolus administration of intravenous contrast. CONTRAST:  OMNIPAQUE IOHEXOL 350 MG/ML SOLN COMPARISON:  08/06/2009 CT chest, abdomen and pelvis. FINDINGS: CTA CHEST FINDINGS Cardiovascular: The study is low to moderate quality for the evaluation of pulmonary embolism, degraded by motion. There are no convincing filling defects in the central, lobar, segmental or subsegmental pulmonary artery branches to suggest acute pulmonary embolism. Atherosclerotic nonaneurysmal thoracic aorta. Normal caliber main pulmonary artery. Normal heart size. No significant pericardial fluid/thickening. Left anterior descending and left circumflex coronary atherosclerosis. Mediastinum/Nodes: No  discrete thyroid nodules. Unremarkable esophagus. No pathologically enlarged axillary, mediastinal or hilar lymph nodes. Lungs/Pleura: No pneumothorax. No pleural effusion. No acute consolidative airspace disease, lung masses or significant pulmonary nodules. Musculoskeletal: No aggressive appearing focal osseous lesions. Mild T6 vertebral compression fracture is new since 2011 CT, of indeterminate chronicity. Moderate thoracic spondylosis. Review of the MIP images confirms the above findings. CT ABDOMEN and PELVIS FINDINGS Hepatobiliary: Normal liver size. Hypodense right liver dome 1.1 cm lesion (series 11/image 16), stable in size since 08/06/2009 CT, considered benign. Progressively enhancing 1.3 cm inferior right liver lesion (series 16/image 25), stable in size since 2011 CT, compatible with a benign hemangioma. Subcentimeter hypodense segment 3 left liver lobe lesion is too small to characterize, not definitely seen on prior CT, for which no follow-up is required unless the patient has risk factors for liver malignancy. Scattered granulomatous liver calcifications. Cholelithiasis. No  gallbladder wall thickening or pericholecystic fluid. No biliary ductal dilatation. Pancreas: Normal, with no mass or duct dilation. Spleen: Normal size. No mass. Adrenals/Urinary Tract: Normal adrenals. Patchy bilateral contrast nephrograms with hazy perinephric fat, compatible with acute bilateral pyelonephritis. No renal masses or perinephric collections. No hydronephrosis. Normal bladder. Stomach/Bowel: Small hiatal hernia. Otherwise normal nondistended stomach. Normal caliber small bowel with no small bowel wall thickening. Normal appendix. Normal large bowel with no diverticulosis, large bowel wall thickening or pericolonic fat stranding. Vascular/Lymphatic: Atherosclerotic nonaneurysmal abdominal aorta. Patent portal, splenic, hepatic and renal veins. No pathologically enlarged lymph nodes in the abdomen or pelvis.  Reproductive: Status post hysterectomy, with no abnormal findings at the vaginal cuff. Cystic 3.0 x 2.8 cm right adnexal lesion with suggestion of thin internal septation (series 11/image 65), which may represent growth of previously visualized 2.3 x 1.7 cm right adnexal cystic lesion on 2011 CT. No left adnexal mass. Other: No pneumoperitoneum, ascites or focal fluid collection. Musculoskeletal: No aggressive appearing focal osseous lesions. Marked lumbar spondylosis and facet arthropathy with chronic 9 mm anterolisthesis at L4-5. Review of the MIP images confirms the above findings. IMPRESSION: CT CHEST: 1. Limited motion degraded scan. No evidence of pulmonary embolism. No active pulmonary disease. 2. Two vessel coronary atherosclerosis. 3. Mild T6 vertebral compression fracture, new since 2011 CT, of indeterminate chronicity, chronic appearing. CT ABDOMEN / PELVIS: 1. CT findings suggest acute bilateral pyelonephritis. No evidence of renal abscess. No hydronephrosis. 2. Septated 3.0 cm right adnexal cystic lesion, mildly increased from 2011 CT. Suggest nonemergent outpatient pelvic ultrasound for further evaluation. 3. Cholelithiasis. 4. Small hiatal hernia. 5.  Aortic Atherosclerosis (ICD10-I70.0). Electronically Signed   By: Delbert PhenixJason A Poff M.D.   On: 11/21/2018 17:23   Ct Abdomen Pelvis W Contrast  Result Date: 11/21/2018 CLINICAL DATA:  Fever of unknown origin. Dyspnea. Generalized weakness. Nausea and vomiting. EXAM: CT ANGIOGRAPHY CHEST CT ABDOMEN AND PELVIS WITH CONTRAST TECHNIQUE: Multidetector CT imaging of the chest was performed using the standard protocol during bolus administration of intravenous contrast. Multiplanar CT image reconstructions and MIPs were obtained to evaluate the vascular anatomy. Multidetector CT imaging of the abdomen and pelvis was performed using the standard protocol during bolus administration of intravenous contrast. CONTRAST:  100mL OMNIPAQUE IOHEXOL 350 MG/ML SOLN  COMPARISON:  08/06/2009 CT chest, abdomen and pelvis. FINDINGS: CTA CHEST FINDINGS Cardiovascular: The study is low to moderate quality for the evaluation of pulmonary embolism, degraded by motion. There are no convincing filling defects in the central, lobar, segmental or subsegmental pulmonary artery branches to suggest acute pulmonary embolism. Atherosclerotic nonaneurysmal thoracic aorta. Normal caliber main pulmonary artery. Normal heart size. No significant pericardial fluid/thickening. Left anterior descending and left circumflex coronary atherosclerosis. Mediastinum/Nodes: No discrete thyroid nodules. Unremarkable esophagus. No pathologically enlarged axillary, mediastinal or hilar lymph nodes. Lungs/Pleura: No pneumothorax. No pleural effusion. No acute consolidative airspace disease, lung masses or significant pulmonary nodules. Musculoskeletal: No aggressive appearing focal osseous lesions. Mild T6 vertebral compression fracture is new since 2011 CT, of indeterminate chronicity. Moderate thoracic spondylosis. Review of the MIP images confirms the above findings. CT ABDOMEN and PELVIS FINDINGS Hepatobiliary: Normal liver size. Hypodense right liver dome 1.1 cm lesion (series 11/image 16), stable in size since 08/06/2009 CT, considered benign. Progressively enhancing 1.3 cm inferior right liver lesion (series 16/image 25), stable in size since 2011 CT, compatible with a benign hemangioma. Subcentimeter hypodense segment 3 left liver lobe lesion is too small to characterize, not definitely seen on prior CT, for which  no follow-up is required unless the patient has risk factors for liver malignancy. Scattered granulomatous liver calcifications. Cholelithiasis. No gallbladder wall thickening or pericholecystic fluid. No biliary ductal dilatation. Pancreas: Normal, with no mass or duct dilation. Spleen: Normal size. No mass. Adrenals/Urinary Tract: Normal adrenals. Patchy bilateral contrast nephrograms with  hazy perinephric fat, compatible with acute bilateral pyelonephritis. No renal masses or perinephric collections. No hydronephrosis. Normal bladder. Stomach/Bowel: Small hiatal hernia. Otherwise normal nondistended stomach. Normal caliber small bowel with no small bowel wall thickening. Normal appendix. Normal large bowel with no diverticulosis, large bowel wall thickening or pericolonic fat stranding. Vascular/Lymphatic: Atherosclerotic nonaneurysmal abdominal aorta. Patent portal, splenic, hepatic and renal veins. No pathologically enlarged lymph nodes in the abdomen or pelvis. Reproductive: Status post hysterectomy, with no abnormal findings at the vaginal cuff. Cystic 3.0 x 2.8 cm right adnexal lesion with suggestion of thin internal septation (series 11/image 65), which may represent growth of previously visualized 2.3 x 1.7 cm right adnexal cystic lesion on 2011 CT. No left adnexal mass. Other: No pneumoperitoneum, ascites or focal fluid collection. Musculoskeletal: No aggressive appearing focal osseous lesions. Marked lumbar spondylosis and facet arthropathy with chronic 9 mm anterolisthesis at L4-5. Review of the MIP images confirms the above findings. IMPRESSION: CT CHEST: 1. Limited motion degraded scan. No evidence of pulmonary embolism. No active pulmonary disease. 2. Two vessel coronary atherosclerosis. 3. Mild T6 vertebral compression fracture, new since 2011 CT, of indeterminate chronicity, chronic appearing. CT ABDOMEN / PELVIS: 1. CT findings suggest acute bilateral pyelonephritis. No evidence of renal abscess. No hydronephrosis. 2. Septated 3.0 cm right adnexal cystic lesion, mildly increased from 2011 CT. Suggest nonemergent outpatient pelvic ultrasound for further evaluation. 3. Cholelithiasis. 4. Small hiatal hernia. 5.  Aortic Atherosclerosis (ICD10-I70.0). Electronically Signed   By: Ilona Sorrel M.D.   On: 11/21/2018 17:23   Dg Chest Port 1 View  Result Date: 11/21/2018 CLINICAL DATA:   Fever and chills for 1 week. EXAM: PORTABLE CHEST 1 VIEW COMPARISON:  October 29, 2018 FINDINGS: The heart size and mediastinal contours are within normal limits. Both lungs are clear. The visualized skeletal structures are stable. 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 IMPRESSION: No acute cardiopulmonary disease identified. Electronically Signed   By: Abelardo Diesel M.D.   On: 11/21/2018 08:55    Microbiology: Recent Results (from the past 240 hour(s))  SARS Coronavirus 2 [facility] order, Performed in [facility] hospital lab) Nasopharyngeal Nasopharyngeal Swab     Status: None   Collection Time: 11/21/18  8:09 AM   Specimen: Nasopharyngeal Swab  Result Value Ref Range Status   SARS Coronavirus 2 NEGATIVE NEGATIVE Final    Comment: (NOTE) If result is NEGATIVE SARS-CoV-2 target nucleic acids are NOT DETECTED. The SARS-CoV-2 RNA is generally detectable in upper and lower  respiratory specimens during the acute phase of infection. The lowest  concentration of SARS-CoV-2 viral copies this assay can detect is 250  copies / mL. A negative result does not preclude SARS-CoV-2 infection  and should not be used as the sole basis for treatment or other  patient management decisions.  A negative result may occur with  improper specimen collection / handling, submission of specimen other  than nasopharyngeal swab, presence of viral mutation(s) within the  areas targeted by this assay, and inadequate number of viral copies  (<250 copies / mL). A negative result must be combined with clinical  observations, patient history, and epidemiological information. If result is POSITIVE SARS-CoV-2 target  nucleic acids are DETECTED. The SARS-CoV-2 RNA is generally detectable in upper and lower  respiratory specimens dur ing the acute phase of infection.  Positive  results are indicative of active infection with SARS-CoV-2.  Clinical  correlation with patient history and other  diagnostic information is  necessary to determine patient infection status.  Positive results do  not rule out bacterial infection or co-infection with other viruses. If result is PRESUMPTIVE POSTIVE SARS-CoV-2 nucleic acids MAY BE PRESENT.   A presumptive positive result was obtained on the submitted specimen  and confirmed on repeat testing.  While 2019 novel coronavirus  (SARS-CoV-2) nucleic acids may be present in the submitted sample  additional confirmatory testing may be necessary for epidemiological  and / or clinical management purposes  to differentiate between  SARS-CoV-2 and other Sarbecovirus currently known to infect humans.  If clinically indicated additional testing with an alternate test  methodology [id number]) is advised. The SARS-CoV-2 RNA is generally  detectable in upper and lower respiratory sp ecimens during the acute  phase of infection. The expected result is Negative. Fact Sheet for Patients:  BoilerBrush.com.cyhttps://www.fda.gov/media/136312/download Fact Sheet for Healthcare Providers: https://pope.com/https://www.fda.gov/media/136313/download This test is not yet approved or cleared by the [place] FDA and has been authorized for detection and/or diagnosis of SARS-CoV-2 by FDA under an Emergency Use Authorization (EUA).  This EUA will remain in effect (meaning this test can be used) for the duration of the COVID-19 declaration under Section 564(b)(1) of the Act, 21 U.S.C. section 360bbb-3(b)(1), unless the authorization is terminated or revoked sooner. Performed at [facility], [address] [place], [place] [address]   Blood Culture (routine x 2)     Status: Abnormal   Collection Time: 11/21/18  8:38 AM   Specimen: BLOOD  Result Value Ref Range Status   Specimen Description   Final    BLOOD LEFT ANTECUBITAL Performed at [facility], [address] [place], [place] [address]    Special Requests   Final    BOTTLES DRAWN AEROBIC AND  ANAEROBIC Blood Culture results may not be optimal due to an excessive volume of blood received in culture bottles Performed at [facility], [address] [place], [place] [address]    Culture  Setup Time   Final    GRAM NEGATIVE RODS AEROBIC BOTTLE ONLY CRITICAL RESULT CALLED TO, READ BACK BY AND VERIFIED WITH: Tamala BariD. WOFFORD PHARMD, AT [id number] 11/22/18 BY Renato Shin. VANHOOK Performed at [facility], [address] [place], [place] [address]    Culture ESCHERICHIA COLI (A)  Final   Report Status 11/24/2018 FINAL  Final   Organism ID, Bacteria ESCHERICHIA COLI  Final      Susceptibility   Escherichia coli - MIC*    AMPICILLIN <=2 SENSITIVE Sensitive     CEFAZOLIN <=4 SENSITIVE Sensitive     CEFEPIME <=1 SENSITIVE Sensitive     CEFTAZIDIME <=1 SENSITIVE Sensitive     CEFTRIAXONE <=1 SENSITIVE Sensitive     CIPROFLOXACIN <=0.25 SENSITIVE Sensitive     GENTAMICIN <=1 SENSITIVE Sensitive     IMIPENEM <=0.25 SENSITIVE Sensitive     TRIMETH/SULFA <=20 SENSITIVE Sensitive     AMPICILLIN/SULBACTAM <=2 SENSITIVE Sensitive     PIP/TAZO <=4 SENSITIVE Sensitive     Extended ESBL NEGATIVE Sensitive     * ESCHERICHIA COLI  Blood Culture ID Panel (Reflexed)     Status: Abnormal   Collection Time: 11/21/18  8:38 AM  Result Value Ref  Range Status   Enterococcus species NOT DETECTED NOT DETECTED Final   Listeria monocytogenes NOT DETECTED NOT DETECTED Final   Staphylococcus species NOT DETECTED NOT DETECTED Final   Staphylococcus aureus (BCID) NOT DETECTED NOT DETECTED Final   Streptococcus species NOT DETECTED NOT DETECTED Final   Streptococcus agalactiae NOT DETECTED NOT DETECTED Final   Streptococcus pneumoniae NOT DETECTED NOT DETECTED Final   Streptococcus pyogenes NOT DETECTED NOT DETECTED Final   Acinetobacter baumannii NOT DETECTED NOT DETECTED Final   Enterobacteriaceae species DETECTED (A) NOT DETECTED Final    Comment: Enterobacteriaceae represent a large family of  gram-negative bacteria, not a single organism. CRITICAL RESULT CALLED TO, READ BACK BY AND VERIFIED WITH: D. WOFFORD PHARMD, AT 1610 11/22/18 BY D. VANHOOK    Enterobacter cloacae complex NOT DETECTED NOT DETECTED Final   Escherichia coli DETECTED (A) NOT DETECTED Final    Comment: CRITICAL RESULT CALLED TO, READ BACK BY AND VERIFIED WITH: D. WOFFORD PHARMD, AT 9604 11/22/18 BY D. VANHOOK    Klebsiella oxytoca NOT DETECTED NOT DETECTED Final   Klebsiella pneumoniae NOT DETECTED NOT DETECTED Final   Proteus species NOT DETECTED NOT DETECTED Final   Serratia marcescens NOT DETECTED NOT DETECTED Final   Carbapenem resistance NOT DETECTED NOT DETECTED Final   Haemophilus influenzae NOT DETECTED NOT DETECTED Final   Neisseria meningitidis NOT DETECTED NOT DETECTED Final   Pseudomonas aeruginosa NOT DETECTED NOT DETECTED Final   Candida albicans NOT DETECTED NOT DETECTED Final   Candida glabrata NOT DETECTED NOT DETECTED Final   Candida krusei NOT DETECTED NOT DETECTED Final   Candida parapsilosis NOT DETECTED NOT DETECTED Final   Candida tropicalis NOT DETECTED NOT DETECTED Final    Comment: Performed at [facility], [address], [place], [place] [address]  Blood Culture (routine x 2)     Status: Abnormal   Collection Time: 11/21/18  8:50 AM   Specimen: BLOOD LEFT FOREARM  Result Value Ref Range Status   Specimen Description   Final    BLOOD LEFT FOREARM Performed at [facility], [address], [place], [place] [address]    Special Requests   Final    BOTTLES DRAWN AEROBIC AND ANAEROBIC Blood Culture adequate volume Performed at [facility], [address], [place], [place] [address]    Culture  Setup Time   Final    GRAM NEGATIVE RODS AEROBIC BOTTLE ONLY CRITICAL VALUE NOTED.  VALUE IS CONSISTENT WITH PREVIOUSLY REPORTED AND CALLED VALUE.    Culture (A)  Final    ESCHERICHIA COLI SUSCEPTIBILITIES PERFORMED ON PREVIOUS CULTURE  WITHIN THE LAST 5 DAYS. Performed at [facility], [address], [place], [place] [address]    Report Status 11/24/2018 FINAL  Final  Culture, Urine     Status: None   Collection Time: 11/22/18  6:18 AM   Specimen: Urine, Clean Catch  Result Value Ref Range Status   Specimen Description   Final    URINE, CLEAN CATCH Performed at [facility], [address] [place], [place] [address]    Special Requests   Final    Normal Performed at [facility], [address], [place], [place] [address]    Culture   Final    NO GROWTH Performed at [facility], [address] [place], [place] [address]    Report Status 11/23/2018 FINAL  Final  Culture, blood (routine x 2)     Status:  None (Preliminary result)   Collection Time: 11/23/18  4:28 AM   Specimen: BLOOD LEFT HAND  Result Value Ref Range Status   Specimen Description   Final    BLOOD LEFT HAND Performed at [facility], [address] [place], [place] [address]    Special Requests   Final    BOTTLES DRAWN AEROBIC ONLY Blood Culture results may not be optimal due to an inadequate volume of blood received in culture bottles Performed at [facility], [address] [place], [place] [address]    Culture   Final    NO GROWTH 2 DAYS Performed at [facility], [address] [place], [place] [address]    Report Status PENDING  Incomplete  Culture, blood (routine x 2)     Status: None (Preliminary result)   Collection Time: 11/23/18  4:28 AM   Specimen: BLOOD  Result Value Ref Range Status   Specimen Description   Final    BLOOD LEFT ANTECUBITAL Performed at [facility], [address] [place], [place] [address]    Special Requests   Final    BOTTLES DRAWN AEROBIC ONLY Blood Culture adequate volume Performed at [facility], [address] [place], [place] [address]    Culture   Final     NO GROWTH 2 DAYS Performed at [facility], [address] [place], [place] [address]    Report Status PENDING  Incomplete     Labs: Basic Metabolic Panel: Recent Labs  Lab 11/21/18 0838 11/21/18 1932 11/22/18 0342 11/23/18 0720 11/25/18 0507  NA 135  --  139 140 141  K 2.7*  --  3.5 3.6 3.4*  CL 101  --  112* 108 105  CO2 22  --  21* 24 27  GLUCOSE 134*  --  117* 107* 109*  BUN 10  --  8 <5* 8  CREATININE 0.94 0.82 0.79 0.82 0.83  CALCIUM 8.3*  --  7.8* 8.1* 8.8*  MG 1.7  --   --   --  2.0  PHOS  --   --   --   --  3.6   Liver Function Tests: Recent Labs  Lab 11/21/18 0838 11/22/18 0342  AST 51* 29  ALT 51* 39  ALKPHOS 107 92  BILITOT 1.3* 0.7  PROT 7.7 6.7  ALBUMIN 3.2* 2.7*   No results for input(s): LIPASE, AMYLASE in the last 168 hours. No results for input(s): AMMONIA in the last 168 hours. CBC: Recent Labs  Lab 11/21/18 0838 11/21/18 1932 11/22/18 0342 11/23/18 0720 11/25/18 0507  WBC 15.6* 11.8* 12.7* 9.0 7.4  NEUTROABS 12.4*  --   --  6.6 4.6  HGB 11.9* 9.4* 9.7* 9.2* 10.6*  HCT 35.9* 29.7* 31.5* 29.6* 34.0*  MCV 90.9 92.8 95.5 95.2 94.2  PLT 278 226 264 321 488*   Cardiac Enzymes: No results for input(s): CKTOTAL, CKMB, CKMBINDEX, TROPONINI in the last 168 hours. BNP: BNP (last 3 results) No results for input(s): BNP in the last 8760 hours.  ProBNP (last 3 results) No results for input(s): PROBNP in the last 8760 hours.  CBG: No results for input(s): GLUCAP in the last 168 hours.     Signed:  Darlin Droparole N Kayl Stogdill, MD [facility] 11/25/2018, 1:26 PM

## 2018-11-25 NOTE — TOC Initial Note (Signed)
Transition of Care [facility]) - Initial/Assessment Note    Patient Details  Name: Jody Mcclure MRN: [id number] Date of Birth: Jun 07, 1955  [facility] of [facility]) CM/SW Contact:    Joaquin Courts, RN Phone Number: 11/25/2018, 2:01 PM  Clinical Narrative:      [employer] arranged to bring nebulizer machine for home use, patient is refusing to wait for equipment to be delivered. Patient is aware that shipping the equipment will delay arrival as it will not get there for three days.  Patient is ok with the delay as she will not wait for equipment delivery to bedside.              Expected Discharge Plan: Home/Self Care Barriers to Discharge: No Barriers Identified   Patient Goals and CMS Choice Patient states their goals for this hospitalization and ongoing recovery are:: to go home      Expected Discharge Plan and Services Expected Discharge Plan: Home/Self Care   Discharge Planning Services: CM Consult   Living arrangements for the past 2 months: Apartment Expected Discharge Date: 11/25/18               DME Arranged: Nebulizer machine DME Agency: [employer] Date DME Agency Contacted: 11/25/18 Time DME Agency Contacted: [id number] Representative spoke with at DME Agency: [place]: NA [place] Agency: NA        Prior Living Arrangements/Services Living arrangements for the past 2 months: [place] with:: Self Patient language and need for interpreter reviewed:: Yes Do you feel safe going back to the place where you live?: Yes      Need for Family Participation in Patient Care: Yes (Comment) Care giver support system in place?: Yes (comment)   Criminal Activity/Legal Involvement Pertinent to Current Situation/Hospitalization: No - Comment as needed  Activities of Daily Living Home Assistive Devices/Equipment: None ADL Screening (condition at time of admission) Patient's cognitive ability adequate to safely complete daily activities?: No Is the patient deaf or have  difficulty hearing?: No Does the patient have difficulty seeing, even when wearing glasses/contacts?: No Does the patient have difficulty concentrating, remembering, or making decisions?: No Patient able to express need for assistance with ADLs?: No Does the patient have difficulty dressing or bathing?: No Independently performs ADLs?: Yes (appropriate for developmental age) Does the patient have difficulty walking or climbing stairs?: No Weakness of Legs: None Weakness of Arms/Hands: None  Permission Sought/Granted                  Emotional Assessment Appearance:: Appears stated age Attitude/Demeanor/Rapport: Engaged Affect (typically observed): Accepting Orientation: : Oriented to Self, Oriented to Place, Oriented to  Time, Oriented to Situation   Psych Involvement: No (comment)  Admission diagnosis:  Hypokalemia [E87.6] Fever and chills [R50.9] Weakness [R53.1] Patient Active Problem List   Diagnosis Date Noted  . Sepsis ([place]) 11/21/2018  . Fever and chills   . Stress incontinence 04/01/2018  . Dyspnea on exertion 03/05/2018  . Mild dementia ([place]) 11/15/2017  . Constipation 11/15/2017  . Encounter for screening mammogram for breast cancer 06/03/2016  . Lumbar radiculopathy, chronic 06/03/2016  . Generalized anxiety disorder 06/03/2016  . Healthcare maintenance 06/03/2016  . GERD (gastroesophageal reflux disease) 12/13/2012  . Depression 12/13/2012  . Dysphagia 04/18/2012  . Hypertension 08/15/2011  . Rash and nonspecific skin eruption 08/15/2011   PCP:  Ladona Horns, MD Pharmacy:   [id number] [place], [place] [address] Phone: [phone]  Fax: [phone]  [facility] [address] [place] [address] Phone: [phone] Fax: [phone]     Social Determinants of Health (SDOH) Interventions    Readmission Risk Interventions No  flowsheet data found.

## 2018-11-25 NOTE — Discharge Instructions (Signed)
COPD and Physical Activity °Chronic obstructive pulmonary disease (COPD) is a long-term (chronic) condition that affects the lungs. COPD is a general term that can be used to describe many different lung problems that cause lung swelling (inflammation) and limit airflow, including chronic bronchitis and emphysema. °The main symptom of COPD is shortness of breath, which makes it harder to do even simple tasks. This can also make it harder to exercise and be active. Talk with your health care provider about treatments to help you breathe better and actions you can take to prevent breathing problems during physical activity. °What are the benefits of exercising with COPD? °Exercising regularly is an important part of a healthy lifestyle. You can still exercise and do physical activities even though you have COPD. Exercise and physical activity improve your shortness of breath by increasing blood flow (circulation). This causes your heart to pump more oxygen through your body. Moderate exercise can improve your: °· Oxygen use. °· Energy level. °· Shortness of breath. °· Strength in your breathing muscles. °· Heart health. °· Sleep. °· Self-esteem and feelings of self-worth. °· Depression, stress, and anxiety levels. °Exercise can benefit everyone with COPD. The severity of your disease may affect how hard you can exercise, especially at first, but everyone can benefit. Talk with your health care provider about how much exercise is safe for you, and which activities and exercises are safe for you. °What actions can I take to prevent breathing problems during physical activity? °· Sign up for a pulmonary rehabilitation program. This type of program may include: °? Education about lung diseases. °? Exercise classes that teach you how to exercise and be more active while improving your breathing. This usually involves: °§ Exercise using your lower extremities, such as a stationary bicycle. °§ About 30 minutes of exercise, 2  to 5 times per week, for 6 to 12 weeks °§ Strength training, such as push ups or leg lifts. °? Nutrition education. °? Group classes in which you can talk with others who also have COPD and learn ways to manage stress. °· If you use an oxygen tank, you should use it while you exercise. Work with your health care provider to adjust your oxygen for your physical activity. Your resting flow rate is different from your flow rate during physical activity. °· While you are exercising: °? Take slow breaths. °? Pace yourself and do not try to go too fast. °? Purse your lips while breathing out. Pursing your lips is similar to a kissing or whistling position. °? If doing exercise that uses a quick burst of effort, such as weight lifting: °§ Breathe in before starting the exercise. °§ Breathe out during the hardest part of the exercise (such as raising the weights). °Where to find support °You can find support for exercising with COPD from: °· Your health care provider. °· A pulmonary rehabilitation program. °· Your local health department or community health programs. °· Support groups, online or in-person. Your health care provider may be able to recommend support groups. °Where to find more information °You can find more information about exercising with COPD from: °· American Lung Association: lung.org. °· COPD Foundation: copdfoundation.org. °Contact a health care provider if: °· Your symptoms get worse. °· You have chest pain. °· You have nausea. °· You have a fever. °· You have trouble talking or catching your breath. °· You want to start a new exercise program or a new activity. °Summary °· COPD is a general term that can   be used to describe many different lung problems that cause lung swelling (inflammation) and limit airflow. This includes chronic bronchitis and emphysema.  Exercise and physical activity improve your shortness of breath by increasing blood flow (circulation). This causes your heart to provide more  oxygen to your body.  Contact your health care provider before starting any exercise program or new activity. Ask your health care provider what exercises and activities are safe for you. This information is not intended to replace advice given to you by your health care provider. Make sure you discuss any questions you have with your health care provider. Document Released: 04/20/2017 Document Revised: 07/18/2018 Document Reviewed: 04/20/2017 Elsevier Patient Education  [place]. Bacteremia, Adult Bacteremia is the presence of bacteria in the blood. When bacteria enter the bloodstream, they can cause a life-threatening reaction called sepsis, which is a medical emergency. Bacteremia can spread to other parts of the body, including the heart, joints, and brain. What are the causes? This condition is caused by bacteria that get into the blood.  Bacteria can enter the blood: ? From a skin infection or injury, such as a burn or a cut. ? From a lung infection (pneumonia). ? From an infection in your stomach or intestines (gastrointestinal infection). ? From an infection in your bladder or urinary system (urinary tract infection). ? During a dental or medical procedure. ? From bleeding gums. ? When a bacterial infection in another part of your body spreads to your blood. ? Through an unclean (contaminated) needle. What increases the risk? This condition is more likely to develop in children, the elderly, and people who:  Have a long-term (chronic) disease or condition like diabetes or chronic kidney failure.  Have an artificial joint or heart valve.  Have heart valve disease.  Have a tube inserted to treat a medical condition, such as a urinary catheter or IV.  Have a weak disease-fighting system (immune system).  Inject illegal drugs.  Have been hospitalized for more than 10 days in a row. What are the signs or symptoms? Symptoms of this condition  include:  Fever.  Chills.  Fast heartbeat.  Shortness of breath.  Dizziness.  Weakness.  Confusion.  Nausea or vomiting.  Diarrhea.  Low blood pressure.  Decreased urine output. Bacteremia that has spread to other parts of the body may cause symptoms in those areas. In some cases, there are no symptoms. How is this diagnosed? This condition may be diagnosed with a physical exam and tests, such as:  A complete blood count (CBC). This test checks for signs of infection.  Blood cultures. These check for bacteria in your blood.  Tests of any tubes that you have had inserted. These tests check for a source of infection.  Urine tests, including urine cultures. These check for bacteria in the urine that could be a source of infection.  Imaging tests, such as an X-ray, CT scan, MRI, or heart ultrasound. These check for a source of infection in other parts of your body, such as your lungs, heart valves, or joints. How is this treated? This condition is usually treated in the hospital. Treatment may involve:  Antibiotic medicines. These may be given by mouth (orally) or directly into your blood through an IV (infusion through your vein). ? Depending on the source of infection, you may need antibiotics for several weeks. ? At first, you may be given an antibiotic to kill most types of blood bacteria (broad-spectrum antibiotic). If your test results show  that a certain kind of bacteria is causing the problem, you may be given a different antibiotic to kill that specific bacteria.  IV fluids.  Removing any catheter or device that could be a source of infection.  Blood pressure and breathing support, if needed.  Surgery to control the source or the spread of infection, such as surgery to remove an infected device, abscess, or tissue.  Having follow-up visits for medicines, blood tests, and further evaluation. Follow these instructions at home: Medicines  Take over-the-counter  and prescription medicines only as told by your health care provider.  If you were prescribed an antibiotic medicine, take it as told by your health care provider. Do not stop taking the antibiotic even if you start to feel better. General instructions   Rest as needed. Ask your health care provider when you may return to normal activities.  Drink enough fluid to keep your urine pale yellow.  Do not use any products that contain nicotine or tobacco, such as cigarettes and e-cigarettes. If you need help quitting, ask your health care provider.  Keep all follow-up visits as told by your health care provider. This is important. How is this prevented?   Wash your hands regularly with soap and water. If soap and water are not available, use hand sanitizer.  You should wash your hands: ? After using the toilet or changing a diaper. ? Before preparing, cooking, or serving food. ? While caring for a sick person or while visiting someone in a hospital. ? Before and after changing bandages (dressings) over wounds.  Clean any scrapes or cuts with soap and water and cover them with clean dressings.  Get vaccinations as recommended by your health care provider.  Practice good oral hygiene. Brush your teeth two times a day, and floss regularly.  Take good care of your skin. This includes bathing and moisturizing on a regular basis. Get help right away if you have:  Pain.  A fever or chills.  Trouble breathing.  A fast heart rate.  Skin that is blotchy, pale, or clammy.  Confusion.  Weakness.  Lack of energy (lethargy) or unusual sleepiness.  Diarrhea.  New symptoms that develop after treatment has started. These symptoms may represent a serious problem that is an emergency. Do not wait to see if the symptoms will go away. Get medical help right away. Call your local emergency services (911 in the [place] Do not drive yourself to the hospital. Summary  Bacteremia is the  presence of bacteria in the blood. When bacteria enter the bloodstream, they can cause a life-threatening reaction called sepsis.  Some symptoms of bacteremia include fever, chills, shortness of breath, confusion, nausea or vomiting, and diarrhea.  Tests may be done to find the source of infection that led to bacteremia. These tests may include blood tests, urine tests, and imaging tests.  Bacteremia is usually treated with antibiotic medicines in the hospital.  Get help right away if you have any new symptoms that develop after treatment has started. This information is not intended to replace advice given to you by your health care provider. Make sure you discuss any questions you have with your health care provider. Document Released: 01/09/2006 Document Revised: 08/07/2017 Document Reviewed: 08/07/2017 Elsevier Patient Education  2020 [employer]

## 2018-11-26 DIAGNOSIS — J449 Chronic obstructive pulmonary disease, unspecified: Secondary | ICD-10-CM | POA: Diagnosis not present

## 2018-11-28 LAB — CULTURE, BLOOD (ROUTINE X 2)
Culture: NO GROWTH
Culture: NO GROWTH
Special Requests: ADEQUATE

## 2018-12-04 ENCOUNTER — Ambulatory Visit: Payer: Medicaid Other

## 2018-12-10 ENCOUNTER — Encounter: Payer: Self-pay | Admitting: Internal Medicine

## 2018-12-10 ENCOUNTER — Ambulatory Visit (INDEPENDENT_AMBULATORY_CARE_PROVIDER_SITE_OTHER): Payer: Medicaid Other | Admitting: Internal Medicine

## 2018-12-10 ENCOUNTER — Other Ambulatory Visit: Payer: Self-pay

## 2018-12-10 VITALS — BP 123/85 | HR 77 | Temp 98.2°F | Ht 60.0 in | Wt 122.1 lb

## 2018-12-10 DIAGNOSIS — E786 Lipoprotein deficiency: Secondary | ICD-10-CM | POA: Diagnosis not present

## 2018-12-10 DIAGNOSIS — E876 Hypokalemia: Secondary | ICD-10-CM | POA: Insufficient documentation

## 2018-12-10 DIAGNOSIS — M25511 Pain in right shoulder: Secondary | ICD-10-CM | POA: Diagnosis not present

## 2018-12-10 DIAGNOSIS — I10 Essential (primary) hypertension: Secondary | ICD-10-CM

## 2018-12-10 DIAGNOSIS — Z7689 Persons encountering health services in other specified circumstances: Secondary | ICD-10-CM | POA: Diagnosis not present

## 2018-12-10 DIAGNOSIS — A419 Sepsis, unspecified organism: Secondary | ICD-10-CM

## 2018-12-10 DIAGNOSIS — M79601 Pain in right arm: Secondary | ICD-10-CM | POA: Insufficient documentation

## 2018-12-10 DIAGNOSIS — Z8619 Personal history of other infectious and parasitic diseases: Secondary | ICD-10-CM

## 2018-12-10 DIAGNOSIS — Z79899 Other long term (current) drug therapy: Secondary | ICD-10-CM | POA: Diagnosis not present

## 2018-12-10 MED ORDER — STIOLTO RESPIMAT 2.5-2.5 MCG/ACT IN AERS: 2.0000 | INHALATION_SPRAY | Freq: Every day | RESPIRATORY_TRACT | 2 refills | Status: DC

## 2018-12-10 MED ORDER — BETAMETHASONE VALERATE 0.1 % EX OINT: 1.0000 "application " | TOPICAL_OINTMENT | Freq: Two times a day (BID) | CUTANEOUS | 0 refills | Status: DC

## 2018-12-10 MED ORDER — SERTRALINE HCL 50 MG PO TABS: 150.0000 mg | ORAL_TABLET | Freq: Every day | ORAL | 2 refills | Status: DC

## 2018-12-10 MED ORDER — AMLODIPINE BESYLATE 5 MG PO TABS: 5.0000 mg | ORAL_TABLET | Freq: Every day | ORAL | 1 refills | Status: DC

## 2018-12-10 NOTE — Progress Notes (Signed)
Internal Medicine Clinic Attending  Case discussed with Dr. Agyei at the time of the visit.  We reviewed the resident's history and exam and pertinent patient test results.  I agree with the assessment, diagnosis, and plan of care documented in the resident's note.    

## 2018-12-10 NOTE — Patient Instructions (Addendum)
Jody Mcclure,   It was a pleasure taking care of you in the clinic today.  I am glad to hear that you have not had any more fevers or chills and you have been doing well after your hospitalization.  For your right shoulder pain, it does not seem to be a tear in your rotator cuff.  I think it could be a possible sprain of 1 of the shoulder muscles and he can try topical Voltaren gel, heat application or ice.  I am getting blood work on you to check on your kidneys and your potassium.  Take Care! Dr. Eileen Stanford  Please call the internal medicine center clinic if you have any questions or concerns, we may be able to help and keep you from a long and expensive emergency room wait. Our clinic and after hours phone number is [phone], the best time to call is Monday through Friday 9 am to 4 pm but there is always someone available 24/7 if you have an emergency. If you need medication refills please notify your pharmacy one week in advance and they will send [place] a request.

## 2018-12-10 NOTE — Assessment & Plan Note (Signed)
Right shoulder pain: Jody Mcclure reports that during her hospitalization, she began experiencing right shoulder pain which is worse when she lies on her right side.  She states her pain improved after she was given pain medicine at the hospital.  Since discharge, the pain has been prevalent but does not interfere with her ADLs.  It is typically worse when she lies on her right side.  She does not report of any cardiac symptoms.  On physical exams, range of motion is intact bilaterally at the shoulders.  She has no tenderness anteriorly at the right shoulder.  He is able to AB duct her right shoulder above 90 degrees but reports of pain at about 180 degrees.  Internal and external rotation is intact bilaterally which makes impingement very less likely.  Assessment: Most likely musculoskeletal sprain of deltoid muscle.  Plan: - Advised to try Voltaren gel, lidocaine patch or Tylenol.

## 2018-12-10 NOTE — Assessment & Plan Note (Signed)
E. coli bacteremia: Jody Mcclure was recently admitted from November 21, 2018 to November 25, 2018 after she was found to have sepsis secondary to E. coli bacteremia from bilateral pyelonephritis.  She was treated with IV antibiotics and discharged on Levaquin 500 mg daily which she has completed a course.  She has been doing very well and denies any subsequent fevers, chills or flank pain.  On physical exams, she does not have CVA tenderness or suprapubic tenderness.  Plan: - Continue to monitor -Follow-up as needed

## 2018-12-10 NOTE — Progress Notes (Signed)
   CC: Follow-up hypertension, hospital follow-up.  HPI:  [name] is a 63 y.o. very pleasant woman with medical history listed below presenting after recent hospitalization and also to follow-up on hypertension.  Please see problem based charting for further details.  Past Medical History:  Diagnosis Date  . Anxiety   . Depression   . Hyperlipidemia   . Hypertension    Review of Systems: As per HPI  Physical Exam:  Vitals:   12/10/18 0834  BP: 123/85  Pulse: 77  Temp: 98.2 F (36.8 C)  TempSrc: Oral  SpO2: 98%  Weight: 122 lb 1.6 oz (55.4 kg)  Height: 5' (1.524 m)   Physical Exam  Constitutional: She is well-developed, well-nourished, and in no distress.  HENT:  Head: Normocephalic and atraumatic.  Eyes: Conjunctivae are normal.  Neck: Neck supple.  Cardiovascular: Normal rate and regular rhythm.  No murmur heard. Pulmonary/Chest: Effort normal and breath sounds normal. She has no wheezes.  Abdominal: Soft. Bowel sounds are normal. There is no abdominal tenderness.  Musculoskeletal: Normal range of motion.        General: Tenderness (When right shoulder is AB ducted at about 180 degrees.) present. No deformity or edema.       Arms:    Assessment & Plan:   See Encounters Tab for problem based charting.  Patient discussed with Dr. Daryll Drown

## 2018-12-10 NOTE — Assessment & Plan Note (Signed)
Was found to have a K+ of 3.4 during hospitalization.  Plan: -Follow BMP

## 2018-12-11 DIAGNOSIS — J449 Chronic obstructive pulmonary disease, unspecified: Secondary | ICD-10-CM | POA: Diagnosis not present

## 2018-12-11 LAB — BMP8+ANION GAP
Anion Gap: 17 mmol/L (ref 10.0–18.0)
BUN/Creatinine Ratio: 16 (ref 12–28)
BUN: 14 mg/dL (ref 8–27)
CO2: 22 mmol/L (ref 20–29)
Calcium: 9.7 mg/dL (ref 8.7–10.3)
Chloride: 102 mmol/L (ref 96–106)
Creatinine, Ser: 0.85 mg/dL (ref 0.57–1.00)
GFR calc Af Amer: 85 mL/min/{1.73_m2} (ref >59)
GFR calc non Af Amer: 74 mL/min/{1.73_m2} (ref >59)
Glucose: 86 mg/dL (ref 65–99)
Potassium: 4.1 mmol/L (ref 3.5–5.2)
Sodium: 141 mmol/L (ref 134–144)

## 2019-01-10 DIAGNOSIS — J449 Chronic obstructive pulmonary disease, unspecified: Secondary | ICD-10-CM | POA: Diagnosis not present

## 2019-02-10 DIAGNOSIS — J449 Chronic obstructive pulmonary disease, unspecified: Secondary | ICD-10-CM | POA: Diagnosis not present

## 2019-03-12 DIAGNOSIS — J449 Chronic obstructive pulmonary disease, unspecified: Secondary | ICD-10-CM | POA: Diagnosis not present

## 2019-04-03 ENCOUNTER — Other Ambulatory Visit: Payer: Self-pay | Admitting: Internal Medicine

## 2019-04-08 MED ORDER — SERTRALINE HCL 50 MG PO TABS: 150.0000 mg | ORAL_TABLET | Freq: Every day | ORAL | 6 refills | Status: DC

## 2019-04-11 ENCOUNTER — Other Ambulatory Visit: Payer: Self-pay | Admitting: Internal Medicine

## 2019-04-12 DIAGNOSIS — J449 Chronic obstructive pulmonary disease, unspecified: Secondary | ICD-10-CM | POA: Diagnosis not present

## 2019-05-13 DIAGNOSIS — J449 Chronic obstructive pulmonary disease, unspecified: Secondary | ICD-10-CM | POA: Diagnosis not present

## 2019-06-10 DIAGNOSIS — J449 Chronic obstructive pulmonary disease, unspecified: Secondary | ICD-10-CM | POA: Diagnosis not present

## 2019-06-17 ENCOUNTER — Other Ambulatory Visit: Payer: Self-pay | Admitting: *Deleted

## 2019-06-17 MED ORDER — AMLODIPINE BESYLATE 5 MG PO TABS: 5.0000 mg | ORAL_TABLET | Freq: Every day | ORAL | 1 refills | Status: DC

## 2019-07-09 ENCOUNTER — Encounter: Payer: Medicaid Other | Admitting: Internal Medicine

## 2019-07-11 ENCOUNTER — Encounter: Payer: Medicaid Other | Admitting: Internal Medicine

## 2019-07-11 DIAGNOSIS — J449 Chronic obstructive pulmonary disease, unspecified: Secondary | ICD-10-CM | POA: Diagnosis not present

## 2019-07-18 ENCOUNTER — Encounter: Payer: Medicaid Other | Admitting: Internal Medicine

## 2019-07-25 ENCOUNTER — Encounter: Payer: Medicaid Other | Admitting: Internal Medicine

## 2019-07-31 ENCOUNTER — Encounter: Payer: Self-pay | Admitting: *Deleted

## 2019-08-01 ENCOUNTER — Other Ambulatory Visit: Payer: Self-pay

## 2019-08-01 ENCOUNTER — Ambulatory Visit (INDEPENDENT_AMBULATORY_CARE_PROVIDER_SITE_OTHER): Payer: Medicaid Other | Admitting: Internal Medicine

## 2019-08-01 ENCOUNTER — Encounter: Payer: Self-pay | Admitting: Internal Medicine

## 2019-08-01 VITALS — BP 125/84 | HR 99 | Temp 98.6°F | Wt 130.5 lb

## 2019-08-01 DIAGNOSIS — M79645 Pain in left finger(s): Secondary | ICD-10-CM

## 2019-08-01 DIAGNOSIS — M79644 Pain in right finger(s): Secondary | ICD-10-CM | POA: Insufficient documentation

## 2019-08-01 DIAGNOSIS — J449 Chronic obstructive pulmonary disease, unspecified: Secondary | ICD-10-CM

## 2019-08-01 DIAGNOSIS — Z79899 Other long term (current) drug therapy: Secondary | ICD-10-CM

## 2019-08-01 DIAGNOSIS — I10 Essential (primary) hypertension: Secondary | ICD-10-CM

## 2019-08-01 DIAGNOSIS — R21 Rash and other nonspecific skin eruption: Secondary | ICD-10-CM | POA: Diagnosis not present

## 2019-08-01 DIAGNOSIS — N393 Stress incontinence (female) (male): Secondary | ICD-10-CM | POA: Diagnosis not present

## 2019-08-01 DIAGNOSIS — F411 Generalized anxiety disorder: Secondary | ICD-10-CM

## 2019-08-01 LAB — POCT URINALYSIS DIPSTICK
Glucose, UA: NEGATIVE
Ketones, UA: NEGATIVE
Nitrite, UA: NEGATIVE
Protein, UA: NEGATIVE
Spec Grav, UA: >=1.03 — AB (ref 1.010–1.025)
Urobilinogen, UA: 0.2 E.U./dL
pH, UA: 5.5 (ref 5.0–8.0)

## 2019-08-01 MED ORDER — DICLOFENAC SODIUM 1 % EX GEL: 2.0000 g | Freq: Four times a day (QID) | CUTANEOUS | 1 refills | Status: DC

## 2019-08-01 MED ORDER — HYDROXYZINE HCL 25 MG PO TABS: 25.0000 mg | ORAL_TABLET | Freq: Three times a day (TID) | ORAL | 1 refills | Status: DC | PRN

## 2019-08-01 NOTE — Progress Notes (Signed)
   CC: bilateral hand pain and routine follow-up  HPI:  [name] is a 64 y.o. F with signficant PMH as outlined below, who presents for bilateral hand pain, rash, and routine follow-up. Please see problem-based charting for additional information.   Past Medical History:  Diagnosis Date  . Anxiety   . Depression   . Hyperlipidemia   . Hypertension    Review of Systems:   Review of Systems  Constitutional: Negative for chills, fever and malaise/fatigue.  Eyes: Negative for blurred vision, double vision and photophobia.  Respiratory: Negative for cough and shortness of breath.   Cardiovascular: Negative for chest pain and palpitations.  Gastrointestinal: Negative for abdominal pain, constipation, diarrhea, nausea and vomiting.  Genitourinary: Positive for frequency and urgency. Negative for dysuria, flank pain and hematuria.       Mix of urge and stress incontinence  Musculoskeletal: Positive for joint pain (bilateral thumb pain). Negative for myalgias.  Skin: Positive for itching and rash (evidence of picked spots on bilateral forarms).  Neurological: Negative for dizziness, tingling, tremors, sensory change, focal weakness, weakness and headaches.  Psychiatric/Behavioral: The patient is nervous/anxious.    Physical Exam:  Vitals:   08/01/19 1359  BP: 125/84  Pulse: 99  Temp: 98.6 F (37 C)  TempSrc: Oral  SpO2: 99%  Weight: 130 lb 8 oz (59.2 kg)   Physical Exam Vitals and nursing note reviewed.  Constitutional:      General: She is not in acute distress.    Appearance: She is not ill-appearing.  Cardiovascular:     Rate and Rhythm: Normal rate and regular rhythm.     Heart sounds: Normal heart sounds.  Pulmonary:     Effort: Pulmonary effort is normal. No respiratory distress.     Breath sounds: Normal breath sounds. No wheezing, rhonchi or rales.  Musculoskeletal:       Hands:     Comments: Squaring off of thumbs bilaterally. No thenar muscle atrophy. No  tenderness of palpation of carpometacarpal or metacarpophalangeal joint in bilateral thumbs.  Skin:    Comments: Scattered areas of excoriations and isolated sub-centimeter ulcerations over bilateral arms. Pt endorses itchy and picking at them. No active bleeding or drainage from any site.  Neurological:     General: No focal deficit present.     Mental Status: She is alert.     Comments: Symmetric sensation to light touch in bilateral upper extremities. Full strength in wrists and hands bilaterally.  Psychiatric:        Mood and Affect: Mood is anxious.    Assessment & Plan:   See Encounters Tab for problem based charting.  Patient discussed with Dr. Mikey Bussing

## 2019-08-01 NOTE — Patient Instructions (Addendum)
Jody Mcclure,  It was nice meeting you! Today we addressed multiple things.  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 Please begin Voltaren gel to the area 3-4 times daily as needed.   Urinary incontinence - There were no signs of infection on your urine specimen. I will send the sample to the lab for culture and call you if the result is abnormal.  Itching and anxiety - Please begin hydroxyzine 25mg  3 times a day as needed for itching or anxiety. Continue taking sertraline 150mg  daily. I have placed a referral to , our behavioral health conselor here, to help you develop healthy coping strategies for your anxiety.  COPD - Continue your daily inhaler and nebulizer as needed.  High blood pressure - Your blood pressure looks great today! Continue taking your daily amlodipine.  Please follow-up in clinic in 3 months.  Thank you for letting me be a part of your care!

## 2019-08-04 LAB — URINE CULTURE

## 2019-08-05 MED ORDER — NITROFURANTOIN MONOHYD MACRO 100 MG PO CAPS: 100.0000 mg | ORAL_CAPSULE | Freq: Two times a day (BID) | ORAL | 0 refills | Status: AC

## 2019-08-10 DIAGNOSIS — J449 Chronic obstructive pulmonary disease, unspecified: Secondary | ICD-10-CM | POA: Diagnosis not present

## 2019-08-17 NOTE — Assessment & Plan Note (Signed)
Pt endorsing months long history of intermittent bilateral hand pain. Identifies the pain is worst at the carpometacarpal joint in bilateral thumbs. States pain is exacerbated by overuse and resolves with rest. Endorses occasional weakness with overuse. Denies sensory change, numbness, or tingling. Her pain worsens over the course of the day. Denies fevers, chills, overlying skin changes, or wrist trauma. 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   - will trial topical voltaren gel for pain relief

## 2019-08-17 NOTE — Assessment & Plan Note (Signed)
Pt with known history of stress incontinence for which she wears adult diapers daily. Today she endorses some feelings of urgency and hesitation. Denies dysuria, fevers, chills, hematuria, flank pain, nausea/vomiting, abdominal pain, or constipation. Abdominal exam benign, soft, non-tender, and with positive bowel sounds. UA today with trace leukocytes, small blood, and negative nitrites.  - send off urine culture  ADDENDUM Culture grew >100,000 CFU of citrobacter koseri that was pan-sensitive. Called pt to discuss results over the phone on 4/26. Will prescribe 5 day course of nitrofurantoin.

## 2019-08-17 NOTE — Assessment & Plan Note (Signed)
Pt endorses taking 5mg  amlodipine daily. Denies medication side effects, dizziness/lightheadedness, headaches, chest pain, palpitations, leg swelling, or vision changes. BP today well controlled at 125/84.  - continue current regimen   BP Readings from Last 3 Encounters:  08/01/19 125/84  12/10/18 123/85  11/25/18 (!) 143/85

## 2019-08-17 NOTE — Assessment & Plan Note (Signed)
Pt continues to have nonspecific rash worst today over bilateral forearms and hands. Previously referred to derm last year for this but appears that the pt was never seen. Endorses they are itchy and pt actively rubbing/itching them during the examination. Exam remarkable for scattered areas of excoriations and isolated sub-centimeter ulcerations over bilateral arms. No erythema, warmth, active bleeding, or drainage from any site.  - advised Vaseline to protect skin and moisture barrier - start PRN hydroxyzine for anxiety and itching

## 2019-08-17 NOTE — Assessment & Plan Note (Signed)
Pt taking sertraline 150mg  daily for generalized anxiety disorder. States she feels this medication is working for her, however she has been picking and itchy her bilateral forearms over the last few months. Denies fevers, chills, asymmetric erythema or warmth of her arms. Say itching them "is a nervous habit." Has intermittent insomnia. Denies thoughts of self harm, SI, or HI. PHQ-9 today 15. Pt is interested in referral to counseling services to learning healthy coping strategies for her anxiety.  - continue sertraline 150mg  daily - add PRN hydroxyzine 25mg  for anxiety/itching - referral to behavioral health

## 2019-08-19 NOTE — Progress Notes (Signed)
Internal Medicine Clinic Attending  Case discussed with Dr. Jones at the time of the visit.  We reviewed the resident's history and exam and pertinent patient test results.  I agree with the assessment, diagnosis, and plan of care documented in the resident's note.  

## 2019-08-20 ENCOUNTER — Telehealth: Payer: Self-pay | Admitting: Licensed Clinical Social Worker

## 2019-08-20 NOTE — Telephone Encounter (Signed)
Patient was called to discuss the referral for services. Patient agreed, and she will be added to my calendar for 5/26 @ 12:00 via phone.

## 2019-09-04 ENCOUNTER — Telehealth: Payer: Self-pay | Admitting: Licensed Clinical Social Worker

## 2019-09-04 ENCOUNTER — Ambulatory Visit: Payer: Medicaid Other | Admitting: Licensed Clinical Social Worker

## 2019-09-04 NOTE — Telephone Encounter (Signed)
Patient was called for her scheduled visit. Patient did not answer, and a vm was left for her to reschedule.

## 2019-09-04 NOTE — Addendum Note (Signed)
Addended by: Neomia Dear on: 09/04/2019 07:02 PM   Modules accepted: Orders

## 2019-09-05 ENCOUNTER — Telehealth: Payer: Self-pay | Admitting: *Deleted

## 2019-09-05 NOTE — Telephone Encounter (Signed)
Received faxed request for order, CMN, and OV notes addressing need for urinary incontinence supplies from Arlana Pouch at [facility] [employer]. This was addressed at OV with PCP on 08/01/2019. Forms placed in PCP's box for signatures. Kinnie Feil, BSN, RN-BC

## 2019-09-05 NOTE — Telephone Encounter (Signed)
Signed order, CMN, and 08/01/2019 OV notes addressing need for urinary incontinence supplies faxed to Arlana Pouch at [employer] at [phone]. Kinnie Feil, BSN, RN-BC

## 2019-09-05 NOTE — Telephone Encounter (Signed)
Signed and completed.

## 2019-09-06 DIAGNOSIS — R32 Unspecified urinary incontinence: Secondary | ICD-10-CM | POA: Diagnosis not present

## 2019-09-06 DIAGNOSIS — J449 Chronic obstructive pulmonary disease, unspecified: Secondary | ICD-10-CM | POA: Diagnosis not present

## 2019-10-07 DIAGNOSIS — J449 Chronic obstructive pulmonary disease, unspecified: Secondary | ICD-10-CM | POA: Diagnosis not present

## 2019-10-07 DIAGNOSIS — R32 Unspecified urinary incontinence: Secondary | ICD-10-CM | POA: Diagnosis not present

## 2019-11-10 ENCOUNTER — Other Ambulatory Visit: Payer: Self-pay | Admitting: Internal Medicine

## 2019-11-22 DIAGNOSIS — R32 Unspecified urinary incontinence: Secondary | ICD-10-CM | POA: Diagnosis not present

## 2019-11-22 DIAGNOSIS — J449 Chronic obstructive pulmonary disease, unspecified: Secondary | ICD-10-CM | POA: Diagnosis not present

## 2019-12-02 ENCOUNTER — Other Ambulatory Visit: Payer: Self-pay

## 2019-12-02 MED ORDER — HYDROXYZINE HCL 25 MG PO TABS: 25.0000 mg | ORAL_TABLET | Freq: Three times a day (TID) | ORAL | 1 refills | Status: DC | PRN

## 2019-12-02 MED ORDER — DICLOFENAC SODIUM 1 % EX GEL: 2.0000 g | Freq: Four times a day (QID) | CUTANEOUS | 1 refills | Status: DC

## 2019-12-11 DIAGNOSIS — Z419 Encounter for procedure for purposes other than remedying health state, unspecified: Secondary | ICD-10-CM | POA: Diagnosis not present

## 2019-12-19 DIAGNOSIS — R32 Unspecified urinary incontinence: Secondary | ICD-10-CM | POA: Diagnosis not present

## 2019-12-30 ENCOUNTER — Encounter: Payer: Self-pay | Admitting: Student

## 2019-12-30 ENCOUNTER — Ambulatory Visit (INDEPENDENT_AMBULATORY_CARE_PROVIDER_SITE_OTHER): Payer: Medicaid Other | Admitting: Student

## 2019-12-30 VITALS — BP 135/79 | HR 81 | Temp 98.0°F | Ht 60.0 in | Wt 140.0 lb

## 2019-12-30 DIAGNOSIS — E785 Hyperlipidemia, unspecified: Secondary | ICD-10-CM

## 2019-12-30 DIAGNOSIS — I10 Essential (primary) hypertension: Secondary | ICD-10-CM

## 2019-12-30 DIAGNOSIS — Z1231 Encounter for screening mammogram for malignant neoplasm of breast: Secondary | ICD-10-CM

## 2019-12-30 DIAGNOSIS — R21 Rash and other nonspecific skin eruption: Secondary | ICD-10-CM

## 2019-12-30 DIAGNOSIS — R0600 Dyspnea, unspecified: Secondary | ICD-10-CM

## 2019-12-30 DIAGNOSIS — Z23 Encounter for immunization: Secondary | ICD-10-CM | POA: Diagnosis not present

## 2019-12-30 DIAGNOSIS — R0609 Other forms of dyspnea: Secondary | ICD-10-CM

## 2019-12-30 DIAGNOSIS — M159 Polyosteoarthritis, unspecified: Secondary | ICD-10-CM

## 2019-12-30 DIAGNOSIS — Z8639 Personal history of other endocrine, nutritional and metabolic disease: Secondary | ICD-10-CM | POA: Diagnosis not present

## 2019-12-30 DIAGNOSIS — Z862 Personal history of diseases of the blood and blood-forming organs and certain disorders involving the immune mechanism: Secondary | ICD-10-CM

## 2019-12-30 DIAGNOSIS — E7800 Pure hypercholesterolemia, unspecified: Secondary | ICD-10-CM | POA: Diagnosis not present

## 2019-12-30 DIAGNOSIS — J449 Chronic obstructive pulmonary disease, unspecified: Secondary | ICD-10-CM | POA: Diagnosis not present

## 2019-12-30 DIAGNOSIS — F329 Major depressive disorder, single episode, unspecified: Secondary | ICD-10-CM | POA: Diagnosis not present

## 2019-12-30 DIAGNOSIS — M79644 Pain in right finger(s): Secondary | ICD-10-CM

## 2019-12-30 DIAGNOSIS — F411 Generalized anxiety disorder: Secondary | ICD-10-CM | POA: Diagnosis not present

## 2019-12-30 DIAGNOSIS — M79645 Pain in left finger(s): Secondary | ICD-10-CM

## 2019-12-30 DIAGNOSIS — Z463 Encounter for fitting and adjustment of dental prosthetic device: Secondary | ICD-10-CM

## 2019-12-30 DIAGNOSIS — E782 Mixed hyperlipidemia: Secondary | ICD-10-CM

## 2019-12-30 LAB — POCT GLYCOSYLATED HEMOGLOBIN (HGB A1C): Hemoglobin A1C: 5.3 % (ref 4.0–5.6)

## 2019-12-30 LAB — GLUCOSE, CAPILLARY: Glucose-Capillary: 106 mg/dL — ABNORMAL HIGH (ref 70–99)

## 2019-12-30 MED ORDER — SERTRALINE HCL 50 MG PO TABS: 150.0000 mg | ORAL_TABLET | Freq: Every day | ORAL | 0 refills | Status: DC

## 2019-12-30 MED ORDER — DICLOFENAC SODIUM 1 % EX GEL: 2.0000 g | Freq: Four times a day (QID) | CUTANEOUS | 2 refills | Status: DC

## 2019-12-30 MED ORDER — ALBUTEROL SULFATE (2.5 MG/3ML) 0.083% IN NEBU: 2.5000 mg | INHALATION_SOLUTION | Freq: Two times a day (BID) | RESPIRATORY_TRACT | 0 refills | Status: DC | PRN

## 2019-12-30 MED ORDER — STIOLTO RESPIMAT 2.5-2.5 MCG/ACT IN AERS: INHALATION_SPRAY | RESPIRATORY_TRACT | 5 refills | Status: AC

## 2019-12-30 MED ORDER — HYDROXYZINE HCL 25 MG PO TABS: 25.0000 mg | ORAL_TABLET | Freq: Three times a day (TID) | ORAL | 0 refills | Status: DC | PRN

## 2019-12-30 MED ORDER — AMLODIPINE BESYLATE 5 MG PO TABS: 5.0000 mg | ORAL_TABLET | Freq: Every day | ORAL | 0 refills | Status: DC

## 2019-12-30 MED ORDER — VARICELLA VIRUS VACCINE LIVE 1350 PFU/0.5ML IJ SUSR: 0.5000 mL | Freq: Once | INTRAMUSCULAR | 0 refills | Status: AC

## 2019-12-30 NOTE — Assessment & Plan Note (Signed)
Patient reports she continues to have itchy rash on legs. States she often scratches them and they end up bleeding. Mentions she takes three hydroxyzine tablets at night with her zoloft.  A/P: -Discussed with patient hydroxyzine might help more if taken throughout the day. Discussed possible side effect of increased drowsiness and confusion if taking all three at once. -Continue hydroxyzine three times per day as needed.

## 2019-12-30 NOTE — Assessment & Plan Note (Addendum)
Patient states she is taking 150mg  per day consistently. Reports anxiety symptoms have been well controlled with the medication. PHQ-9 16 today.   -Continue sertraline 150mg  per day

## 2019-12-30 NOTE — Progress Notes (Signed)
   CC: itchiness  HPI:  [name] is a 64 y.o. with medical history as listed below who presents for yearly health maintenance visit.  Please see problem-based list for further details.  Past Medical History:  Diagnosis Date  . Anxiety   . Depression   . Hyperlipidemia   . Hypertension    Review of Systems:  As per HPI  Physical Exam:  Vitals:   12/30/19 1347  BP: 135/79  Pulse: 81  Temp: 98 F (36.7 C)  TempSrc: Oral  SpO2: 96%  Weight: 140 lb (63.5 kg)  Height: 5' (1.524 m)   General: Anxious, no acute distress Pulm: Clear to auscultation bilaterally. No wheezing, rales. CV: Regular rate, rhythm. Normal S1, S2.  No murmurs, rubs, gallops. MSK: Multiple, small scabs on bilateral legs. No pitting edema bilaterally.  Assessment & Plan:   See Encounters Tab for problem based charting.  Patient seen with Dr. Heide Spark

## 2019-12-30 NOTE — Assessment & Plan Note (Signed)
Patient reports compliance with amlodipine 5mg  per day. States she regularly checks her blood pressure with systolic usually .   BP Readings from Last 3 Encounters:  12/30/19 135/79  08/01/19 125/84  12/10/18 123/85   -Will continue amlodipine 5mg  per day.

## 2019-12-30 NOTE — Assessment & Plan Note (Signed)
Patient mentions her inhaler does not improve her shortness of breath well. Mentions she uses Stiolto as needed. Reports mild cough intermittently, but no acute changes. Denies increased shortness of breath since last visit no wheezing.  A/P: -Discussed with patient that Stiolto is maintenance inhaler that should be used twice per day. Explained this inhaler works over longer period of time and not acutely. Encouraged patient to use albuterol nebulizer for acute shortness of breath. -Continue Stiolto two puffs per day, albuterol nebulizer twice per day as needed

## 2019-12-30 NOTE — Patient Instructions (Addendum)
Ms. Silverstein,  It was a pleasure seeing you today!  Today we discussed your COPD medications. Please see the following notes: -STIOLTO (tiotropium bromide-olodaterol): Take 2 puffs per day every day. This is your maintenance inhaler that you take every day. -PROVENTIL (albuterol): Take as needed. This is your "rescue" inhaler that you use whenever you feel more short of breath.  We also discussed the medications for your anxiety and itching. Please see the following notes: -Hydroxyzine 25mg : Take one pill, three times per day throughout the day. Make sure to not take all pills at one time. -ZOLOFT (sertraline) 150mg : Take three pills per day, every day. This medication is for your anxiety.  Your blood pressure looks great! Keep taking amlodipine and checking your blood pressure regularly.  We will get some blood work today and will call you with the results and any medication changes that will be needed.  We will also send in referrals for dentures and mammogram.   Please let know if you have any trouble getting coverage for the voltaren gel.  We look forward to seeing you next time. Please call our clinic at [phone] if you have any questions or concerns. The best time to call is Monday-Friday from 9am-4pm, but there is someone available 24/7 at the same number. If you need medication refills, please notify your pharmacy one week in advance and they will send [place] a request.  Thank you for letting [phone] take part in your care. Wishing you the best!  Thank you, Dr. [place], MD

## 2019-12-31 DIAGNOSIS — E782 Mixed hyperlipidemia: Secondary | ICD-10-CM | POA: Insufficient documentation

## 2019-12-31 LAB — CBC
Hematocrit: 42.8 % (ref 34.0–46.6)
Hemoglobin: 14.7 g/dL (ref 11.1–15.9)
MCH: 30.5 pg (ref 26.6–33.0)
MCHC: 34.3 g/dL (ref 31.5–35.7)
MCV: 89 fL (ref 79–97)
Platelets: 298 10*3/uL (ref 150–450)
RBC: 4.82 x10E6/uL (ref 3.77–5.28)
RDW: 12.8 % (ref 11.7–15.4)
WBC: 7.2 10*3/uL (ref 3.4–10.8)

## 2019-12-31 LAB — BMP8+ANION GAP
Anion Gap: 15 mmol/L (ref 10.0–18.0)
BUN/Creatinine Ratio: 18 (ref 12–28)
BUN: 12 mg/dL (ref 8–27)
CO2: 22 mmol/L (ref 20–29)
Calcium: 9.7 mg/dL (ref 8.7–10.3)
Chloride: 102 mmol/L (ref 96–106)
Creatinine, Ser: 0.65 mg/dL (ref 0.57–1.00)
GFR calc Af Amer: 109 mL/min/{1.73_m2} (ref >59)
GFR calc non Af Amer: 95 mL/min/{1.73_m2} (ref >59)
Glucose: 98 mg/dL (ref 65–99)
Potassium: 4.3 mmol/L (ref 3.5–5.2)
Sodium: 139 mmol/L (ref 134–144)

## 2019-12-31 LAB — LIPID PANEL
Chol/HDL Ratio: 10.5 ratio — ABNORMAL HIGH (ref 0.0–4.4)
Cholesterol, Total: 324 mg/dL — ABNORMAL HIGH (ref 100–199)
HDL: 31 mg/dL — ABNORMAL LOW (ref >39)
LDL Chol Calc (NIH): 175 mg/dL — ABNORMAL HIGH (ref 0–99)
Triglycerides: 575 mg/dL (ref 0–149)
VLDL Cholesterol Cal: 118 mg/dL — ABNORMAL HIGH (ref 5–40)

## 2019-12-31 MED ORDER — PRAVASTATIN SODIUM 40 MG PO TABS: 40.0000 mg | ORAL_TABLET | Freq: Every evening | ORAL | 11 refills | Status: DC

## 2019-12-31 NOTE — Assessment & Plan Note (Addendum)
ADDENDUM: Last lipid panel in 2016. Lipid panel drawn yesterday with results as below. ASCVD 10-year risk 11.9%. Discussed with patient, will start pravastatin 40mg  per day.  Lipid Panel     Component Value Date/Time   CHOL 324 (H) 12/30/2019 1436   TRIG 575 (HH) 12/30/2019 1436   HDL 31 (L) 12/30/2019 1436   CHOLHDL 10.5 (H) 12/30/2019 1436   CHOLHDL 4.7 08/05/2014 1509   VLDL 32 08/05/2014 1509   LDLCALC 175 (H) 12/30/2019 1436   LABVLDL 118 (H) 12/30/2019 1436

## 2019-12-31 NOTE — Progress Notes (Signed)
Discussed with patient 9/21 results from yesterday's blood work. Explained elevated cholesterol, triglycerides and ASCVD 10-year risk 11.9%. Decision made to start pravastatin 40mg  per day. Also counseled patient on importance of healthier diet, including decreasing carb-rich foods.

## 2019-12-31 NOTE — Progress Notes (Signed)
Internal Medicine Clinic Attending  I saw and evaluated the patient.  I personally confirmed the key portions of the history and exam documented by Dr. Braswell and I reviewed pertinent patient test results.  The assessment, diagnosis, and plan were formulated together and I agree with the documentation in the resident's note.  

## 2019-12-31 NOTE — Addendum Note (Signed)
Addended byEvlyn Kanner on: 12/31/2019 03:08 PM   Modules accepted: Level of Service

## 2019-12-31 NOTE — Addendum Note (Signed)
Addended byEvlyn Kanner on: 12/31/2019 10:42 AM   Modules accepted: Orders

## 2020-01-10 DIAGNOSIS — Z419 Encounter for procedure for purposes other than remedying health state, unspecified: Secondary | ICD-10-CM | POA: Diagnosis not present

## 2020-02-04 ENCOUNTER — Other Ambulatory Visit: Payer: Self-pay | Admitting: Student

## 2020-02-04 DIAGNOSIS — F411 Generalized anxiety disorder: Secondary | ICD-10-CM

## 2020-02-09 ENCOUNTER — Other Ambulatory Visit: Payer: Self-pay | Admitting: Student

## 2020-02-09 DIAGNOSIS — R0609 Other forms of dyspnea: Secondary | ICD-10-CM

## 2020-02-09 DIAGNOSIS — F411 Generalized anxiety disorder: Secondary | ICD-10-CM

## 2020-02-09 DIAGNOSIS — R0600 Dyspnea, unspecified: Secondary | ICD-10-CM

## 2020-02-10 DIAGNOSIS — Z419 Encounter for procedure for purposes other than remedying health state, unspecified: Secondary | ICD-10-CM | POA: Diagnosis not present

## 2020-02-26 DIAGNOSIS — R32 Unspecified urinary incontinence: Secondary | ICD-10-CM | POA: Diagnosis not present

## 2020-03-11 DIAGNOSIS — Z419 Encounter for procedure for purposes other than remedying health state, unspecified: Secondary | ICD-10-CM | POA: Diagnosis not present

## 2020-03-24 DIAGNOSIS — R32 Unspecified urinary incontinence: Secondary | ICD-10-CM | POA: Diagnosis not present

## 2020-04-11 DIAGNOSIS — Z419 Encounter for procedure for purposes other than remedying health state, unspecified: Secondary | ICD-10-CM | POA: Diagnosis not present

## 2020-04-20 ENCOUNTER — Other Ambulatory Visit: Payer: Medicaid Other | Admitting: *Deleted

## 2020-04-20 NOTE — Patient Outreach (Signed)
Care Coordination  04/20/2020  Jody Mcclure 06-29-1955 [id number]    Medicaid Managed Care   Unsuccessful Outreach Note  04/20/2020 Name: Jody Mcclure MRN: [id number] DOB: 1955-05-13  Referred by: Evlyn Kanner, MD Reason for referral : High Risk Managed Medicaid (Unsuccessful initial outreach)   An unsuccessful telephone outreach was attempted today. The patient was referred to the case management team for assistance with care management and care coordination.   Follow Up Plan: The care management team will reach out to the patient again over the next 7-14 days.   [name] RN, BSN [place]  [employer]

## 2020-04-20 NOTE — Patient Instructions (Signed)
Visit Information  [name]  - as a part of your Medicaid benefit, you are eligible for care management and care coordination services at no cost or copay. I was unable to reach you by phone today but would be happy to help you with your health related needs. Please feel free to call me @ [phone].   A member of the Managed Medicaid care management team will reach out to you again over the next 7-14 days.   [name] RN, BSN [place]  [employer]

## 2020-04-29 ENCOUNTER — Other Ambulatory Visit: Payer: Self-pay

## 2020-04-29 ENCOUNTER — Other Ambulatory Visit: Payer: Self-pay | Admitting: *Deleted

## 2020-04-29 NOTE — Patient Instructions (Signed)
Visit Information  Jody Mcclure was given information about Medicaid Managed Care team care coordination services as a part of their [facility] Medicaid benefit. Jody Mcclure verbally consented to engagement with the [facility] Managed Care team.   For questions related to your [facility] health plan, please call: [phone]  If you would like to schedule transportation through your [employer] plan, please call the following number at least 2 days in advance of your appointment: [phone]  Jody Mcclure - following are the goals we discussed in your visit today:  Goals Addressed            This Visit's Progress   . Eat Healthy       Timeframe:  Long-Range Goal Priority:  High Start Date:   04/29/20                          Expected End Date:   06/29/20                  -Schedule an appointment and attend for [facility] evaluation at [facility] [phone])  - change to whole grain breads, cereal, pasta - drink 6 to 8 glasses of water each day - fill half of plate with vegetables - limit fast food meals to no more than 1 per week    Why is this important?    When you are ready to manage your nutrition or weight, having a plan and setting goals will help.   Taking small steps to change how you eat and exercise is a good place to start.        . Make and Keep All Appointments       Timeframe:  Long-Range Goal Priority:  Medium Start Date:  04/29/20                           Expected End Date: 06/29/20                     - Call [facility] transportation provided by [employer] [phone]) - call to cancel if needed - keep a calendar with prescription refill dates - keep a calendar with appointment dates    Why is this important?    Part of staying healthy is seeing the doctor for follow-up care.   If you forget your appointments, there are some things you can do to stay on track.        . Track and Manage My Triggers-COPD       Timeframe:  Long-Range  Goal Priority:  High Start Date:  04/29/20                           Expected End Date: 06/29/20                        - avoid second hand smoke - identify and remove indoor air pollutants - limit outdoor activity during cold weather    Why is this important?    Triggers are activities or things, like tobacco smoke or cold weather, that make your COPD (chronic obstructive pulmonary disease) flare-up.   Knowing these triggers helps you plan how to stay away from them.   When you cannot remove them, you can learn how to manage them.            Please see  education materials related to COPD provided as print materials.     Chronic Obstructive Pulmonary Disease  Chronic obstructive pulmonary disease (COPD) is a long-term (chronic) lung problem. When you have COPD, it is hard for air to get in and out of your lungs. Usually the condition gets worse over time, and your lungs will never return to normal. There are things you can do to keep yourself as healthy as possible. What are the causes?  Smoking. This is the most common cause.  Certain genes passed from parent to child (inherited). What increases the risk?  Being exposed to secondhand smoke from cigarettes, pipes, or cigars.  Being exposed to chemicals and other irritants, such as fumes and dust in the work environment.  Having chronic lung conditions or infections. What are the signs or symptoms?  Shortness of breath, especially during physical activity.  A long-term cough with a large amount of thick mucus. Sometimes, the cough may not have any mucus (dry cough).  Wheezing.  Breathing quickly.  Skin that looks gray or blue, especially in the fingers, toes, or lips.  Feeling tired (fatigue).  Weight loss.  Chest tightness.  Having infections often.  Episodes when breathing symptoms become much worse (exacerbations). At the later stages of this disease, you may have swelling in the ankles, feet, or  legs. How is this treated?  Taking medicines.  Quitting smoking, if you smoke.  Rehabilitation. This includes steps to make your body work better. It may involve a team of specialists.  Doing exercises.  Making changes to your diet.  Using oxygen.  Lung surgery.  Lung transplant.  Comfort measures (palliative care). Follow these instructions at home: Medicines  Take over-the-counter and prescription medicines only as told by your doctor.  Talk to your doctor before taking any cough or allergy medicines. You may need to avoid medicines that cause your lungs to be dry. Lifestyle  If you smoke, stop smoking. Smoking makes the problem worse.  Do not smoke or use any products that contain nicotine or tobacco. If you need help quitting, ask your doctor.  Avoid being around things that make your breathing worse. This may include smoke, chemicals, and fumes.  Stay active, but remember to rest as well.  Learn and use tips on how to manage stress and control your breathing.  Make sure you get enough sleep. Most adults need at least 7 hours of sleep every night.  Eat healthy foods. Eat smaller meals more often. Rest before meals. Controlled breathing Learn and use tips on how to control your breathing as told by your doctor. Try:  Breathing in (inhaling) through your nose for 1 second. Then, pucker your lips and breath out (exhale) through your lips for 2 seconds.  Putting one hand on your belly (abdomen). Breathe in slowly through your nose for 1 second. Your hand on your belly should move out. Pucker your lips and breathe out slowly through your lips. Your hand on your belly should move in as you breathe out.   Controlled coughing Learn and use controlled coughing to clear mucus from your lungs. Follow these steps: 1. Lean your head a little forward. 2. Breathe in deeply. 3. Try to hold your breath for 3 seconds. 4. Keep your mouth slightly open while coughing 2  times. 5. Spit any mucus out into a tissue. 6. Rest and do the steps again 1 or 2 times as needed. General instructions  Make sure you get all the shots (vaccines) that  your doctor recommends. Ask your doctor about a flu shot and a pneumonia shot.  Use oxygen therapy and pulmonary rehabilitation if told by your doctor. If you need home oxygen therapy, ask your doctor if you should buy a tool to measure your oxygen level (oximeter).  Make a COPD action plan with your doctor. This helps you to know what to do if you feel worse than usual.  Manage any other conditions you have as told by your doctor.  Avoid going outside when it is very hot, cold, or humid.  Avoid people who have a sickness you can catch (contagious).  Keep all follow-up visits. Contact a doctor if:  You cough up more mucus than usual.  There is a change in the color or thickness of the mucus.  It is harder to breathe than usual.  Your breathing is faster than usual.  You have trouble sleeping.  You need to use your medicines more often than usual.  You have trouble doing your normal activities such as getting dressed or walking around the house. Get help right away if:  You have shortness of breath while resting.  You have shortness of breath that stops you from: ? Being able to talk. ? Doing normal activities.  Your chest hurts for longer than 5 minutes.  Your skin color is more blue than usual.  Your pulse oximeter shows that you have low oxygen for longer than 5 minutes.  You have a fever.  You feel too tired to breathe normally. These symptoms may represent a serious problem that is an emergency. Do not wait to see if the symptoms will go away. Get medical help right away. Call your local emergency services (911 in the [place] Do not drive yourself to the hospital. Summary  Chronic obstructive pulmonary disease (COPD) is a long-term lung problem.  The way your lungs work will never return to  normal. Usually the condition gets worse over time. There are things you can do to keep yourself as healthy as possible.  Take over-the-counter and prescription medicines only as told by your doctor.  If you smoke, stop. Smoking makes the problem worse. This information is not intended to replace advice given to you by your health care provider. Make sure you discuss any questions you have with your health care provider. Document Revised: 02/04/2020 Document Reviewed: 02/04/2020 Elsevier Patient Education  2021 [employer].   The patient verbalized understanding of instructions provided today and agreed to receive a mailed copy of patient instruction and/or educational materials.  Telephone follow up appointment with Managed Medicaid care management team member scheduled for:06/29/20 @ 9am  Jody Dach, RN  Following is a copy of your plan of care:  Patient Care Plan: COPD (Adult)    Problem Identified: Disease Progression (COPD)     Long-Range Goal: Disease Progression Minimized or Managed   Start Date: 04/29/2020  Expected End Date: 06/29/2020  This Visit's Progress: On track  Priority: High  Note:   Current Barriers:  . Chronic Disease Management support and education needs related to COPD. Ms Villari manages her COPD with a twice daily inhaler and nebulizer treatment as needed, light walks daily and getting adequate rest. She reports using the nebulizer about once per week. She is having difficulty eating a healthy diet due to needing dentures. . Corporate treasurer.  . Transportation barriers  Nurse Case Manager Clinical Goal(s):  Marland Kitchen Over the next 60 days, patient will verbalize understanding of plan for COPD .  Over the next 60 days, patient will work with [phone]) to address needs related to oral care/denture procurement . Over the next 60 days, the patient will demonstrate ongoing self health care management ability as evidenced by taking medications as  prescribed, participating in light exercise as tolerated, avoiding triggers such as smoke and extreme temperature changes  Interventions:  . Inter-disciplinary care team collaboration (see longitudinal plan of care) . Evaluation of current treatment plan related to COPD and patient's adherence to plan as established by provider. . Advised patient to utilize transportation provided by [facility] for medical appointments . Reviewed medications with patient and discussed when to use inhaler and nebulizer, refill needed for inhaler . Collaborated with PCP regarding refill for inhaler . Discussed plans with patient for ongoing care management follow up and provided patient with direct contact information for care management team . Advised patient, providing education and rationale, to weigh daily and record, calling PCP for weight gain of 3lbs overnight or 5 pounds in a week.  Marland Kitchen Pharmacy referral for medication review  Patient Goals/Self-Care Activities Over the next 60 days, patient will: - change to whole grain breads, cereal, pasta - drink 6 to 8 glasses of water each day - fill half of plate with vegetables - limit fast food meals to no more than 1 per week - Schedule an appointment and attend for [facility]ure evaluation at [facility] [phone])  - Call [facility] transportation provided by [employer] [phone]) - call to cancel if needed - keep a calendar with prescription refill dates - keep a calendar with appointment dates  - avoid second hand smoke - identify and remove indoor air pollutants - limit outdoor activity during cold weather  Follow Up Plan: Telephone follow up appointment with Managed Medicaid care management team member scheduled for:06/29/20 @ 9am       Problem Identified: Symptom Exacerbation (COPD)

## 2020-04-29 NOTE — Patient Outreach (Signed)
Medicaid Managed Care   Nurse Care Manager Note  04/29/2020 Name:  Jody Mcclure MRN:  [id number] DOB:  1956-02-27  Jody Mcclure is an 65 y.o. year old female who is a primary patient of Jody Kanner, MD.  The [facility] Managed Care Coordination team was consulted for assistance with:    COPD  Jody Mcclure was given information about Medicaid Managed Care Coordination team services today. Jody Mcclure agreed to services and verbal consent obtained.  Engaged with patient by telephone for initial visit in response to provider referral for case management and/or care coordination services.   Assessments/Interventions:  Review of past medical history, allergies, medications, health status, including review of consultants reports, laboratory and other test data, was performed as part of comprehensive evaluation and provision of chronic care management services.  SDOH (Social Determinants of Health) assessments and interventions performed:   Care Plan  Allergies  Allergen Reactions  . Codeine Nausea And Vomiting  . Sulfa Antibiotics Rash    Medications Reviewed Today    Reviewed by Heidi Dach, RN (Registered Nurse) on 04/29/20 at [phone]  Med List Status: <None>  Medication Order Taking? Sig Documenting Provider Last Dose Status Informant  albuterol (PROVENTIL) (2.5 MG/3ML) 0.083% nebulizer solution [id number] Yes TAKE 3 MLS (2.5 MG TOTAL) BY NEBULIZATION 2 (TWO) TIMES DAILY AS NEEDED FOR WHEEZING OR SHORTNESS OF BREATH. Jody Lennert, MD Taking Active   amLODipine (NORVASC) 5 MG tablet [id number] Yes Take 1 tablet (5 mg total) by mouth daily. Jody Kanner, MD Taking Expired 03/29/20 2359   betamethasone valerate ointment (VALISONE) 0.1 % [id number] Yes APPLY TO AFFECTED AREA TWICE A Jody Dresser, MD Taking Active   hydrOXYzine (ATARAX/VISTARIL) 25 MG tablet [id number] Yes TAKE 1 TABLET BY MOUTH THREE TIMES A DAY AS NEEDED Jody Bottom, MD Taking Active   Incontinence Supply  Disposable (DEPEND EASY FIT UNDERGARMENTS) MISC [id number] Yes 1 Units by Does not apply route 3 (three) times daily. Jody Phenes, DO Taking Active Self        Discontinued 04/09/13 1312 (Reorder)   polyethylene glycol (MIRALAX / GLYCOLAX) 17 g packet [id number] No Take 17 g by mouth daily.  Patient not taking: Reported on 04/29/2020   Jody Drop, DO Not Taking Active   pravastatin (PRAVACHOL) 40 MG tablet [id number] Yes Take 1 tablet (40 mg total) by mouth every evening. Jody Kanner, MD Taking Active   sertraline (ZOLOFT) 50 MG tablet [id number] Yes TAKE 3 TABLETS BY MOUTH EVERY DAY Jody Lennert, MD Taking Active   Tiotropium Bromide-Olodaterol (STIOLTO RESPIMAT) 2.5-2.5 MCG/ACT AERS [id number] Yes Inhale into the lungs. [provider] Taking Active           Patient Active Problem List   Diagnosis Date Noted  . Hypercholesterolemia with hypertriglyceridemia 12/31/2019  . Bilateral thumb pain 08/01/2019  . Musculoskeletal pain of right upper extremity 12/10/2018  . Hypokalemia 12/10/2018  . Sepsis (HCC) 11/21/2018  . Fever and chills   . Stress incontinence 04/01/2018  . COPD (chronic obstructive pulmonary disease) (HCC) 03/05/2018  . Mild dementia (HCC) 11/15/2017  . Constipation 11/15/2017  . Encounter for screening mammogram for breast cancer 06/03/2016  . Lumbar radiculopathy, chronic 06/03/2016  . Generalized anxiety disorder 06/03/2016  . Healthcare maintenance 06/03/2016  . GERD (gastroesophageal reflux disease) 12/13/2012  . Depression 12/13/2012  . Dysphagia 04/18/2012  . Hypertension 08/15/2011  . Rash and nonspecific skin eruption 08/15/2011    Conditions to be addressed/monitored per PCP order:  COPD  Care Plan : COPD (Adult)  Updates made by Heidi Dach, RN since 04/29/2020 12:00 AM    Problem: Disease Progression (COPD)     Long-Range Goal: Disease Progression Minimized or Managed   Start Date: 04/29/2020  Expected End Date:  06/29/2020  This Visit's Progress: On track  Priority: High  Note:   Current Barriers:  . Chronic Disease Management support and education needs related to COPD. Ms Koepke manages her COPD with a twice daily inhaler and nebulizer treatment as needed, light walks daily and getting adequate rest. She reports using the nebulizer about once per week. She is having difficulty eating a healthy diet due to needing dentures. . Corporate treasurer.  . Transportation barriers  Nurse Case Manager Clinical Goal(s):  Jody Mcclure Over the next 60 days, patient will verbalize understanding of plan for COPD . Over the next 60 days, patient will work with [facility]([phone]) to address needs related to oral care/denture procurement . Over the next 60 days, the patient will demonstrate ongoing self health care management ability as evidenced by taking medications as prescribed, participating in light exercise as tolerated, avoiding triggers such as smoke and extreme temperature changes  Interventions:  . Inter-disciplinary care team collaboration (see longitudinal plan of care) . Evaluation of current treatment plan related to COPD and patient's adherence to plan as established by provider. . Advised patient to utilize transportation provided by [facility] for medical appointments . Reviewed medications with patient and discussed when to use inhaler and nebulizer, refill needed for inhaler . Collaborated with PCP regarding refill for inhaler . Discussed plans with patient for ongoing care management follow up and provided patient with direct contact information for care management team . Advised patient, providing education and rationale, to weigh daily and record, calling PCP for weight gain of 3lbs overnight or 5 pounds in a week.  Jody Mcclure Pharmacy referral for medication review  Patient Goals/Self-Care Activities Over the next 60 days, patient will: - change to whole grain breads, cereal, pasta - drink 6 to 8  glasses of water each day - fill half of plate with vegetables - limit fast food meals to no more than 1 per week - Schedule an appointment and attend for denture evaluation at [facility] [phone])  - Call [facility] transportation provided by [employer] [phone]) - call to cancel if needed - keep a calendar with prescription refill dates - keep a calendar with appointment dates  - avoid second hand smoke - identify and remove indoor air pollutants - limit outdoor activity during cold weather  Follow Up Plan: Telephone follow up appointment with Managed Medicaid care management team member scheduled for:06/29/20 @ 9am       Problem: Symptom Exacerbation (COPD)       Follow Up:  Patient agrees to Care Plan and Follow-up.  Plan: The Managed Medicaid care management team will reach out to the patient again over the next 60 days.  Date/time of next scheduled RN care management/care coordination outreach:  06/29/20 @ 9am  [name] RN, BSN [place]  [employer]

## 2020-05-06 ENCOUNTER — Other Ambulatory Visit: Payer: Self-pay

## 2020-05-11 ENCOUNTER — Other Ambulatory Visit: Payer: Self-pay

## 2020-05-11 NOTE — Patient Outreach (Signed)
Medicaid Managed Care    Pharmacy Note  05/11/2020 Name: Jody Mcclure MRN: [id number] DOB: Feb 16, 1956  Jody Mcclure is a 65 y.o. year old female who is a primary care patient of Evlyn Kanner, MD. [facility] Managed Care Coordination team was consulted for assistance with disease management and care coordination needs.    Engaged with patient Engaged with patient by telephone for initial visit in response to referral for case management and/or care coordination services.  Jody Mcclure was given information about Managed Medicaid Care Coordination team services today. Jody Mcclure agreed to services and verbal consent obtained.   Objective:  Lab Results  Component Value Date   CREATININE 0.65 12/30/2019   CREATININE 0.85 12/10/2018   CREATININE 0.83 11/25/2018    Lab Results  Component Value Date   HGBA1C 5.3 12/30/2019       Component Value Date/Time   CHOL 324 (H) 12/30/2019 1436   TRIG 575 (HH) 12/30/2019 1436   HDL 31 (L) 12/30/2019 1436   CHOLHDL 10.5 (H) 12/30/2019 1436   CHOLHDL 4.7 08/05/2014 1509   VLDL 32 08/05/2014 1509   LDLCALC 175 (H) 12/30/2019 1436    Other: (TSH, CBC, Vit D, etc.)  Clinical ASCVD: No  The ASCVD Risk score Denman George DC Jr., et al., 2013) failed to calculate for the following reasons:   The valid total cholesterol range is 130 to 320 mg/dL    Other: ([id number] if Afib, PHQ9 if depression, MMRC or CAT for COPD, ACT, DEXA)  BP Readings from Last 3 Encounters:  12/30/19 135/79  08/01/19 125/84  12/10/18 123/85    Assessment/Interventions: Review of patient past medical history, allergies, medications, health status, including review of consultants reports, laboratory and other test data, was performed as part of comprehensive evaluation and provision of chronic care management services.   Cardio BP Readings from Last 3 Encounters:  12/30/19 135/79  08/01/19 125/84  12/10/18 123/85   Amlodipine 5mg  Plan: At goal,  patient stable/  symptoms controlled   Lipids Lab Results  Component Value Date   CHOL 324 (H) 12/30/2019   CHOL 253 (H) 08/05/2014   CHOL 277 (H) 04/17/2013   Lab Results  Component Value Date   HDL 31 (L) 12/30/2019   HDL 54 08/05/2014   HDL 61 04/17/2013   Lab Results  Component Value Date   LDLCALC 175 (H) 12/30/2019   LDLCALC 167 (H) 08/05/2014   LDLCALC 152 (H) 04/17/2013   Lab Results  Component Value Date   TRIG 575 (HH) 12/30/2019   TRIG 150 (H) 11/21/2018   TRIG 158 (H) 08/05/2014   Lab Results  Component Value Date   CHOLHDL 10.5 (H) 12/30/2019   CHOLHDL 4.7 08/05/2014   CHOLHDL 4.5 04/17/2013   No results found for: LDLDIRECT Pravastatin 40mg  Plan: At goal,  patient stable/ symptoms controlled   Mood Depression screen [facility] 2/9 12/30/2019 08/01/2019 12/10/2018  Decreased Interest 1 1 1   Down, Depressed, Hopeless 2 1 1   PHQ - 2 Score 3 2 2   Altered sleeping 2 1 1   Tired, decreased energy 2 3 1   Change in appetite 2 2 0  Feeling bad or failure about yourself  2 1 0  Trouble concentrating 2 3 0  Moving slowly or fidgety/restless 3 3 3   Suicidal thoughts 0 0 0  PHQ-9 Score 16 15 7   Difficult doing work/chores Somewhat difficult Somewhat difficult Not difficult at all  Some recent data might be hidden   Sertraline 50mg   Plan: Patient stated her first and only goal was breathing, will focus on this then move to mood. They decrease in PHQ could be associated with lack of freedom due to breathing   COPD CAT Score: 05/11/20: 22 No flowsheet data found.  Stiolto BID Albuterol Neb Plan: Wants to try Trelegy. NF per insurance, will ask PCP to send in script and do PA  SDOH (Social Determinants of Health) assessments and interventions performed:    Care Plan  Allergies  Allergen Reactions  . Codeine Nausea And Vomiting  . Sulfa Antibiotics Rash    Medications Reviewed Today    Reviewed by Heidi Dach, RN (Registered Nurse) on 04/29/20 at [phone]  Med List Status:  <None>  Medication Order Taking? Sig Documenting Provider Last Dose Status Informant  albuterol (PROVENTIL) (2.5 MG/3ML) 0.083% nebulizer solution [id number] Yes TAKE 3 MLS (2.5 MG TOTAL) BY NEBULIZATION 2 (TWO) TIMES DAILY AS NEEDED FOR WHEEZING OR SHORTNESS OF BREATH. Verdene Lennert, MD Taking Active   amLODipine (NORVASC) 5 MG tablet [id number] Yes Take 1 tablet (5 mg total) by mouth daily. Evlyn Kanner, MD Taking Expired 03/29/20 2359   betamethasone valerate ointment (VALISONE) 0.1 % [id number] Yes APPLY TO AFFECTED AREA TWICE A Junious Dresser, MD Taking Active   hydrOXYzine (ATARAX/VISTARIL) 25 MG tablet [id number] Yes TAKE 1 TABLET BY MOUTH THREE TIMES A DAY AS NEEDED Eliezer Bottom, MD Taking Active   Incontinence Supply Disposable (DEPEND EASY FIT UNDERGARMENTS) MISC [id number] Yes 1 Units by Does not apply route 3 (three) times daily. Camelia Phenes, DO Taking Active Self        Discontinued 04/09/13 1312 (Reorder)   polyethylene glycol (MIRALAX / GLYCOLAX) 17 g packet [id number] No Take 17 g by mouth daily.  Patient not taking: Reported on 04/29/2020   Darlin Drop, DO Not Taking Active   pravastatin (PRAVACHOL) 40 MG tablet [id number] Yes Take 1 tablet (40 mg total) by mouth every evening. Evlyn Kanner, MD Taking Active   sertraline (ZOLOFT) 50 MG tablet [id number] Yes TAKE 3 TABLETS BY MOUTH EVERY DAY Verdene Lennert, MD Taking Active   Tiotropium Bromide-Olodaterol (STIOLTO RESPIMAT) 2.5-2.5 MCG/ACT AERS [id number] Yes Inhale into the lungs. [provider] Taking Active           Patient Active Problem List   Diagnosis Date Noted  . Hypercholesterolemia with hypertriglyceridemia 12/31/2019  . Bilateral thumb pain 08/01/2019  . Musculoskeletal pain of right upper extremity 12/10/2018  . Hypokalemia 12/10/2018  . Sepsis (HCC) 11/21/2018  . Fever and chills   . Stress incontinence 04/01/2018  . COPD (chronic obstructive pulmonary disease) (HCC) 03/05/2018   . Mild dementia (HCC) 11/15/2017  . Constipation 11/15/2017  . Encounter for screening mammogram for breast cancer 06/03/2016  . Lumbar radiculopathy, chronic 06/03/2016  . Generalized anxiety disorder 06/03/2016  . Healthcare maintenance 06/03/2016  . GERD (gastroesophageal reflux disease) 12/13/2012  . Depression 12/13/2012  . Dysphagia 04/18/2012  . Hypertension 08/15/2011  . Rash and nonspecific skin eruption 08/15/2011    Conditions to be addressed/monitored: Pulmonary Disease  Care Plan : Medication Management  Updates made by Zettie Pho, RPH since 05/11/2020 12:00 AM    Problem: Health Promotion or Disease Self-Management (General Plan of Care)     Goal: Self-Management Plan Developed   Note:   Current Barriers:  . Unable to achieve control of COPD  o Difficult to do errands . Lack of transportation o [facility] o Because of this she  has to walk everywhere and her COPD prevents this  Pharmacist Clinical Goal(s):  Marland Kitchen Over the next 30 days, patient will contact provider office for questions/concerns as evidenced notation of same in electronic health record through collaboration with PharmD and provider.  .   Interventions: . Inter-disciplinary care team collaboration (see longitudinal plan of care) . Comprehensive medication review performed; medication list updated in electronic medical record  @RXCPCOPD @  Patient Goals/Self-Care Activities . Over the next 30 days, patient will:  - collaborate with provider on medication access solutions  Follow Up Plan: The care management team will reach out to the patient again over the next 30 days.     Task: Mutually Develop and Achievement of Patient Goals   Note:   Care Management Activities:    - verbalization of feelings encouraged    Notes:       Medication Assistance: Wants trial of Trelegy but insurance doesn't cover it  Follow up: Agree/   Plan: The care management team  will reach out to the patient again over the next 30 days.   Malen Gauze, Pharm.D., Managed Medicaid Pharmacist - [phone]

## 2020-05-11 NOTE — Patient Instructions (Signed)
Visit Information  Ms. Yodice was given information about Medicaid Managed Care team care coordination services as a part of their [facility] Medicaid benefit. GLADY OUDERKIRK verbally consented to engagement with the [facility] Managed Care team.   For questions related to your [facility] health plan, please call: [phone]  If you would like to schedule transportation through your [facility] plan, please call the following number at least 2 days in advance of your appointment: [phone]  Ms. Rizor - following are the goals we discussed in your visit today:  Goals Addressed   None     Please see education materials related to COPD provided as print materials.   Patient verbalizes understanding of instructions provided today.   The Managed Medicaid care management team will reach out to the patient again over the next 30 days.   Zettie Pho, [facility]  Following is a copy of your plan of care:  Patient Care Plan: COPD (Adult)    Problem Identified: Disease Progression (COPD)     Long-Range Goal: Disease Progression Minimized or Managed   Start Date: 04/29/2020  Expected End Date: 06/29/2020  This Visit's Progress: On track  Priority: High  Note:   Current Barriers:  . Chronic Disease Management support and education needs related to COPD. Ms Cihlar manages her COPD with a twice daily inhaler and nebulizer treatment as needed, light walks daily and getting adequate rest. She reports using the nebulizer about once per week. She is having difficulty eating a healthy diet due to needing dentures. . Corporate treasurer.  . Transportation barriers  Nurse Case Manager Clinical Goal(s):  Marland Kitchen Over the next 60 days, patient will verbalize understanding of plan for COPD . Over the next 60 days, patient will work with [phone]) to address needs related to oral care/denture procurement . Over the next 60 days, the patient will demonstrate ongoing self health care  management ability as evidenced by taking medications as prescribed, participating in light exercise as tolerated, avoiding triggers such as smoke and extreme temperature changes  Interventions:  . Inter-disciplinary care team collaboration (see longitudinal plan of care) . Evaluation of current treatment plan related to COPD and patient's adherence to plan as established by provider. . Advised patient to utilize transportation provided by [facility] for medical appointments . Reviewed medications with patient and discussed when to use inhaler and nebulizer, refill needed for inhaler . Collaborated with PCP regarding refill for inhaler . Discussed plans with patient for ongoing care management follow up and provided patient with direct contact information for care management team . Advised patient, providing education and rationale, to weigh daily and record, calling PCP for weight gain of 3lbs overnight or 5 pounds in a week.  Marland Kitchen Pharmacy referral for medication review  Patient Goals/Self-Care Activities Over the next 60 days, patient will: - change to whole grain breads, cereal, pasta - drink 6 to 8 glasses of water each day - fill half of plate with vegetables - limit fast food meals to no more than 1 per week - Schedule an appointment and attend for denture evaluation at [facility] [phone])  - Call [facility] transportation provided by [employer] [phone]) - call to cancel if needed - keep a calendar with prescription refill dates - keep a calendar with appointment dates  - avoid second hand smoke - identify and remove indoor air pollutants - limit outdoor activity during cold weather  Follow Up Plan: Telephone follow up appointment with Managed Medicaid care management team  member scheduled for:06/29/20 @ 9am       Problem Identified: Symptom Exacerbation (COPD)     Patient Care Plan: Medication Management    Problem Identified: Health Promotion or Disease  Self-Management (General Plan of Care)     Goal: Self-Management Plan Developed   Note:   Current Barriers:  . Unable to achieve control of COPD  o Difficult to do errands . Lack of transportation o [facility] for her o Because of this she has to walk everywhere and her COPD prevents this  Pharmacist Clinical Goal(s):  Marland Kitchen Over the next 30 days, patient will contact provider office for questions/concerns as evidenced notation of same in electronic health record through collaboration with PharmD and provider.  .   Interventions: . Inter-disciplinary care team collaboration (see longitudinal plan of care) . Comprehensive medication review performed; medication list updated in electronic medical record  @RXCPCOPD @  Patient Goals/Self-Care Activities . Over the next 30 days, patient will:  - collaborate with provider on medication access solutions  Follow Up Plan: The care management team will reach out to the patient again over the next 30 days.     Task: Mutually Develop and Achievement of Patient Goals   Note:   Care Management Activities:    - verbalization of feelings encouraged    Notes:

## 2020-05-12 DIAGNOSIS — Z419 Encounter for procedure for purposes other than remedying health state, unspecified: Secondary | ICD-10-CM | POA: Diagnosis not present

## 2020-05-28 ENCOUNTER — Other Ambulatory Visit: Payer: Self-pay | Admitting: Internal Medicine

## 2020-05-28 ENCOUNTER — Other Ambulatory Visit: Payer: Self-pay

## 2020-05-28 DIAGNOSIS — I10 Essential (primary) hypertension: Secondary | ICD-10-CM

## 2020-05-28 NOTE — Telephone Encounter (Signed)
  amLODipine (NORVASC) 5 MG tablet(Expired, REFILL REQUEST @  [id number] [place], [place] - [address] Phone:  [phone]  Fax:  [phone]

## 2020-06-08 ENCOUNTER — Other Ambulatory Visit: Payer: Self-pay

## 2020-06-08 NOTE — Patient Outreach (Signed)
Care Coordination  06/08/2020  LISET MCMONIGLE 03/07/1956 [id number]   Medicaid Managed Care   Unsuccessful Outreach Note  06/08/2020 Name: SHAUNIE BOEHM MRN: [id number] DOB: 04/01/1956  Referred by: Evlyn Kanner, MD Reason for referral : High Risk Managed Medicaid (MM Screener Telephone Outreach)   An unsuccessful telephone outreach was attempted today. The patient was referred to the case management team for assistance with care management and care coordination.   Follow Up Plan: Patient stated it was not a good time and to call back on 06/15/20 at 11:30.  Gus Puma, BSW, [place] [facility]  [place]  High Risk Managed Medicaid Team

## 2020-06-08 NOTE — Patient Instructions (Signed)
Visit Information  Ms. [name]  - as a part of your Medicaid benefit, you are eligible for care management and care coordination services at no cost or copay. I was unable to reach you by phone today but would be happy to help you with your health related needs. Please feel free to call me @ [phone].   A member of the Managed Medicaid care management team will reach out to you again over the next 7 days.   Gus Puma, BSW, [place] [facility]  [place]  High Risk Managed Medicaid Team

## 2020-06-09 ENCOUNTER — Other Ambulatory Visit: Payer: Self-pay

## 2020-06-09 DIAGNOSIS — Z419 Encounter for procedure for purposes other than remedying health state, unspecified: Secondary | ICD-10-CM | POA: Diagnosis not present

## 2020-06-16 ENCOUNTER — Other Ambulatory Visit: Payer: Self-pay

## 2020-06-16 NOTE — Patient Instructions (Signed)
Visit Information  Thank you for speaking with me today. Again please go to [employer] located at [employer], [place]. They can be reached at [phone]. They are open Tuesday-Friday 10am-1pm.  Jody Mcclure was given information about Medicaid Managed Care team care coordination services as a part of their [facility] Medicaid benefit. Jody Mcclure verbally consented to engagement with the [facility] Managed Care team.   For questions related to your [facility] health plan, please call: [phone]  If you would like to schedule transportation through your [facility] plan, please call the following number at least 2 days in advance of your appointment: [phone]   Jody Mcclure - following are the goals we discussed in your visit today:  Goals Addressed   None       Social Worker will follow up in 30 days.   Jody Mcclure  Following is a copy of your plan of care:

## 2020-06-16 NOTE — Patient Outreach (Signed)
Medicaid Managed Care Social Work Note  06/16/2020 Name:  Jody Mcclure MRN:  [id number] DOB:  18-Apr-1955  Jody Mcclure is an 65 y.o. year old female who is a primary patient of Evlyn Kanner, MD.  The [facility] Managed Care Coordination team was consulted for assistance with:  Food Insecurity  Ms. Hofmeister was given information about Medicaid Managed CareCoordination services today. [name] agreed to services and verbal consent obtained.  Engaged with patient  for by telephone forinitial visit in response to referral for case management and/or care coordination services.   Assessments/Interventions:  Review of past medical history, allergies, medications, health status, including review of consultants reports, laboratory and other test data, was performed as part of comprehensive evaluation and provision of chronic care management services.  SDOH: (Social Determinant of Health) assessments and interventions performed:  . Patient stated she was in need of food, she does receieve foodstamps and has someone to take her grocery shopping. Patient stated she was not interested in MOW but was interested in food pantries.    . Provided patient with information about [employer] [facility] [employer] [phone] located at [employer] in [place]. BSW contacted them and rep stated they have plenty of food and please come and get it. . BSW researched and was unable to find any food pantries that delivered. Patient stated no other resources are needed at this time.    Advanced Directives Status:  Not addressed in this encounter.  Care Plan                 Allergies  Allergen Reactions  . Codeine Nausea And Vomiting  . Sulfa Antibiotics Rash    Medications Reviewed Today    Reviewed by Heidi Dach, RN (Registered Nurse) on 04/29/20 at [phone]  Med List Status: <None>  Medication Order Taking? Sig Documenting Provider Last Dose Status Informant  albuterol (PROVENTIL) (2.5 MG/3ML) 0.083%  nebulizer solution [id number] Yes TAKE 3 MLS (2.5 MG TOTAL) BY NEBULIZATION 2 (TWO) TIMES DAILY AS NEEDED FOR WHEEZING OR SHORTNESS OF BREATH. Verdene Lennert, MD Taking Active   amLODipine (NORVASC) 5 MG tablet [id number] Yes Take 1 tablet (5 mg total) by mouth daily. Evlyn Kanner, MD Taking Active   betamethasone valerate ointment (VALISONE) 0.1 % [id number] Yes APPLY TO AFFECTED AREA TWICE A Junious Dresser, MD Taking Active   hydrOXYzine (ATARAX/VISTARIL) 25 MG tablet [id number] Yes TAKE 1 TABLET BY MOUTH THREE TIMES A DAY AS NEEDED Eliezer Bottom, MD Taking Active   Incontinence Supply Disposable (DEPEND EASY FIT UNDERGARMENTS) MISC [id number] Yes 1 Units by Does not apply route 3 (three) times daily. Camelia Phenes, DO Taking Active Self        Discontinued 04/09/13 1312 (Reorder)   polyethylene glycol (MIRALAX / GLYCOLAX) 17 g packet [id number] No Take 17 g by mouth daily.  Patient not taking: Reported on 04/29/2020   Darlin Drop, DO Not Taking Active   pravastatin (PRAVACHOL) 40 MG tablet [id number] Yes Take 1 tablet (40 mg total) by mouth every evening. Evlyn Kanner, MD Taking Active   sertraline (ZOLOFT) 50 MG tablet [id number] Yes TAKE 3 TABLETS BY MOUTH EVERY DAY Verdene Lennert, MD Taking Active   Tiotropium Bromide-Olodaterol (STIOLTO RESPIMAT) 2.5-2.5 MCG/ACT AERS [id number] Yes Inhale into the lungs. [provider] Taking Active           Patient Active Problem List   Diagnosis Date Noted  . Hypercholesterolemia with hypertriglyceridemia 12/31/2019  . Bilateral thumb pain  08/01/2019  . Musculoskeletal pain of right upper extremity 12/10/2018  . Hypokalemia 12/10/2018  . Sepsis (HCC) 11/21/2018  . Fever and chills   . Stress incontinence 04/01/2018  . COPD (chronic obstructive pulmonary disease) (HCC) 03/05/2018  . Mild dementia (HCC) 11/15/2017  . Constipation 11/15/2017  . Encounter for screening mammogram for breast cancer 06/03/2016  . Lumbar  radiculopathy, chronic 06/03/2016  . Generalized anxiety disorder 06/03/2016  . Healthcare maintenance 06/03/2016  . GERD (gastroesophageal reflux disease) 12/13/2012  . Depression 12/13/2012  . Dysphagia 04/18/2012  . Hypertension 08/15/2011  . Rash and nonspecific skin eruption 08/15/2011    Conditions to be addressed/monitored per PCP order:  food  There are no care plans that you recently modified to display for this patient.   Follow up:  Patient agrees to Care Plan and Follow-up.  Plan: The Managed Medicaid care management team will reach out to the patient again over the next 30 days.  Date/time of next scheduled Social Work care management/care coordination outreach:  07/17/20  Gus Puma, BSW, [facility]  [place]  High Risk Managed Medicaid Team

## 2020-06-20 ENCOUNTER — Other Ambulatory Visit: Payer: Self-pay | Admitting: Internal Medicine

## 2020-06-20 ENCOUNTER — Other Ambulatory Visit: Payer: Self-pay | Admitting: Student

## 2020-06-20 DIAGNOSIS — R0609 Other forms of dyspnea: Secondary | ICD-10-CM

## 2020-06-20 DIAGNOSIS — M159 Polyosteoarthritis, unspecified: Secondary | ICD-10-CM

## 2020-06-20 DIAGNOSIS — R0600 Dyspnea, unspecified: Secondary | ICD-10-CM

## 2020-06-20 DIAGNOSIS — F411 Generalized anxiety disorder: Secondary | ICD-10-CM

## 2020-06-29 ENCOUNTER — Other Ambulatory Visit: Payer: Self-pay | Admitting: *Deleted

## 2020-06-29 ENCOUNTER — Other Ambulatory Visit: Payer: Self-pay

## 2020-06-29 NOTE — Patient Outreach (Signed)
Medicaid Managed Care   Nurse Care Manager Note  06/29/2020 Name:  HILARI WETHINGTON MRN:  [id number] DOB:  1955-10-29  KALEYAH LABRECK is an 65 y.o. year old female who is a primary patient of Evlyn Kanner, MD.  The [facility] Managed Care Coordination team was consulted for assistance with:    HTN COPD  Ms. Knepp was given information about Medicaid Managed Care Coordination team services today. [name] agreed to services and verbal consent obtained.  Engaged with patient by telephone for follow up visit in response to provider referral for case management and/or care coordination services.   Assessments/Interventions:  Review of past medical history, allergies, medications, health status, including review of consultants reports, laboratory and other test data, was performed as part of comprehensive evaluation and provision of chronic care management services.  SDOH (Social Determinants of Health) assessments and interventions performed:   Care Plan  Allergies  Allergen Reactions  . Codeine Nausea And Vomiting  . Sulfa Antibiotics Rash    Medications Reviewed Today    Reviewed by Heidi Dach, RN (Registered Nurse) on 06/29/20 at [phone]  Med List Status: <None>  Medication Order Taking? Sig Documenting Provider Last Dose Status Informant  albuterol (PROVENTIL) (2.5 MG/3ML) 0.083% nebulizer solution [id number] Yes TAKE 3 MLS (2.5 MG TOTAL) BY NEBULIZATION 2 (TWO) TIMES DAILY AS NEEDED FOR WHEEZING OR SHORTNESS OF BREATH. Evlyn Kanner, MD Taking Active   amLODipine (NORVASC) 5 MG tablet [id number] Yes TAKE 1 TABLET BY MOUTH EVERY DAY Katsadouros, Vasilios, MD Taking Active   betamethasone valerate ointment (VALISONE) 0.1 % [id number] Yes APPLY TO AFFECTED AREA TWICE A Junious Dresser, MD Taking Active   diclofenac Sodium (VOLTAREN) 1 % GEL [id number] Yes APPLY 2 G TOPICALLY 4 (FOUR) TIMES DAILY FOR 90 DOSES. Evlyn Kanner, MD Taking Active   hydrOXYzine (ATARAX/VISTARIL) 25 MG  tablet [id number] Yes TAKE 1 TABLET BY MOUTH THREE TIMES A DAY AS NEEDED Eliezer Bottom, MD Taking Active   Incontinence Supply Disposable (DEPEND EASY FIT UNDERGARMENTS) MISC [id number] Yes 1 Units by Does not apply route 3 (three) times daily. Camelia Phenes, DO Taking Active Self        Discontinued 04/09/13 1312 (Reorder)   polyethylene glycol (MIRALAX / GLYCOLAX) 17 g packet [id number] No Take 17 g by mouth daily.  Patient not taking: No sig reported   Darlin Drop, DO Not Taking Active   pravastatin (PRAVACHOL) 40 MG tablet [id number] Yes Take 1 tablet (40 mg total) by mouth every evening. Evlyn Kanner, MD Taking Active   sertraline (ZOLOFT) 50 MG tablet [id number] Yes TAKE 3 TABLETS BY MOUTH EVERY DAY Evlyn Kanner, MD Taking Active   Tiotropium Bromide-Olodaterol (STIOLTO RESPIMAT) 2.5-2.5 MCG/ACT AERS [id number] Yes Inhale into the lungs. [provider] Taking Active           Patient Active Problem List   Diagnosis Date Noted  . Hypercholesterolemia with hypertriglyceridemia 12/31/2019  . Bilateral thumb pain 08/01/2019  . Musculoskeletal pain of right upper extremity 12/10/2018  . Hypokalemia 12/10/2018  . Sepsis (HCC) 11/21/2018  . Fever and chills   . Stress incontinence 04/01/2018  . COPD (chronic obstructive pulmonary disease) (HCC) 03/05/2018  . Mild dementia (HCC) 11/15/2017  . Constipation 11/15/2017  . Encounter for screening mammogram for breast cancer 06/03/2016  . Lumbar radiculopathy, chronic 06/03/2016  . Generalized anxiety disorder 06/03/2016  . Healthcare maintenance 06/03/2016  . GERD (gastroesophageal reflux disease) 12/13/2012  . Depression 12/13/2012  . Dysphagia 04/18/2012  .  Hypertension 08/15/2011  . Rash and nonspecific skin eruption 08/15/2011    Conditions to be addressed/monitored per PCP order:  HTN and COPD  Care Plan : COPD (Adult)  Updates made by Heidi Dach, RN since 06/29/2020 12:00 AM    Problem: Disease  Progression (COPD)     Long-Range Goal: Disease Progression Minimized or Managed   Start Date: 04/29/2020  Expected End Date: 08/28/2020  This Visit's Progress: On track  Recent Progress: On track  Priority: High  Note:   Current Barriers:  . Chronic Disease Management support and education needs related to COPD. Ms Troung manages her COPD with a twice daily inhaler and nebulizer treatment as needed, light walks daily and getting adequate rest. She reports using the nebulizer about once per week. She is having difficulty eating a healthy diet due to needing dentures.-Update-Ms. Sanville is experiencing cough, congestion and hoarseness after sleeping with her window open. She denies fever. She does not have any medication to treat the symptoms and has not contacted her physician. She had to reschedule her dental appointment to April 5 after missing an appointment in March due to arranged transportation not picking her up.  . Corporate treasurer.  . Transportation barriers  Nurse Case Manager Clinical Goal(s):  Marland Kitchen Over the next 60 days, patient will verbalize understanding of plan for COPD . Over the next 60 days, patient will work with [facility]([phone]) to address needs related to oral care/denture procurement-Appointment April 5 . Over the next 60 days, the patient will demonstrate ongoing self health care management ability as evidenced by taking medications as prescribed, participating in light exercise as tolerated, avoiding triggers such as smoke and extreme temperature changes  Interventions:  . Inter-disciplinary care team collaboration (see longitudinal plan of care) . Evaluation of current treatment plan related to COPD and patient's adherence to plan as established by provider. . Advised patient to utilize transportation provided by [facility] for medical appointments, [employer] [phone]) . Reviewed medications with patient and discussed patient needing medication for congestion  and cough . Collaborated with PCP regarding new symptoms . Discussed plans with patient for ongoing care management follow up and provided patient with direct contact information for care management team . Advised patient, providing education and rationale, to weigh daily and record, calling PCP for weight gain of 3lbs overnight or 5 pounds in a week.  Wendall Stade with pharmacy regarding medication delivery-[facility] now charging to deliver  Patient Goals/Self-Care Activities Over the next 60 days, patient will: -Schedule an appointment and attend for denture evaluation at [facility] [phone]) -appointment 07/14/2020 - change to whole grain breads, cereal, pasta - drink 6 to 8 glasses of water each day - fill half of plate with vegetables - limit fast food meals to no more than 1 per week  - Call [facility] transportation provided by [employer] [phone]), call one week before your scheduled appointment - call to cancel if needed - keep a calendar with prescription refill dates - keep a calendar with appointment dates  - contact your PCP with questions or concerns - avoid second hand smoke - identify and remove indoor air pollutants - limit outdoor activity during cold weather       Follow Up Plan: Telephone follow up appointment with Managed Medicaid care management team member scheduled for:08/28/20 @ 9am         Follow Up:  Patient agrees to Care Plan and Follow-up.  Plan: The Managed Medicaid care management team will reach out  to the patient again over the next 60 days.  Date/time of next scheduled RN care management/care coordination outreach: 08/28/20 @ 9am  [name] RN, BSN [place]  [facility]

## 2020-06-29 NOTE — Patient Instructions (Signed)
Visit Information  Jody. Capek was given information about Medicaid Managed Care team care coordination services as a part of their [facility] Medicaid benefit. Jody Mcclure verbally consented to engagement with the [facility] Managed Care team.   For questions related to your [facility] health plan, please call: [phone]  If you would like to schedule transportation through your [facility] plan, please call the following number at least 2 days in advance of your appointment: [phone]   Call the [facility] Crisis Line at [phone], at any time, 24 hours a day, 7 days a week. If you are in danger or need immediate medical attention call 911.  Jody Mcclure - following are the goals we discussed in your visit today:  Goals Addressed            This Visit's Progress   . Eat Healthy       Timeframe:  Long-Range Goal Priority:  High Start Date:   04/29/20                          Expected End Date:   08/28/20                  -Schedule an appointment and attend for [facility]ure evaluation at [facility] [phone]) -appointment 07/14/2020 - change to whole grain breads, cereal, pasta - drink 6 to 8 glasses of water each day - fill half of plate with vegetables - limit fast food meals to no more than 1 per week    Why is this important?    When you are ready to manage your nutrition or weight, having a plan and setting goals will help.   Taking small steps to change how you eat and exercise is a good place to start.        . Make and Keep All Appointments       Timeframe:  Long-Range Goal Priority:  Medium Start Date:  04/29/20                           Expected End Date: 08/28/20                     - Call [facility] transportation provided by [employer] [phone]), call one week before your scheduled appointment - call to cancel if needed - keep a calendar with prescription refill dates - keep a calendar with appointment dates    Why is this important?     Part of staying healthy is seeing the doctor for follow-up care.   If you forget your appointments, there are some things you can do to stay on track.        . Track and Manage My Triggers-COPD       Timeframe:  Long-Range Goal Priority:  High Start Date:  04/29/20                           Expected End Date: 08/28/20                      - contact your PCP with questions or concerns - avoid second hand smoke - identify and remove indoor air pollutants - limit outdoor activity during cold weather    Why is this important?    Triggers are activities or things, like tobacco smoke or cold weather, that make your  COPD (chronic obstructive pulmonary disease) flare-up.   Knowing these triggers helps you plan how to stay away from them.   When you cannot remove them, you can learn how to manage them.            Please see education materials related to COPD and sinus rinse provided as print materials.     COPD Action Plan A COPD action plan is a description of what to do when you have a flare (exacerbation) of chronic obstructive pulmonary disease (COPD). Your action plan is a color-coded plan that lists the symptoms that indicate whether your condition is under control and what actions to take.  If you have symptoms in the green zone, it means you are doing well that day.  If you have symptoms in the yellow zone, it means you are having a bad day or an exacerbation.  If you have symptoms in the red zone, you need urgent medical care. Follow the plan that you and your health care provider developed. Review your plan with your health care provider at each visit. Red zone Symptoms in this zone mean that you should get medical help right away. They include:  Feeling very short of breath, even when you are resting.  Not being able to do any activities because of poor breathing.  Not being able to sleep because of poor breathing.  Fever or shaking chills.  Feeling confused  or very sleepy.  Chest pain.  Coughing up blood. If you have any of these symptoms, call emergency services (911 in the [place] or go to the nearest emergency room.   Yellow zone Symptoms in this zone mean that your condition may be getting worse. They include:  Feeling more short of breath than usual.  Having less energy for daily activities than usual.  Phlegm or mucus that is thicker than usual.  Needing to use your rescue inhaler or nebulizer more often than usual.  More ankle swelling than usual.  Coughing more than usual.  Feeling like you have a chest cold.  Trouble sleeping due to COPD symptoms.  Decreased appetite.  COPD medicines not helping as much as usual. If you experience any "yellow" symptoms:  Keep taking your daily medicines as directed.  Use your quick-relief inhaler as told by your health care provider.  If you were prescribed steroid medicine to take by mouth (oral medicine), start taking it as told by your health care provider.  If you were prescribed an antibiotic medicine, start taking it as told by your health care provider. Do not stop taking the antibiotic even if you start to feel better.  Use oxygen as told by your health care provider.  Get more rest.  Do your pursed-lip breathing exercises.  Do not smoke. Avoid any irritants in the air. If your signs and symptoms do not improve after taking these steps, call your health care provider right away.   Green zone Symptoms in this zone mean that you are doing well. They include:  Being able to do your usual activities and exercise.  Having the usual amount of coughing, including the same amount of phlegm or mucus.  Being able to sleep well.  Having a good appetite.   Where to find more information: You can find more information about COPD from:  American Lung Association, My COPD Action Plan: www.lung.org  COPD Foundation: www.copdfoundation.org  National Heart, Lung, & Blood  Institute: PopSteam.is Follow these instructions at home:  Continue taking your daily medicines  as told by your health care provider.  Make sure you receive all the immunizations that your health care provider recommends, especially the pneumococcal and influenza vaccines.  Wash your hands often with soap and water. Have family members wash their hands too. Regular hand washing can help prevent infections.  Follow your usual exercise and diet plan.  Avoid irritants in the air, such as smoke.  Do not use any products that contain nicotine or tobacco. These products include cigarettes, chewing tobacco, and vaping devices, such as e-cigarettes. If you need help quitting, ask your health care provider. Summary  A COPD action plan tells you what to do when you have a flare (exacerbation) of chronic obstructive pulmonary disease (COPD).  Follow each action plan for your symptoms. If you have any symptoms in the red zone, call emergency services (911 in the [place] or go to the nearest emergency room. This information is not intended to replace advice given to you by your health care provider. Make sure you discuss any questions you have with your health care provider. Document Revised: 02/04/2020 Document Reviewed: 02/04/2020 Elsevier Patient Education  2021 Elsevier Inc.    How to Perform a Sinus Rinse A sinus rinse is a home treatment. It rinses your sinuses with a mixture of salt and water (saline solution). Sinuses are air-filled spaces in your skull behind the bones of your face and forehead. They open into your nasal cavity. A sinus rinse can help to clear your nasal cavity. It can clear mucus, dirt, dust, or pollen. You may do a sinus rinse when you have:  A cold.  A virus.  Allergies.  A sinus infection.  A stuffy nose. Talk with your doctor about whether a sinus rinse might help you. What are the risks? A sinus rinse is normally very safe and helpful. However, there  are a few risks. These include:  A burning feeling in the sinuses. This may happen if you do not make the saline solution as instructed. Be sure to follow all directions when making the saline solution.  Nasal irritation.  Infection from unclean water. This is rare, but possible. Do not do a sinus rinse if you have had:  Ear or nasal surgery.  An ear infection.  Blocked ears. Supplies needed:  Saline solution or powder.  Distilled or germ-free (sterile) water may be needed to mix with saline powder. ? You may use boiled and cooled tap water. Boil tap water for 5 minutes; cool until it is lukewarm. Use within 24 hours. ? Do not use regular tap water to mix with the saline solution.  Neti pot or nasal rinse bottle. This releases the saline solution into your nose and through your sinuses. You can buy neti pots and rinse bottles: ? At your local pharmacy. ? At a health food store. ? Online. How to perform a sinus rinse 1. Wash your hands with soap and water. 2. Wash your device using the directions that came with it. 3. Dry your device. 4. Use the solution that comes with your device or one that is sold separately in stores. Follow the mixing directions on the package if you need to mix with sterile or distilled water. 5. Fill your device with the amount of saline solution stated in the device instructions. 6. Stand over a sink and tilt your head sideways over the sink. 7. Place the spout of the device in your upper nostril (the one closer to the ceiling). 8. Gently pour or squeeze  the saline solution into your nasal cavity. The liquid should drain to your lower nostril if you are not too stuffed up (congested). 9. While rinsing, breathe through your open mouth. 10. Gently blow your nose to clear any mucus and rinse solution. Blowing too hard may cause ear pain. 11. Repeat in your other nostril. 12. Clean and rinse your device with clean water. 13. Air-dry your device. Talk with  your doctor or pharmacist if you have questions about how to do a sinus rinse.   Summary  A sinus rinse is a home treatment. It rinses your sinuses with a mixture of salt and water (saline solution).  A sinus rinse is normally very safe and helpful. Follow all instructions carefully.  Talk with your doctor about whether a sinus rinse might help you. This information is not intended to replace advice given to you by your health care provider. Make sure you discuss any questions you have with your health care provider. Document Revised: 01/07/2020 Document Reviewed: 01/07/2020 Elsevier Patient Education  2021 [employer].   The patient verbalized understanding of instructions provided today and agreed to receive a mailed copy of patient instruction and/or educational materials.  Telephone follow up appointment with Managed Medicaid care management team member scheduled for:08/28/20 @ 9am  [name] RN, BSN [place]  [facility] RN Care Coordinator    Following is a copy of your plan of care:  Patient Care Plan: COPD (Adult)    Problem Identified: Disease Progression (COPD)     Long-Range Goal: Disease Progression Minimized or Managed   Start Date: 04/29/2020  Expected End Date: 08/28/2020  This Visit's Progress: On track  Recent Progress: On track  Priority: High  Note:   Current Barriers:  . Chronic Disease Management support and education needs related to COPD. Jody Mcclure manages her COPD with a twice daily inhaler and nebulizer treatment as needed, light walks daily and getting adequate rest. She reports using the nebulizer about once per week. She is having difficulty eating a healthy diet due to needing dentures.-Update-Jody Mcclure is experiencing cough, congestion and hoarseness after sleeping with her window open. She denies fever. She does not have any medication to treat the symptoms and has not contacted her physician. She had to reschedule her dental  appointment to April 5 after missing an appointment in March due to arranged transportation not picking her up.  . Corporate treasurer.  . Transportation barriers  Nurse Case Manager Clinical Goal(s):  Marland Kitchen Over the next 60 days, patient will verbalize understanding of plan for COPD . Over the next 60 days, patient will work with [facility]([phone]) to address needs related to oral care/denture procurement-Appointment April 5 . Over the next 60 days, the patient will demonstrate ongoing self health care management ability as evidenced by taking medications as prescribed, participating in light exercise as tolerated, avoiding triggers such as smoke and extreme temperature changes  Interventions:  . Inter-disciplinary care team collaboration (see longitudinal plan of care) . Evaluation of current treatment plan related to COPD and patient's adherence to plan as established by provider. . Advised patient to utilize transportation provided by [facility] for medical appointments, [employer] [phone]) . Reviewed medications with patient and discussed patient needing medication for congestion and cough . Collaborated with PCP regarding new symptoms . Discussed plans with patient for ongoing care management follow up and provided patient with direct contact information for care management team . Advised patient, providing education and rationale, to weigh daily  and record, calling PCP for weight gain of 3lbs overnight or 5 pounds in a week.  Jody Mcclure. Collaborate with pharmacy regarding [facility] delivery-[facility] now [facility] to deliver  Patient Goals/Self-Care Activities Over the next 60 days, patient will: -Schedule an appointment and attend for [facility]ure evaluation at [facility] [phone]) -appointment 07/14/2020 - change to whole grain breads, cereal, pasta - drink 6 to 8 glasses of water each day - fill half of plate with vegetables - limit fast food meals to no more than 1 per week  - [facility] [employer] [phone]), call one week before your scheduled appointment - call to cancel if needed - keep a calendar with prescription refill dates - keep a calendar with appointment dates  - contact your PCP with questions or concerns - avoid second hand smoke - identify and remove indoor air pollutants - limit outdoor activity during cold weather       Follow Up Plan: Telephone follow up appointment with Managed Medicaid care management team member scheduled for:08/28/20 @ 9am       Problem Identified: Symptom Exacerbation (COPD)     Patient Care Plan: Medication Management    Problem Identified: Health Promotion or Disease Self-Management (General Plan of Care)     Goal: Self-Management Plan Developed   Note:   Current Barriers:  . Unable to achieve control of COPD  o Difficult to do errands . Lack of transportation o [facility] for her o Because of this she has to walk everywhere and her COPD prevents this  Pharmacist Clinical Goal(s):  Marland Kitchen. Over the next 30 days, patient will contact provider office for questions/concerns as evidenced notation of same in electronic health record through collaboration with PharmD and provider.  .   Interventions: . Inter-disciplinary care team collaboration (see longitudinal plan of care) . Comprehensive medication review performed; medication list updated in electronic medical record  @RXCPCOPD @  Patient Goals/Self-Care Activities . Over the next 30 days, patient will:  - collaborate with provider on medication access solutions  Follow Up Plan: The care management team will reach out to the patient again over the next 30 days.

## 2020-06-30 ENCOUNTER — Other Ambulatory Visit: Payer: Self-pay

## 2020-06-30 NOTE — Patient Outreach (Signed)
Medicaid Managed Care    Pharmacy Note  06/30/2020 Name: Jody Mcclure MRN: [id number] DOB: 10/13/1955  Jody Mcclure is a 65 y.o. year old female who is a primary care patient of Evlyn Kanner, MD. The [facility] Managed Care Coordination team was consulted for assistance with disease management and care coordination needs.    Engaged with patient Engaged with patient by telephone for initial visit in response to referral for case management and/or care coordination services.  [name] was given information about Managed Medicaid Care Coordination team services today. [name] agreed to services and verbal consent obtained.   Objective:  Lab Results  Component Value Date   CREATININE 0.65 12/30/2019   CREATININE 0.85 12/10/2018   CREATININE 0.83 11/25/2018    Lab Results  Component Value Date   HGBA1C 5.3 12/30/2019       Component Value Date/Time   CHOL 324 (H) 12/30/2019 1436   TRIG 575 (HH) 12/30/2019 1436   HDL 31 (L) 12/30/2019 1436   CHOLHDL 10.5 (H) 12/30/2019 1436   CHOLHDL 4.7 08/05/2014 1509   VLDL 32 08/05/2014 1509   LDLCALC 175 (H) 12/30/2019 1436    Other: (TSH, CBC, Vit D, etc.)  Clinical ASCVD: No  The ASCVD Risk score Denman George DC Jr., et al., 2013) failed to calculate for the following reasons:   The valid total cholesterol range is 130 to 320 mg/dL    Other: ([id number] if Afib, PHQ9 if depression, MMRC or CAT for COPD, ACT, DEXA)  BP Readings from Last 3 Encounters:  12/30/19 135/79  08/01/19 125/84  12/10/18 123/85    Assessment/Interventions: Review of patient past medical history, allergies, medications, health status, including review of consultants reports, laboratory and other test data, was performed as part of comprehensive evaluation and provision of chronic care management services.   Cardio BP Readings from Last 3 Encounters:  12/30/19 135/79  08/01/19 125/84  12/10/18 123/85   Amlodipine 5mg  Plan: At goal,  patient stable/  symptoms controlled   Lipids Lab Results  Component Value Date   CHOL 324 (H) 12/30/2019   CHOL 253 (H) 08/05/2014   CHOL 277 (H) 04/17/2013   Lab Results  Component Value Date   HDL 31 (L) 12/30/2019   HDL 54 08/05/2014   HDL 61 04/17/2013   Lab Results  Component Value Date   LDLCALC 175 (H) 12/30/2019   LDLCALC 167 (H) 08/05/2014   LDLCALC 152 (H) 04/17/2013   Lab Results  Component Value Date   TRIG 575 (HH) 12/30/2019   TRIG 150 (H) 11/21/2018   TRIG 158 (H) 08/05/2014   Lab Results  Component Value Date   CHOLHDL 10.5 (H) 12/30/2019   CHOLHDL 4.7 08/05/2014   CHOLHDL 4.5 04/17/2013   No results found for: LDLDIRECT Pravastatin 40mg  Plan: At goal,  patient stable/ symptoms controlled   Mood Depression screen [facility] 2/9 12/30/2019 08/01/2019 12/10/2018  Decreased Interest 1 1 1   Down, Depressed, Hopeless 2 1 1   PHQ - 2 Score 3 2 2   Altered sleeping 2 1 1   Tired, decreased energy 2 3 1   Change in appetite 2 2 0  Feeling bad or failure about yourself  2 1 0  Trouble concentrating 2 3 0  Moving slowly or fidgety/restless 3 3 3   Suicidal thoughts 0 0 0  PHQ-9 Score 16 15 7   Difficult doing work/chores Somewhat difficult Somewhat difficult Not difficult at all  Some recent data might be hidden   Sertraline 50mg   Plan: Patient stated her first and only goal was breathing, will focus on this then move to mood. They decrease in PHQ could be associated with lack of freedom due to breathing   COPD CAT Score: 05/11/20: 22 No flowsheet data found. Stiolto BID Albuterol Neb Plan: Wants to try Trelegy. NF per insurance, will ask PCP to send in script and do PA  M[facility], would like a Pharmacy that delivers for free Plan: Verbal consent obtained for [facility] enhanced pharmacy services (medication synchronization, adherence packaging, delivery coordination). A medication sync plan was created to allow patient to get all medications  delivered once every 30 to 90 days per patient preference. Patient understands they have freedom to choose pharmacy and clinical pharmacist will coordinate care between all prescribers and [facility].    SDOH (Social Determinants of Health) assessments and interventions performed:    Care Plan  Allergies  Allergen Reactions  . Codeine Nausea And Vomiting  . Sulfa Antibiotics Rash    Medications Reviewed Today    Reviewed by Zettie Pho, [facility] (Pharmacist) on 06/30/20 at 1450  Med List Status: <None>  Medication Order Taking? Sig Documenting Provider Last Dose Status Informant  albuterol (PROVENTIL) (2.5 MG/3ML) 0.083% nebulizer solution [id number] Yes TAKE 3 MLS (2.5 MG TOTAL) BY NEBULIZATION 2 (TWO) TIMES DAILY AS NEEDED FOR WHEEZING OR SHORTNESS OF BREATH. Evlyn Kanner, MD Taking Active   amLODipine (NORVASC) 5 MG tablet [id number] Yes TAKE 1 TABLET BY MOUTH EVERY DAY Katsadouros, Vasilios, MD Taking Active   betamethasone valerate ointment (VALISONE) 0.1 % [id number] Yes APPLY TO AFFECTED AREA TWICE A Junious Dresser, MD Taking Active   diclofenac Sodium (VOLTAREN) 1 % GEL [id number] Yes APPLY 2 G TOPICALLY 4 (FOUR) TIMES DAILY FOR 90 DOSES. Evlyn Kanner, MD Taking Active   hydrOXYzine (ATARAX/VISTARIL) 25 MG tablet [id number] Yes TAKE 1 TABLET BY MOUTH THREE TIMES A DAY AS NEEDED Eliezer Bottom, MD Taking Active   Incontinence Supply Disposable (DEPEND EASY FIT UNDERGARMENTS) MISC [id number] Yes 1 Units by Does not apply route 3 (three) times daily. Camelia Phenes, DO Taking Active Self        Discontinued 04/09/13 1312 (Reorder)   polyethylene glycol (MIRALAX / GLYCOLAX) 17 g packet [id number] No Take 17 g by mouth daily.  Patient not taking: No sig reported   Darlin Drop, DO Not Taking Consider Medication Status and Discontinue   pravastatin (PRAVACHOL) 40 MG tablet [id number] Yes Take 1 tablet (40 mg total) by mouth every evening. Evlyn Kanner, MD  Taking Active   sertraline (ZOLOFT) 50 MG tablet [id number] Yes TAKE 3 TABLETS BY MOUTH EVERY DAY Evlyn Kanner, MD Taking Active   Tiotropium Bromide-Olodaterol (STIOLTO RESPIMAT) 2.5-2.5 MCG/ACT AERS [id number] Yes Inhale into the lungs. [provider] Taking Active           Patient Active Problem List   Diagnosis Date Noted  . Hypercholesterolemia with hypertriglyceridemia 12/31/2019  . Bilateral thumb pain 08/01/2019  . Musculoskeletal pain of right upper extremity 12/10/2018  . Hypokalemia 12/10/2018  . Sepsis (HCC) 11/21/2018  . Fever and chills   . Stress incontinence 04/01/2018  . COPD (chronic obstructive pulmonary disease) (HCC) 03/05/2018  . Mild dementia (HCC) 11/15/2017  . Constipation 11/15/2017  . Encounter for screening mammogram for breast cancer 06/03/2016  . Lumbar radiculopathy, chronic 06/03/2016  . Generalized anxiety disorder 06/03/2016  . Healthcare maintenance 06/03/2016  . GERD (gastroesophageal reflux disease) 12/13/2012  . Depression  12/13/2012  . Dysphagia 04/18/2012  . Hypertension 08/15/2011  . Rash and nonspecific skin eruption 08/15/2011    Conditions to be addressed/monitored: Pulmonary Disease  Care Plan : Medication Management  Updates made by Zettie Pho, RPH since 05/11/2020 12:00 AM    Problem: Health Promotion or Disease Self-Management (General Plan of Care)     Goal: Self-Management Plan Developed   Note:   Current Barriers:  . Unable to achieve control of COPD  o Difficult to do errands . [facility] o [facility] o Because of this she has to walk everywhere and her COPD prevents this  Pharmacist Clinical Goal(s):  Marland Kitchen Over the next 30 days, patient will contact provider office for questions/concerns as evidenced notation of same in electronic health record through collaboration with PharmD and provider.  .   Interventions: . Inter-disciplinary care team collaboration (see  longitudinal plan of care) . Comprehensive medication review performed; medication list updated in electronic medical record  @RXCPCOPD @  Patient Goals/Self-Care Activities . Over the next 30 days, patient will:  - collaborate with provider on medication access solutions  Follow Up Plan: The care management team will reach out to the patient again over the next 30 days.     Task: Mutually Develop and Achievement of Patient Goals   Note:   Care Management Activities:    - verbalization of feelings encouraged    Notes:       Medication Assistance: Wants trial of Trelegy but insurance doesn't cover it  Follow up: Agree/   Plan: The care management team will reach out to the patient again over the next 30 days.   Malen Gauze, Pharm.D., Managed Medicaid Pharmacist - [phone]

## 2020-06-30 NOTE — Patient Instructions (Signed)
Visit Information  Jody Mcclure was given information about Medicaid Managed Care team care coordination services as a part of their [facility] Medicaid benefit. Jody Mcclure verbally consented to engagement with the [facility] Managed Care team.   For questions related to your [facility] health plan, please call: [phone]  If you would like to schedule transportation through your [facility] plan, please call the following number at least 2 days in advance of your appointment: [phone]   Call the [facility] Crisis Line at [phone], at any time, 24 hours a day, 7 days a week. If you are in danger or need immediate medical attention call 911.  Jody Mcclure - following are the goals we discussed in your visit today:  Goals Addressed   None     Please see education materials related to Cholesterol provided as print materials.   Patient verbalizes understanding of instructions provided today.   The Managed Medicaid care management team will reach out to the patient again over the next 30 days.   Artelia Laroche, Pharm.D., Managed Medicaid Pharmacist [phone]   Following is a copy of your plan of care:  Patient Care Plan: COPD (Adult)    Problem Identified: Disease Progression (COPD)     Long-Range Goal: Disease Progression Minimized or Managed   Start Date: 04/29/2020  Expected End Date: 08/28/2020  This Visit's Progress: On track  Recent Progress: On track  Priority: High  Note:   Current Barriers:  . Chronic Disease Management support and education needs related to COPD. Jody Mcclure manages her COPD with a twice daily inhaler and nebulizer treatment as needed, light walks daily and getting adequate rest. She reports using the nebulizer about once per week. She is having difficulty eating a healthy diet due to needing dentures.-Update-Jody Mcclure is experiencing cough, congestion and hoarseness after sleeping with her window open. She denies fever. She does not  have any medication to treat the symptoms and has not contacted her physician. She had to reschedule her dental appointment to April 5 after missing an appointment in March due to arranged transportation not picking her up.  . Corporate treasurer.  . Transportation barriers  Nurse Case Manager Clinical Goal(s):  Marland Kitchen Over the next 60 days, patient will verbalize understanding of plan for COPD . Over the next 60 days, patient will work with [phone]) to address needs related to oral care/denture procurement-Appointment April 5 . Over the next 60 days, the patient will demonstrate ongoing self health care management ability as evidenced by taking medications as prescribed, participating in light exercise as tolerated, avoiding triggers such as smoke and extreme temperature changes  Interventions:  . Inter-disciplinary care team collaboration (see longitudinal plan of care) . Evaluation of current treatment plan related to COPD and patient's adherence to plan as established by provider. . Advised patient to utilize transportation provided by [facility] for medical appointments, [facility] [phone]) . Reviewed medications with patient and discussed patient needing medication for congestion and cough . Collaborated with PCP regarding new symptoms . Discussed plans with patient for ongoing care management follow up and provided patient with direct contact information for care management team . Advised patient, providing education and rationale, to weigh daily and record, calling PCP for weight gain of 3lbs overnight or 5 pounds in a week.  Wendall Stade with pharmacy regarding medication delivery-[facility] to [facility]  Patient Goals/Self-Care Activities Over the next 60 days, patient will: -Schedule an appointment and attend for denture evaluation at  [facility] [phone]) -appointment 07/14/2020 - change to whole grain breads, cereal, pasta - drink 6 to 8 glasses of water  each day - fill half of plate with vegetables - limit fast food meals to no more than 1 per week  - Call [facility] transportation provided by [facility] [phone]), call one week before your scheduled appointment - call to cancel if needed - keep a calendar with prescription refill dates - keep a calendar with appointment dates  - contact your PCP with questions or concerns - avoid second hand smoke - identify and remove indoor air pollutants - limit outdoor activity during cold weather       Follow Up Plan: Telephone follow up appointment with Managed Medicaid care management team member scheduled for:08/28/20 @ 9am       Problem Identified: Symptom Exacerbation (COPD)     Patient Care Plan: Medication Management    Problem Identified: Health Promotion or Disease Self-Management (General Plan of Care)     Goal: Self-Management Plan Developed   Note:   Current Barriers:  . Unable to achieve control of COPD  o Difficult to do errands . Lack of transportation o [facility] - UPDATE MARCH 2022: [facility]. Would like to use [facility] for free o Because of this she has to walk everywhere and her COPD prevents this  Pharmacist Clinical Goal(s):  Marland Kitchen Over the next 30 days, patient will contact provider office for questions/concerns as evidenced notation of same in electronic health record through collaboration with PharmD and provider.  .   Interventions: . Inter-disciplinary care team collaboration (see longitudinal plan of care) . Comprehensive medication review performed; medication list updated in electronic medical record  @RXCPCOPD @  Patient Goals/Self-Care Activities . Over the next 30 days, patient will:  - collaborate with provider on medication access solutions  Follow Up Plan: The care management team will reach out to the patient again over the next 30 days.     Task: Mutually Develop and Achievement of Patient  Goals   Note:   Care Management Activities:    - verbalization of feelings encouraged    Notes:

## 2020-07-06 ENCOUNTER — Ambulatory Visit (INDEPENDENT_AMBULATORY_CARE_PROVIDER_SITE_OTHER): Payer: Medicaid Other | Admitting: Student

## 2020-07-06 ENCOUNTER — Other Ambulatory Visit: Payer: Self-pay

## 2020-07-06 ENCOUNTER — Encounter: Payer: Self-pay | Admitting: Student

## 2020-07-06 DIAGNOSIS — R635 Abnormal weight gain: Secondary | ICD-10-CM | POA: Diagnosis not present

## 2020-07-06 DIAGNOSIS — R058 Other specified cough: Secondary | ICD-10-CM

## 2020-07-06 NOTE — Patient Instructions (Addendum)
Jody Mcclure,   It was a pleasure seeing you today!  Today we discussed your recent cough and weight gain. Likely your cough is related to a recent viral infection. Your lungs sound clear though and likely this has passed. Your weight gain appears to be due to acute grief reaction. If you need any further counseling or assistance in processing these feelings, please reach out to our clinic.  We look forward to seeing you next time. Please call our clinic at [phone] if you have any questions or concerns. The best time to call is Monday-Friday from 9am-4pm, but there is someone available 24/7 at the same number. If you need medication refills, please notify your pharmacy one week in advance and they will send [place] a request.  Thank you for letting us take part in your care. Wishing you the best!  Thank you, Dr. Evlyn Kanner, MD

## 2020-07-06 NOTE — Progress Notes (Signed)
   CC: cough, weight gain  HPI:  [name] is a 65 y.o. with medical history as listed below presenting to [facility] to discuss recent cough and weight gain.  Please see problem-based list for further details, assessments, and plans.  Past Medical History:  Diagnosis Date  . Anxiety   . Depression   . Hyperlipidemia   . Hypertension    Review of Systems:  As per HPI  Physical Exam:  Vitals:   07/06/20 1425  BP: 122/68  Pulse: 85  Temp: 98.1 F (36.7 C)  TempSrc: Oral  SpO2: 96%  Weight: 151 lb 9.6 oz (68.8 kg)  Height: 5' (1.524 m)   General: Appears anxious, no acute distress CV: Regular rate, rhythm. No m/r/g Pulm: Normal work of breathing. Clear to auscultation bilaterally. MSK: Normal bulk, tone. No pitting edema bilaterally.  Assessment & Plan:   See Encounters Tab for problem based charting.  Patient discussed with Dr. Mikey Bussing

## 2020-07-07 DIAGNOSIS — R635 Abnormal weight gain: Secondary | ICD-10-CM | POA: Insufficient documentation

## 2020-07-07 DIAGNOSIS — R058 Other specified cough: Secondary | ICD-10-CM | POA: Insufficient documentation

## 2020-07-07 NOTE — Assessment & Plan Note (Addendum)
Patient presenting to [facility] today with acute on chronic cough. Patient reports over the last week and half she has experienced cough with green sputum. This is associated with sore throat, rhinorrhea.  Reports symptoms started after leaving her bedroom window open at night and have significantly improved since then. States that she feels "much better" today. Denies fevers, dyspnea, chest pain, chills, nausea, vomiting, diarrhea, abdominal pain. She has been vaccinated for COVID-19 x3 and denies any sick contacts.   A/P: Discussed testing for COVID-19, but due to patient preference and timeline of symptoms, will hold off any testing. Lungs clear on auscultation. Symptoms most likely due to allergic rhinitis. Plan to treat using conservative management, including over the counter allergy medications and nasal irrigation.  - Nasal irrigation, Flonase - RTC if symptoms fail to improve or worsen

## 2020-07-07 NOTE — Assessment & Plan Note (Signed)
Patient also presenting to [facility] today to discuss recent weight gain. She reports she feels like she is constantly eating, especially over the last month or so. Patient's roommate is also in the room and mentions that she "is always in the kitchen getting something." Jody Mcclure also reports that she has had two recent family deaths over the last couple of months, including her only brother two days ago. She mentions that she is coping okay, and that her roommate is helping her through it as well.  A/P: [name] has had 11-lb weight gain since last visit (151lb today). Counseled patient healthy coping mechanisms, including exercise. Encouraged patient to return to clinic if symptoms worsen or if she develops suicidal or homicidal ideation. - Will continue to monitor at future visits.

## 2020-07-08 NOTE — Progress Notes (Signed)
Internal Medicine Clinic Attending ? ?Case discussed with Dr. Braswell  At the time of the visit.  We reviewed the resident?s history and exam and pertinent patient test results.  I agree with the assessment, diagnosis, and plan of care documented in the resident?s note.  ?

## 2020-07-10 DIAGNOSIS — Z419 Encounter for procedure for purposes other than remedying health state, unspecified: Secondary | ICD-10-CM | POA: Diagnosis not present

## 2020-07-10 DIAGNOSIS — R32 Unspecified urinary incontinence: Secondary | ICD-10-CM | POA: Diagnosis not present

## 2020-07-14 DIAGNOSIS — Z7689 Persons encountering health services in other specified circumstances: Secondary | ICD-10-CM | POA: Diagnosis not present

## 2020-07-17 ENCOUNTER — Other Ambulatory Visit: Payer: Self-pay

## 2020-07-17 NOTE — Patient Outreach (Signed)
Medicaid Managed Care Social Work Note  07/17/2020 Name:  MEGHEN AKOPYAN MRN:  [id number] DOB:  01-08-1956  DAVONNA ERTL is an 65 y.o. year old female who is a primary patient of Evlyn Kanner, MD.  The [facility] Managed Care Coordination team was consulted for assistance with:  Food Insecurity  Ms. Dittmer was given information about Medicaid Managed CareCoordination services today. [name] agreed to services and verbal consent obtained.  Engaged with patient  for by telephone forfollow up visit in response to referral for case management and/or care coordination services.   Assessments/Interventions:  Review of past medical history, allergies, medications, health status, including review of consultants reports, laboratory and other test data, was performed as part of comprehensive evaluation and provision of chronic care management services.  SDOH: (Social Determinant of Health) assessments and interventions performed:   BSW contacted patient to follow up. Patient stated she is doing well but still has a cough. Patient stated she is doing well with food, she has not gone to the food pantry yet, but has food. BSW asked if patient needed any other resources, patient stated she is okay for now.   Advanced Directives Status:  Not addressed in this encounter.  Care Plan                 Allergies  Allergen Reactions  . Codeine Nausea And Vomiting  . Sulfa Antibiotics Rash    Medications Reviewed Today    Reviewed by Zettie Pho, [facility] (Pharmacist) on 06/30/20 at 1450  Med List Status: <None>  Medication Order Taking? Sig Documenting Provider Last Dose Status Informant  albuterol (PROVENTIL) (2.5 MG/3ML) 0.083% nebulizer solution [id number] Yes TAKE 3 MLS (2.5 MG TOTAL) BY NEBULIZATION 2 (TWO) TIMES DAILY AS NEEDED FOR WHEEZING OR SHORTNESS OF BREATH. Evlyn Kanner, MD Taking Active   amLODipine (NORVASC) 5 MG tablet [id number] Yes TAKE 1 TABLET BY MOUTH EVERY DAY Katsadouros,  Vasilios, MD Taking Active   betamethasone valerate ointment (VALISONE) 0.1 % [id number] Yes APPLY TO AFFECTED AREA TWICE A Junious Dresser, MD Taking Active   diclofenac Sodium (VOLTAREN) 1 % GEL [id number] Yes APPLY 2 G TOPICALLY 4 (FOUR) TIMES DAILY FOR 90 DOSES. Evlyn Kanner, MD Taking Active   hydrOXYzine (ATARAX/VISTARIL) 25 MG tablet [id number] Yes TAKE 1 TABLET BY MOUTH THREE TIMES A DAY AS NEEDED Eliezer Bottom, MD Taking Active   Incontinence Supply Disposable (DEPEND EASY FIT UNDERGARMENTS) MISC [id number] Yes 1 Units by Does not apply route 3 (three) times daily. Camelia Phenes, DO Taking Active Self        Discontinued 04/09/13 1312 (Reorder)   polyethylene glycol (MIRALAX / GLYCOLAX) 17 g packet [id number] No Take 17 g by mouth daily.  Patient not taking: No sig reported   Darlin Drop, DO Not Taking Consider Medication Status and Discontinue   pravastatin (PRAVACHOL) 40 MG tablet [id number] Yes Take 1 tablet (40 mg total) by mouth every evening. Evlyn Kanner, MD Taking Active   sertraline (ZOLOFT) 50 MG tablet [id number] Yes TAKE 3 TABLETS BY MOUTH EVERY DAY Evlyn Kanner, MD Taking Active   Tiotropium Bromide-Olodaterol (STIOLTO RESPIMAT) 2.5-2.5 MCG/ACT AERS [id number] Yes Inhale into the lungs. [provider] Taking Active           Patient Active Problem List   Diagnosis Date Noted  . Cough with sputum 07/07/2020  . Weight gain 07/07/2020  . Hypercholesterolemia with hypertriglyceridemia 12/31/2019  . Bilateral thumb pain 08/01/2019  . Musculoskeletal pain  of right upper extremity 12/10/2018  . Hypokalemia 12/10/2018  . Sepsis (HCC) 11/21/2018  . Fever and chills   . Stress incontinence 04/01/2018  . COPD (chronic obstructive pulmonary disease) (HCC) 03/05/2018  . Mild dementia (HCC) 11/15/2017  . Constipation 11/15/2017  . Encounter for screening mammogram for breast cancer 06/03/2016  . Lumbar radiculopathy, chronic 06/03/2016  .  Generalized anxiety disorder 06/03/2016  . Healthcare maintenance 06/03/2016  . GERD (gastroesophageal reflux disease) 12/13/2012  . Depression 12/13/2012  . Dysphagia 04/18/2012  . Hypertension 08/15/2011  . Rash and nonspecific skin eruption 08/15/2011    Conditions to be addressed/monitored per PCP order:  food   There are no care plans that you recently modified to display for this patient.   Follow up:  Patient agrees to Care Plan and Follow-up.  Plan: The Managed Medicaid care management team will reach out to the patient again over the next 30 days.  Date/time of next scheduled Social Work care management/care coordination outreach:  08/17/20  Gus Puma, BSW, MHA [facility]  [place]  High Risk Managed Medicaid Team

## 2020-07-17 NOTE — Patient Instructions (Signed)
Visit Information  Jody Mcclure was given information about Medicaid Managed Care team care coordination services as a part of their [facility] Medicaid benefit. Jody Mcclure verbally consented to engagement with the [facility] Managed Care team.   For questions related to your [facility] health plan, please call: [phone]  If you would like to schedule transportation through your [facility] plan, please call the following number at least 2 days in advance of your appointment: [phone]   Call the [facility] Crisis Line at [phone], at any time, 24 hours a day, 7 days a week. If you are in danger or need immediate medical attention call 911.  Jody Mcclure - following are the goals we discussed in your visit today:  Goals Addressed   None     Social Worker will follow up in 30 days.   Jody Mcclure, BSW, [facility]  [place]  High Risk Managed Medicaid Team    Following is a copy of your plan of care:

## 2020-07-19 ENCOUNTER — Other Ambulatory Visit: Payer: Self-pay | Admitting: Internal Medicine

## 2020-07-19 DIAGNOSIS — F411 Generalized anxiety disorder: Secondary | ICD-10-CM

## 2020-07-28 ENCOUNTER — Ambulatory Visit (INDEPENDENT_AMBULATORY_CARE_PROVIDER_SITE_OTHER): Payer: Medicaid Other | Admitting: Internal Medicine

## 2020-07-28 ENCOUNTER — Telehealth: Payer: Self-pay | Admitting: *Deleted

## 2020-07-28 ENCOUNTER — Encounter: Payer: Self-pay | Admitting: Internal Medicine

## 2020-07-28 DIAGNOSIS — N393 Stress incontinence (female) (male): Secondary | ICD-10-CM | POA: Diagnosis not present

## 2020-07-28 NOTE — Telephone Encounter (Signed)
SPOKE WITH PATIENT FOR HER TELEHEALTH VISIT.  PATIENT HAS A COUGH AND STATES THE COUGH IS CAUSING RIGHT SIDE OF THROAT TO HURT. WILL GIVE MESSAGE TO TRIAGE .T

## 2020-07-28 NOTE — Telephone Encounter (Signed)
Agree with tele-health thank you

## 2020-07-28 NOTE — Progress Notes (Signed)
  [facility] Internal Medicine Residency Telephone Encounter Continuity Care Appointment  HPI:   This telephone encounter was created for Ms. KATHARYN SCHAUER on 07/28/2020 for the following purpose/cc stress incontinent supllies.   Past Medical History:  Past Medical History:  Diagnosis Date  . Anxiety   . Depression   . Hyperlipidemia   . Hypertension       ROS:  Review of Systems  Constitutional: Negative for chills and fever.  Genitourinary: Negative for dysuria, flank pain and urgency.       Assessment / Plan / Recommendations:   Please see A&P under problem oriented charting for assessment of the patient's acute and chronic medical conditions.   As always, pt is advised that if symptoms worsen or new symptoms arise, they should go to an urgent care facility or to to ER for further evaluation.   Consent and Medical Decision Making:   Patient discussed with Dr. Oswaldo Done  This is a telephone encounter between [name] and Milus Banister on 07/28/2020 for stress incontinence. The visit was conducted with the patient located at home and Milus Banister at [facility]. The patient's identity was confirmed using their DOB and current address. The patient has consented to being evaluated through a telephone encounter and understands the associated risks (an examination cannot be done and the patient may need to come in for an appointment) / benefits (allows the patient to remain at home, decreasing exposure to coronavirus). I personally spent 10 minutes on medical discussion.

## 2020-07-28 NOTE — Telephone Encounter (Signed)
Call to patient to schedule tele appt to discuss need for incontinence. Man answered and states patient not home but to call back at 1500. Placed on Yellow Team at 1515.

## 2020-07-28 NOTE — Telephone Encounter (Signed)
Received faxed request for order, CMN, and OV notes addressing need for urinary incontinence supplies from Deon B at [facility]. This has not been addressed since 08/01/2019. Will route to [employer] for assistance scheduling patient to discuss need. This may be done by telehealth.

## 2020-07-28 NOTE — Assessment & Plan Note (Signed)
Hx of stress incontinence since having her second child .Reports she leaks urine with walking, laughing , or sneezing. Denies frequent urge to urinate or recent infectious symptoms. She has been given pelvic floor exercises in the past, but continues to have symptoms. Does not want a referral to urology at this time and says she has managed this with adult diapers pretty well.  - Adult medium Pull-on/disposable underwear

## 2020-07-29 NOTE — Progress Notes (Signed)
Internal Medicine Clinic Attending  Case discussed with Dr. Steen  At the time of the visit.  We reviewed the resident's history and exam and pertinent patient test results.  I agree with the assessment, diagnosis, and plan of care documented in the resident's note.  

## 2020-08-09 DIAGNOSIS — Z419 Encounter for procedure for purposes other than remedying health state, unspecified: Secondary | ICD-10-CM | POA: Diagnosis not present

## 2020-08-11 NOTE — Telephone Encounter (Signed)
Order, CMN, and OV notes from 07/28/2020 addressing need for urinary incontinence supplies faxed to Deon B at [facility]. Fax confirmation receipt received.

## 2020-08-17 ENCOUNTER — Ambulatory Visit (INDEPENDENT_AMBULATORY_CARE_PROVIDER_SITE_OTHER): Payer: Medicaid Other | Admitting: Internal Medicine

## 2020-08-17 ENCOUNTER — Ambulatory Visit: Payer: Self-pay

## 2020-08-17 DIAGNOSIS — J441 Chronic obstructive pulmonary disease with (acute) exacerbation: Secondary | ICD-10-CM | POA: Diagnosis not present

## 2020-08-17 MED ORDER — AZITHROMYCIN 250 MG PO TABS: ORAL_TABLET | ORAL | 0 refills | Status: AC

## 2020-08-17 MED ORDER — PREDNISONE 20 MG PO TABS: 40.0000 mg | ORAL_TABLET | Freq: Every day | ORAL | 0 refills | Status: AC

## 2020-08-17 NOTE — Progress Notes (Signed)
   CC: Cough  HPI:  Jody Mcclure is a 65 y.o. with a PMHx as listed below who presents to the clinic for cough.   Please see the Encounters tab for problem-based Assessment & Plan regarding status of patient's acute and chronic conditions.  Past Medical History:  Diagnosis Date  . Anxiety   . Depression   . Hyperlipidemia   . Hypertension    Review of Systems: Review of Systems  Constitutional: Negative for chills and fever.  HENT: Negative for congestion and sore throat.   Respiratory: Positive for cough, sputum production, shortness of breath and wheezing.   Cardiovascular: Negative for chest pain and palpitations.  Gastrointestinal: Negative for abdominal pain, diarrhea, nausea and vomiting.   Physical Exam:  Vitals:   08/17/20 1419  BP: (!) 143/88  Pulse: 97  Temp: 98.3 F (36.8 C)  TempSrc: Oral  SpO2: 97%  Weight: 144 lb 9.6 oz (65.6 kg)   Physical Exam Vitals and nursing note reviewed.  Constitutional:      General: She is not in acute distress.    Appearance: She is normal weight.  Cardiovascular:     Rate and Rhythm: Normal rate and regular rhythm.     Heart sounds: No murmur heard.   Pulmonary:     Effort: Pulmonary effort is normal. No tachypnea, accessory muscle usage or respiratory distress.     Breath sounds: Wheezing (Bibasilar) present. No decreased breath sounds, rhonchi or rales.  Skin:    General: Skin is warm and dry.  Neurological:     General: No focal deficit present.     Mental Status: She is alert and oriented to person, place, and time. Mental status is at baseline.  Psychiatric:        Mood and Affect: Mood normal.        Behavior: Behavior normal.    Assessment & Plan:   See Encounters Tab for problem based charting.  Patient discussed with Dr. Antony Contras

## 2020-08-17 NOTE — Patient Instructions (Addendum)
It was nice seeing you today! Thank you for choosing [facility] for your Primary Care.    Today we talked about:   1. COPD Exacerbation: I have sent in a prescription for steroid called Prednisone and an antibiotic called Azithromycin. Please take both for 5 days total. Continue to take Mucinex twice per day.

## 2020-08-17 NOTE — Assessment & Plan Note (Signed)
Patient presents with 2-week history of productive cough with green phlegm.  She states she is having increased shortness of breath, particularly during coughing episodes.  She began taking Mucinex approximately 3 to 4 days ago and her cough has slightly improved since then.  She notes that in the past she has been treated with antibiotics and prednisone given history of pneumonia before.  She denies any fevers, chills, chest pain, nausea, vomiting, diarrhea.  Assessment/plan: Given increased cough with production and shortness of breath, we will go ahead and treat this as a COPD exacerbation.  Physical exam is reassuring and no red flag symptoms to indicate need for chest x-ray or further work-up.  Instructed to return to clinic if symptoms worsen or do not improve.  - Prednisone 40 mg x 5 days - Azithromycin 500 mg x 1 day, followed by 250 mg x 4 days - Continue Mucinex

## 2020-08-18 ENCOUNTER — Other Ambulatory Visit: Payer: Self-pay

## 2020-08-18 IMAGING — CR DG CHEST 2V
2 series · 2 of 2 positions shown · non-contrast
Comparison: Portable chest x-ray July 06, 2017

CLINICAL DATA: Several days of nonproductive cough and exertional
shortness of breath. Chest tightness for the past week associated
with short throat. History of hypertension, gastroesophageal reflux,
former smoker.

EXAM:
CHEST - 2 VIEW

[w chest pa]
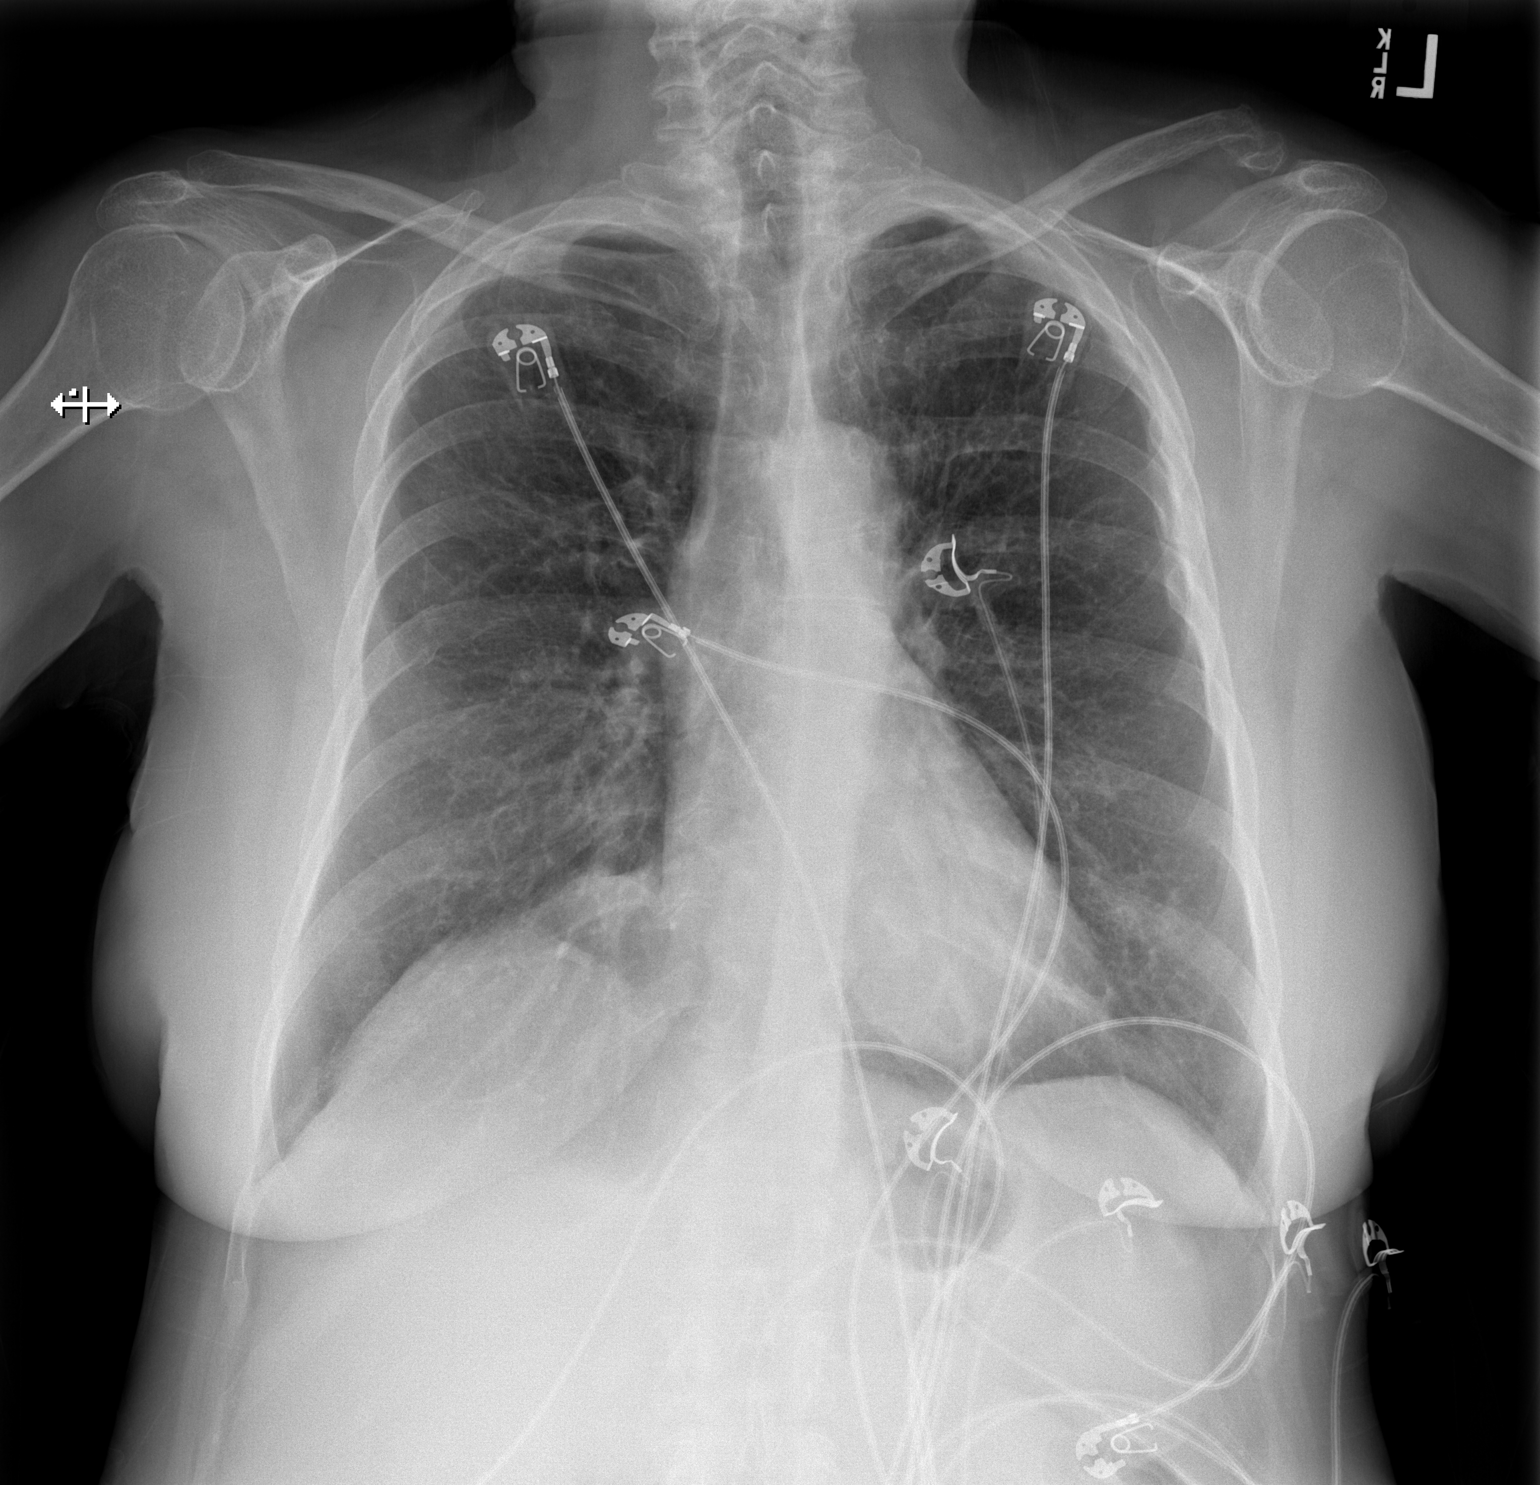

[w chest lat]
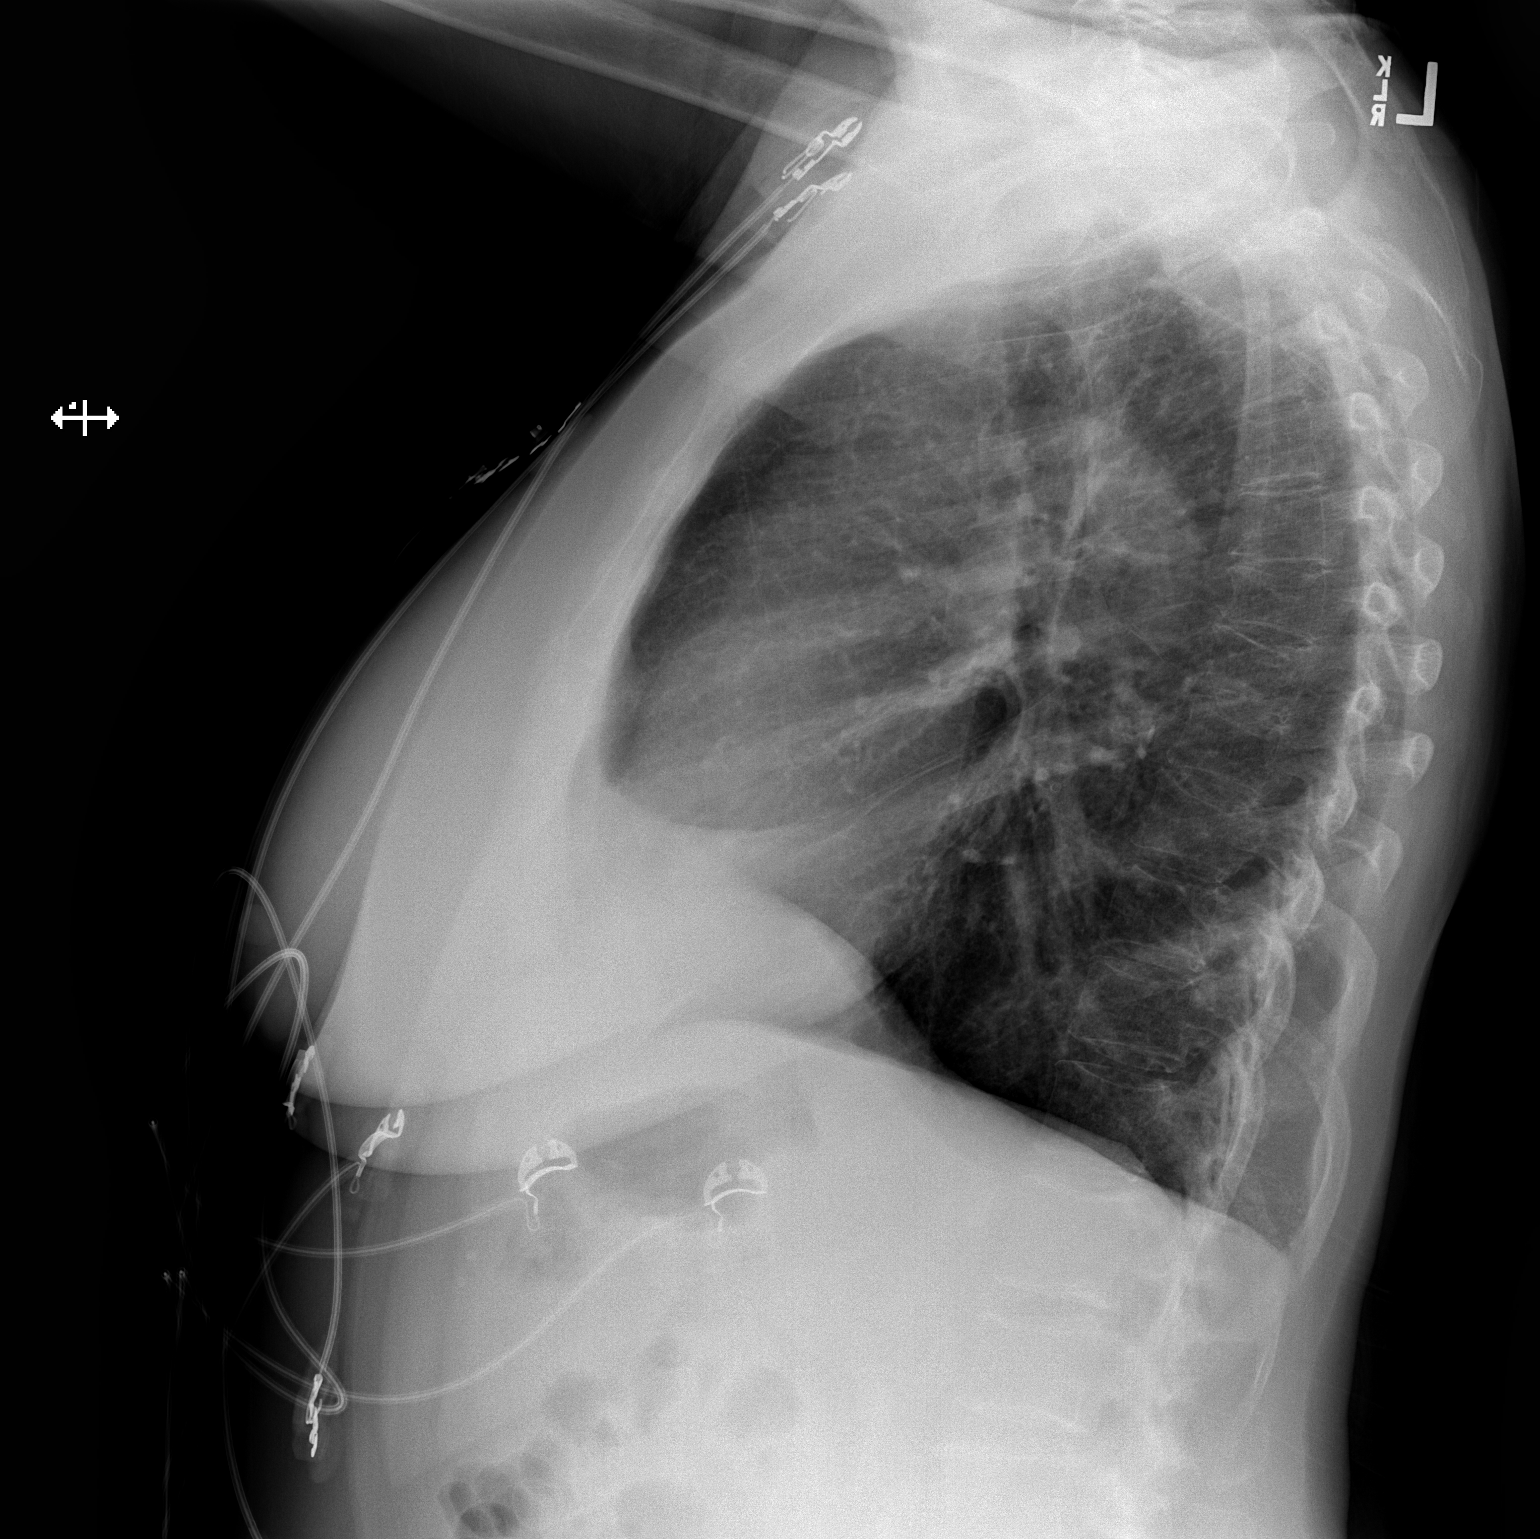

[2 of 2 positions shown; findings below may reference images not displayed]

FINDINGS: The lungs are adequately inflated. There is subtle increased density
that projects over the lower thoracic spine likely on the right in
the lower lobe. The interstitial markings of both lungs are coarse.
The heart and pulmonary vascularity are normal. The mediastinum is
normal in width. There is calcification in the wall of the aortic
arch. The bony thorax exhibits no acute abnormality.
IMPRESSION: Probable pneumonia in the posteromedial aspect of the right lower
lobe. Underlying COPD. Followup PA and lateral chest X-ray is
recommended in 3-4 weeks following trial of antibiotic therapy to
ensure resolution and exclude underlying malignancy.

Thoracic aortic atherosclerosis.

## 2020-08-18 NOTE — Progress Notes (Signed)
Internal Medicine Clinic Attending  Case discussed with Dr. Basaraba  At the time of the visit.  We reviewed the resident's history and exam and pertinent patient test results.  I agree with the assessment, diagnosis, and plan of care documented in the resident's note.  

## 2020-08-18 NOTE — Patient Outreach (Signed)
Medicaid Managed Care Social Work Note  08/18/2020 Name:  Jody Mcclure MRN:  [id number] DOB:  June 26, 1955  Jody Mcclure is an 65 y.o. year old female who is a primary patient of Evlyn Kanner, MD.  The [facility] Managed Care Coordination team was consulted for assistance with:  Food Insecurity  Jody Mcclure was given information about Medicaid Managed CareCoordination services today. [name] agreed to services and verbal consent obtained.  Engaged with patient  for by telephone forfollow up visit in response to referral for case management and/or care coordination services.   Assessments/Interventions:  Review of past medical history, allergies, medications, health status, including review of consultants reports, laboratory and other test data, was performed as part of comprehensive evaluation and provision of chronic care management services.  SDOH: (Social Determinant of Health) assessments and interventions performed:  BSW contacted patient to follow up on food and any other resources needed. Patient stated she is doing great and no other resources are needed at this time.    Advanced Directives Status:  Not addressed in this encounter.  Care Plan                 Allergies  Allergen Reactions  . Codeine Nausea And Vomiting  . Sulfa Antibiotics Rash    Medications Reviewed Today    Reviewed by Verdene Lennert, MD (Resident) on 08/17/20 at 1446  Med List Status: <None>  Medication Order Taking? Sig Documenting Provider Last Dose Status Informant  albuterol (PROVENTIL) (2.5 MG/3ML) 0.083% nebulizer solution [id number] No TAKE 3 MLS (2.5 MG TOTAL) BY NEBULIZATION 2 (TWO) TIMES DAILY AS NEEDED FOR WHEEZING OR SHORTNESS OF BREATH. Evlyn Kanner, MD Taking Active   amLODipine (NORVASC) 5 MG tablet [id number] No TAKE 1 TABLET BY MOUTH EVERY DAY Katsadouros, Vasilios, MD Taking Active   azithromycin (ZITHROMAX) 250 MG tablet [id number] Yes Take 2 tablets (500 mg) on  Day 1,  followed  by 1 tablet (250 mg) once daily on Days 2 through 5. Verdene Lennert, MD  Active   betamethasone valerate ointment (VALISONE) 0.1 % [id number] No APPLY TO AFFECTED AREA TWICE A Junious Dresser, MD Taking Active   hydrOXYzine (ATARAX/VISTARIL) 25 MG tablet [id number]  TAKE 1 TABLET BY MOUTH THREE TIMES A DAY AS NEEDED Glenford Bayley, MD  Active   Incontinence Supply Disposable (DEPEND EASY FIT UNDERGARMENTS) MISC [id number] No 1 Units by Does not apply route 3 (three) times daily. Camelia Phenes, DO Taking Active Self        Discontinued 04/09/13 1312 (Reorder)   polyethylene glycol (MIRALAX / GLYCOLAX) 17 g packet [id number] No Take 17 g by mouth daily.  Patient not taking: No sig reported   Darlin Drop, DO Not Taking Active   pravastatin (PRAVACHOL) 40 MG tablet [id number] No Take 1 tablet (40 mg total) by mouth every evening. Evlyn Kanner, MD Taking Active   predniSONE (DELTASONE) 20 MG tablet [id number] Yes Take 2 tablets (40 mg total) by mouth daily with breakfast for 5 days. Verdene Lennert, MD  Active   sertraline (ZOLOFT) 50 MG tablet [id number] No TAKE 3 TABLETS BY MOUTH EVERY DAY Evlyn Kanner, MD Taking Active   Tiotropium Bromide-Olodaterol (STIOLTO RESPIMAT) 2.5-2.5 MCG/ACT AERS [id number] No Inhale into the lungs. [provider] Taking Active           Patient Active Problem List   Diagnosis Date Noted  . COPD exacerbation (HCC) 08/17/2020  . Cough with sputum 07/07/2020  . Weight gain 07/07/2020  .  Hypercholesterolemia with hypertriglyceridemia 12/31/2019  . Bilateral thumb pain 08/01/2019  . Musculoskeletal pain of right upper extremity 12/10/2018  . Hypokalemia 12/10/2018  . Sepsis (HCC) 11/21/2018  . Fever and chills   . Stress incontinence 04/01/2018  . COPD (chronic obstructive pulmonary disease) (HCC) 03/05/2018  . Mild dementia (HCC) 11/15/2017  . Constipation 11/15/2017  . Encounter for screening mammogram for breast cancer 06/03/2016   . Lumbar radiculopathy, chronic 06/03/2016  . Generalized anxiety disorder 06/03/2016  . Healthcare maintenance 06/03/2016  . GERD (gastroesophageal reflux disease) 12/13/2012  . Depression 12/13/2012  . Dysphagia 04/18/2012  . Hypertension 08/15/2011  . Rash and nonspecific skin eruption 08/15/2011    Conditions to be addressed/monitored per PCP order:  Food resources  There are no care plans that you recently modified to display for this patient.   Follow up:  Patient agrees to Care Plan and Follow-up.  Plan: The patient has been provided with contact information for the Managed Medicaid care management team and has been advised to call with any health related questions or concerns.    Gus Puma, BSW, [facility]  [employer] Risk Managed Medicaid Team  [phone]

## 2020-08-18 NOTE — Patient Instructions (Signed)
Visit Information  Jody Mcclure was given information about Medicaid Managed Care team care coordination services as a part of their [facility] Medicaid benefit. Jody Mcclure verbally consented to engagement with the [facility] Managed Care team.   For questions related to your [facility] health plan, please call: [phone]  If you would like to schedule transportation through your [facility] plan, please call the following number at least 2 days in advance of your appointment: [phone].  Call the Behavioral Health Crisis Line at [phone], at any time, 24 hours a day, 7 days a week. If you are in danger or need immediate medical attention call 911.  Jody Mcclure - following are the goals we discussed in your visit today:  Goals Addressed   None     The patient has been provided with contact information for the Managed Medicaid care management team and has been advised to call with any health related questions or concerns.   Gus Puma, BSW, [facility]  [place]  High Risk Managed Medicaid Team  [phone]  Following is a copy of your plan of care:

## 2020-08-26 DIAGNOSIS — Z7689 Persons encountering health services in other specified circumstances: Secondary | ICD-10-CM | POA: Diagnosis not present

## 2020-08-28 ENCOUNTER — Other Ambulatory Visit: Payer: Self-pay | Admitting: *Deleted

## 2020-08-28 NOTE — Patient Outreach (Signed)
Care Coordination  08/28/2020  TALITHA DICARLO [birth year] [id number]    Medicaid Managed Care   Unsuccessful Outreach Note  08/28/2020 Name: CALENE PARADISO MRN: [id number] DOB: June 09, 1955  Referred by: Evlyn Kanner, MD Reason for referral : High Risk Managed Medicaid (Unsuccessful RNCM follow up outreach)   A second unsuccessful telephone outreach was attempted today. The patient was referred to the case management team for assistance with care management and care coordination.   Follow Up Plan: A HIPAA compliant phone message was left for the patient providing contact information and requesting a return call.  The care management team will reach out to the patient again over the next 7-14 days.   [name] RN, BSN [place]  [facility]

## 2020-08-28 NOTE — Patient Instructions (Signed)
Visit Information  Jody Mcclure  - as a part of your Medicaid benefit, you are eligible for care management and care coordination services at no cost or copay. I was unable to reach you by phone today but would be happy to help you with your health related needs. Please feel free to call me @ [phone].   A member of the Managed Medicaid care management team will reach out to you again over the next 7-14 days.   [name] RN, BSN [place]  [facility]

## 2020-09-08 DIAGNOSIS — Z7689 Persons encountering health services in other specified circumstances: Secondary | ICD-10-CM | POA: Diagnosis not present

## 2020-09-09 DIAGNOSIS — Z419 Encounter for procedure for purposes other than remedying health state, unspecified: Secondary | ICD-10-CM | POA: Diagnosis not present

## 2020-09-09 DIAGNOSIS — R32 Unspecified urinary incontinence: Secondary | ICD-10-CM | POA: Diagnosis not present

## 2020-09-16 ENCOUNTER — Other Ambulatory Visit: Payer: Self-pay | Admitting: *Deleted

## 2020-09-16 ENCOUNTER — Other Ambulatory Visit: Payer: Medicaid Other

## 2020-09-16 ENCOUNTER — Other Ambulatory Visit: Payer: Self-pay | Admitting: Student

## 2020-09-16 DIAGNOSIS — E782 Mixed hyperlipidemia: Secondary | ICD-10-CM

## 2020-09-16 DIAGNOSIS — R0609 Other forms of dyspnea: Secondary | ICD-10-CM

## 2020-09-16 DIAGNOSIS — F411 Generalized anxiety disorder: Secondary | ICD-10-CM

## 2020-09-16 DIAGNOSIS — I10 Essential (primary) hypertension: Secondary | ICD-10-CM

## 2020-09-16 MED ORDER — HYDROXYZINE HCL 25 MG PO TABS: 25.0000 mg | ORAL_TABLET | Freq: Three times a day (TID) | ORAL | 2 refills | Status: DC | PRN

## 2020-09-16 MED ORDER — SERTRALINE HCL 50 MG PO TABS: 3.0000 | ORAL_TABLET | Freq: Every day | ORAL | 0 refills | Status: DC

## 2020-09-16 MED ORDER — ALBUTEROL SULFATE (2.5 MG/3ML) 0.083% IN NEBU: 2.5000 mg | INHALATION_SOLUTION | Freq: Two times a day (BID) | RESPIRATORY_TRACT | 0 refills | Status: DC | PRN

## 2020-09-16 MED ORDER — PRAVASTATIN SODIUM 40 MG PO TABS: 40.0000 mg | ORAL_TABLET | Freq: Every evening | ORAL | 11 refills | Status: DC

## 2020-09-16 MED ORDER — AMLODIPINE BESYLATE 5 MG PO TABS: 1.0000 | ORAL_TABLET | Freq: Every day | ORAL | 1 refills | Status: DC

## 2020-09-16 MED ORDER — BETAMETHASONE VALERATE 0.1 % EX OINT: TOPICAL_OINTMENT | CUTANEOUS | 3 refills | Status: DC

## 2020-09-16 NOTE — Patient Outreach (Signed)
Care Coordination  09/16/2020  Jody Mcclure 05/03/1955 [id number]   Medicaid Managed Care   Unsuccessful Outreach Note  09/16/2020 Name: Jody Mcclure MRN: [id number] DOB: 05-23-1955  Referred by: Evlyn Kanner, MD Reason for referral : High Risk Managed Medicaid (Unsuccessful RNCM follow up outreach 2nd attempt)   A second unsuccessful telephone outreach was attempted today. The patient was referred to the case management team for assistance with care management and care coordination.   Follow Up Plan: A HIPAA compliant phone message was left for the patient providing contact information and requesting a return call.   [name] RN, BSN [place]  [facility]

## 2020-09-16 NOTE — Patient Instructions (Signed)
Visit Information  [name]  - as a part of your Medicaid benefit, you are eligible for care management and care coordination services at no cost or copay. I was unable to reach you by phone today but would be happy to help you with your health related needs. Please feel free to call me @ [phone] or to reschedule call Victorino Dike at [phone].   A member of the Managed Medicaid care management team will reach out to you again over the next 7-14 days.   [name] RN, BSN [place]  [facility]

## 2020-09-16 NOTE — Patient Outreach (Signed)
Due for refills on 09/22/20  Stiolto Aer 2.5-2.5  Diclofenac 1%    Will ask PCP for refills of Pravastatin, Sertraline, Betamethasone, Albuterol (nebulizer), Amlodipine, or Hydroxyzine  F/U 90 days

## 2020-09-17 ENCOUNTER — Other Ambulatory Visit: Payer: Self-pay | Admitting: Student

## 2020-09-17 DIAGNOSIS — F411 Generalized anxiety disorder: Secondary | ICD-10-CM

## 2020-09-23 ENCOUNTER — Ambulatory Visit: Payer: Self-pay

## 2020-10-05 ENCOUNTER — Encounter: Payer: Medicaid Other | Admitting: Student

## 2020-10-09 DIAGNOSIS — Z419 Encounter for procedure for purposes other than remedying health state, unspecified: Secondary | ICD-10-CM | POA: Diagnosis not present

## 2020-10-09 DIAGNOSIS — R32 Unspecified urinary incontinence: Secondary | ICD-10-CM | POA: Diagnosis not present

## 2020-10-12 ENCOUNTER — Other Ambulatory Visit: Payer: Self-pay | Admitting: Internal Medicine

## 2020-10-14 ENCOUNTER — Other Ambulatory Visit: Payer: Medicaid Other

## 2020-10-14 NOTE — Patient Outreach (Signed)
Meds due 10/21/20  Betamethasone Ointment  0.10%  Hydroxyzine   25 mg  Diclofenac Gel   1%  Stiolto Respimat  2.66mcg  Albuterol Sulfate   0.08%    Other meds were filled 90 days last month

## 2020-10-19 ENCOUNTER — Other Ambulatory Visit: Payer: Self-pay | Admitting: Student

## 2020-10-19 ENCOUNTER — Other Ambulatory Visit: Payer: Self-pay

## 2020-10-19 ENCOUNTER — Ambulatory Visit (INDEPENDENT_AMBULATORY_CARE_PROVIDER_SITE_OTHER): Payer: Medicaid Other | Admitting: Student

## 2020-10-19 VITALS — BP 140/65 | HR 83 | Temp 98.0°F | Ht 60.0 in | Wt 146.6 lb

## 2020-10-19 DIAGNOSIS — R053 Chronic cough: Secondary | ICD-10-CM

## 2020-10-19 DIAGNOSIS — R21 Rash and other nonspecific skin eruption: Secondary | ICD-10-CM | POA: Diagnosis not present

## 2020-10-19 DIAGNOSIS — R0600 Dyspnea, unspecified: Secondary | ICD-10-CM

## 2020-10-19 DIAGNOSIS — Z0000 Encounter for general adult medical examination without abnormal findings: Secondary | ICD-10-CM | POA: Diagnosis not present

## 2020-10-19 DIAGNOSIS — R0609 Other forms of dyspnea: Secondary | ICD-10-CM

## 2020-10-19 DIAGNOSIS — S80819A Abrasion, unspecified lower leg, initial encounter: Secondary | ICD-10-CM

## 2020-10-19 MED ORDER — ALBUTEROL SULFATE (2.5 MG/3ML) 0.083% IN NEBU: 2.5000 mg | INHALATION_SOLUTION | Freq: Two times a day (BID) | RESPIRATORY_TRACT | 0 refills | Status: DC | PRN

## 2020-10-19 MED ORDER — PREDNISONE 20 MG PO TABS: 40.0000 mg | ORAL_TABLET | Freq: Every day | ORAL | 0 refills | Status: AC

## 2020-10-19 NOTE — Patient Instructions (Signed)
Ms.Ronalee MAUDEAN HOFFMANN, it was a pleasure seeing you today!  Today we discussed: - Rash: I have referred you to dermatology.   - Cough: The steroids should help with the cough  Referrals ordered today:    Referral Orders  Ambulatory referral to Dermatology    I have ordered the following medication/changed the following medications:   Stop the following medications: There are no discontinued medications.   Start the following medications: Meds ordered this encounter  Medications   predniSONE (DELTASONE) 20 MG tablet    Sig: Take 2 tablets (40 mg total) by mouth daily with breakfast for 5 days.    Dispense:  10 tablet    Refill:  0     Follow-up:  if symptoms do not improve    Please make sure to arrive 15 minutes prior to your next appointment. If you arrive late, you may be asked to reschedule.   We look forward to seeing you next time. Please call our clinic at [phone] if you have any questions or concerns. The best time to call is Monday-Friday from 9am-4pm, but there is someone available 24/7. If after hours or the weekend, call the main hospital number and ask for the Internal Medicine Resident On-Call. If you need medication refills, please notify your pharmacy one week in advance and they will send [place] a request.  Thank you for letting us take part in your care. Wishing you the best!  Thank you, Evlyn Kanner, MD

## 2020-10-19 NOTE — Patient Outreach (Signed)
Requested Albuterol Neb from PCP to [facility]

## 2020-10-19 NOTE — Progress Notes (Signed)
   CC: rash, cough  HPI:  [name] is a 65 y.o. with medical history as below presenting to [facility] for lower extremity rash and cough.  Please see problem-based list for further details, assessments, and plans.  Past Medical History:  Diagnosis Date   Anxiety    Depression    Hyperlipidemia    Hypertension    Review of Systems:  As per HPI  Physical Exam:  Vitals:   10/19/20 1407  BP: 140/65  Pulse: 83  Temp: 98 F (36.7 C)  TempSrc: Oral  SpO2: 97%  Weight: 146 lb 9.6 oz (66.5 kg)  Height: 5' (1.524 m)   General: Resting comfortably in chair in no acute distress CV: Regular rate, rhythm. No murmurs, rubs, gallops appreciated. Pulm: Normal work of breathing on room air. Clear to auscultation bilaterally. No wheezing, rales, rhonchi appreciated. MSK: Normal bulk, tone. No pitting edema bilaterally. Skin: Extensive excoriations throughout lower extremities with bleeding, eschars. (Pictured below) Mildly erythematous plaques present on extensor surface of knees bilaterally. Psych: Normal mood, speech           Assessment & Plan:   See Encounters Tab for problem based charting.  Patient discussed with Dr.  Mayford Knife

## 2020-10-21 DIAGNOSIS — R053 Chronic cough: Secondary | ICD-10-CM | POA: Insufficient documentation

## 2020-10-21 DIAGNOSIS — S80819A Abrasion, unspecified lower leg, initial encounter: Secondary | ICD-10-CM | POA: Insufficient documentation

## 2020-10-21 NOTE — Assessment & Plan Note (Addendum)
[name] reports a chronic rash on her bilateral lower extremities. She mentions that these have been present since she was a child. They are very pruritic and sometimes bleed. Notes that when she was recently prescribed prednisone that this cleared up. Unfortunately they have returned and are very bothersome. [name] says they are solely on her lower extremities. Denies any known bug bites, fevers, chills, joint pains. Her partner mentions that he does not have similar rashes.  Discussed with [name] and her partner that these look similar to bug bites, but it is curious that these have only affected her and have been occurring since childhood. [name] confirms that her partner sleeps in the same bed as her and has not had these symptoms. Concern for possible fleas vs bed bugs given affected area. Once the patient and her partner left the room, a bug was found that resembled a bedbug. At this time will prescribe a short course of steroids for symptom relief, but explained that this is not a maintenance therapy and will need further work-up. Will refer to dermatology to rule out other primary skin disorders.  - Prednisone 40mg  x5 days - Dermatology referral

## 2020-10-21 NOTE — Assessment & Plan Note (Signed)
Jody Mcclure is presenting today to discuss her cough. She was recently seen by our clinic for a COPD exacerbation. At that time she was prescribed short course of steroids and antibiotic. Jody Mcclure reports compliance with medication and resolution of symptoms. She mentions that her cough as acutely worsened over the last day. Denies dyspnea or productive cough. She is taking her inhalers as needed.   Explained to Jody Mcclure that this does not appear to be a COPD exacerbation. However, due to her skin condition we will prescribe short course of prednisone. If she continues to have further episodes will need to consider advancing to triple therapy inhaler.

## 2020-10-21 NOTE — Assessment & Plan Note (Signed)
Prevnar 20 given today. 

## 2020-10-25 ENCOUNTER — Other Ambulatory Visit: Payer: Self-pay | Admitting: Student

## 2020-10-25 DIAGNOSIS — R0609 Other forms of dyspnea: Secondary | ICD-10-CM

## 2020-10-25 DIAGNOSIS — R0600 Dyspnea, unspecified: Secondary | ICD-10-CM

## 2020-10-27 NOTE — Progress Notes (Signed)
Internal Medicine Clinic Attending ? ?Case discussed with Dr. Braswell  At the time of the visit.  We reviewed the resident?s history and exam and pertinent patient test results.  I agree with the assessment, diagnosis, and plan of care documented in the resident?s note.  ?

## 2020-10-29 DIAGNOSIS — Z20822 Contact with and (suspected) exposure to covid-19: Secondary | ICD-10-CM | POA: Diagnosis not present

## 2020-11-04 DIAGNOSIS — Z20822 Contact with and (suspected) exposure to covid-19: Secondary | ICD-10-CM | POA: Diagnosis not present

## 2020-11-07 DIAGNOSIS — Z20822 Contact with and (suspected) exposure to covid-19: Secondary | ICD-10-CM | POA: Diagnosis not present

## 2020-11-09 DIAGNOSIS — R32 Unspecified urinary incontinence: Secondary | ICD-10-CM | POA: Diagnosis not present

## 2020-11-09 DIAGNOSIS — Z419 Encounter for procedure for purposes other than remedying health state, unspecified: Secondary | ICD-10-CM | POA: Diagnosis not present

## 2020-11-10 DIAGNOSIS — Z20822 Contact with and (suspected) exposure to covid-19: Secondary | ICD-10-CM | POA: Diagnosis not present

## 2020-11-12 ENCOUNTER — Other Ambulatory Visit: Payer: Self-pay | Admitting: Student

## 2020-11-12 NOTE — Telephone Encounter (Signed)
Also received fax from [facility] for refill on diclofenac gel. No longer on current med list.

## 2020-11-14 ENCOUNTER — Other Ambulatory Visit: Payer: Self-pay | Admitting: Student

## 2020-11-14 DIAGNOSIS — Z20822 Contact with and (suspected) exposure to covid-19: Secondary | ICD-10-CM | POA: Diagnosis not present

## 2020-11-14 DIAGNOSIS — M159 Polyosteoarthritis, unspecified: Secondary | ICD-10-CM

## 2020-11-14 MED ORDER — DICLOFENAC SODIUM 1 % EX GEL: 2.0000 g | Freq: Four times a day (QID) | CUTANEOUS | 0 refills | Status: DC

## 2020-11-16 ENCOUNTER — Other Ambulatory Visit: Payer: Self-pay | Admitting: *Deleted

## 2020-11-16 DIAGNOSIS — M159 Polyosteoarthritis, unspecified: Secondary | ICD-10-CM

## 2020-11-16 NOTE — Telephone Encounter (Signed)
Call from [facility] pharmacy. Ally, pharm tech, wants to know if Diclofenac sodium qty can be changed to 100 gm from 50. Stated they can only dispense 100 gm not 50. Thanks

## 2020-11-19 MED ORDER — DICLOFENAC SODIUM 1 % EX GEL: 2.0000 g | Freq: Four times a day (QID) | CUTANEOUS | 0 refills | Status: DC

## 2020-11-24 DIAGNOSIS — Z20822 Contact with and (suspected) exposure to covid-19: Secondary | ICD-10-CM | POA: Diagnosis not present

## 2020-12-10 DIAGNOSIS — Z419 Encounter for procedure for purposes other than remedying health state, unspecified: Secondary | ICD-10-CM | POA: Diagnosis not present

## 2020-12-14 ENCOUNTER — Other Ambulatory Visit: Payer: Self-pay | Admitting: Student

## 2020-12-14 DIAGNOSIS — F411 Generalized anxiety disorder: Secondary | ICD-10-CM

## 2020-12-17 ENCOUNTER — Other Ambulatory Visit: Payer: Self-pay | Admitting: *Deleted

## 2020-12-17 ENCOUNTER — Other Ambulatory Visit: Payer: Self-pay

## 2020-12-17 ENCOUNTER — Other Ambulatory Visit: Payer: Self-pay | Admitting: Student

## 2020-12-17 DIAGNOSIS — F411 Generalized anxiety disorder: Secondary | ICD-10-CM

## 2020-12-17 DIAGNOSIS — Z1152 Encounter for screening for COVID-19: Secondary | ICD-10-CM | POA: Diagnosis not present

## 2020-12-17 MED ORDER — STIOLTO RESPIMAT 2.5-2.5 MCG/ACT IN AERS: INHALATION_SPRAY | RESPIRATORY_TRACT | 0 refills | Status: DC

## 2020-12-17 MED ORDER — SERTRALINE HCL 50 MG PO TABS: 150.0000 mg | ORAL_TABLET | Freq: Every day | ORAL | 0 refills | Status: DC

## 2020-12-17 NOTE — Patient Outreach (Signed)
Care Coordination  12/17/2020  HUDSYN BARICH [birth year] [id number]   Medicaid Managed Care   Unsuccessful Outreach Note  12/17/2020 Name: Jody Mcclure MRN: [id number] DOB: Jul 28, 1955  Referred by: Evlyn Kanner, MD Reason for referral : Case Closure (Attempted outreach by MM team x 4)   Third unsuccessful telephone outreach was attempted today. The patient was referred to the case management team for assistance with care management and care coordination. The patient's primary care provider has been notified of our unsuccessful attempts to make or maintain contact with the patient. The care management team is pleased to engage with this patient at any time in the future should he/she be interested in assistance from the care management team.   Follow Up Plan: We have been unable to make contact with the patient for follow up. The care management team is available to follow up with the patient after provider conversation with the patient regarding recommendation for care management engagement and subsequent re-referral to the care management team.   [name] RN, BSN [place]  [facility] RN Care Coordinator

## 2020-12-21 NOTE — Telephone Encounter (Signed)
Regarding sertraline 50mg  tabs take 3 tabs daily  Pharmacy comment: Alternative Requested:INSURANCE WILL ONLY PAY FOR 1.5 TABLETS CHANGE TO 100MG  TABLETS.

## 2020-12-22 ENCOUNTER — Other Ambulatory Visit: Payer: Self-pay

## 2020-12-22 ENCOUNTER — Other Ambulatory Visit: Payer: Self-pay | Admitting: Student

## 2020-12-22 DIAGNOSIS — F411 Generalized anxiety disorder: Secondary | ICD-10-CM

## 2020-12-22 MED ORDER — STIOLTO RESPIMAT 2.5-2.5 MCG/ACT IN AERS: INHALATION_SPRAY | RESPIRATORY_TRACT | 0 refills | Status: DC

## 2020-12-22 MED ORDER — SERTRALINE HCL 100 MG PO TABS: 150.0000 mg | ORAL_TABLET | Freq: Every day | ORAL | 1 refills | Status: DC

## 2020-12-22 NOTE — Telephone Encounter (Signed)
Sertraline needs changed to 100 mg tabs take 1.5 per insurance. Request sent in separate encounter.  Both meds were sent to [facility] on 9/8. Patient requesting at [facility] instead.

## 2020-12-22 NOTE — Telephone Encounter (Signed)
sertraline (ZOLOFT) 50 MG tablet  Tiotropium Bromide-Olodaterol (STIOLTO RESPIMAT) 2.5-2.5 MCG/ACT AERS, REFILL REQUEST @ [facility] - [place], [place] - [address], [address].

## 2021-01-08 DIAGNOSIS — Z1152 Encounter for screening for COVID-19: Secondary | ICD-10-CM | POA: Diagnosis not present

## 2021-01-09 DIAGNOSIS — Z419 Encounter for procedure for purposes other than remedying health state, unspecified: Secondary | ICD-10-CM | POA: Diagnosis not present

## 2021-01-18 ENCOUNTER — Ambulatory Visit (INDEPENDENT_AMBULATORY_CARE_PROVIDER_SITE_OTHER): Payer: Medicaid Other | Admitting: *Deleted

## 2021-01-18 DIAGNOSIS — Z23 Encounter for immunization: Secondary | ICD-10-CM

## 2021-01-19 ENCOUNTER — Other Ambulatory Visit: Payer: Self-pay | Admitting: Student

## 2021-01-20 DIAGNOSIS — R32 Unspecified urinary incontinence: Secondary | ICD-10-CM | POA: Diagnosis not present

## 2021-02-09 DIAGNOSIS — Z419 Encounter for procedure for purposes other than remedying health state, unspecified: Secondary | ICD-10-CM | POA: Diagnosis not present

## 2021-02-24 DIAGNOSIS — Z20822 Contact with and (suspected) exposure to covid-19: Secondary | ICD-10-CM | POA: Diagnosis not present

## 2021-03-11 DIAGNOSIS — Z419 Encounter for procedure for purposes other than remedying health state, unspecified: Secondary | ICD-10-CM | POA: Diagnosis not present

## 2021-03-12 ENCOUNTER — Other Ambulatory Visit: Payer: Self-pay | Admitting: Student

## 2021-03-12 DIAGNOSIS — I10 Essential (primary) hypertension: Secondary | ICD-10-CM

## 2021-03-16 DIAGNOSIS — R32 Unspecified urinary incontinence: Secondary | ICD-10-CM | POA: Diagnosis not present

## 2021-04-11 DIAGNOSIS — Z419 Encounter for procedure for purposes other than remedying health state, unspecified: Secondary | ICD-10-CM | POA: Diagnosis not present

## 2021-04-30 DIAGNOSIS — R32 Unspecified urinary incontinence: Secondary | ICD-10-CM | POA: Diagnosis not present

## 2021-05-12 DIAGNOSIS — Z419 Encounter for procedure for purposes other than remedying health state, unspecified: Secondary | ICD-10-CM | POA: Diagnosis not present

## 2021-05-28 DIAGNOSIS — Z20822 Contact with and (suspected) exposure to covid-19: Secondary | ICD-10-CM | POA: Diagnosis not present

## 2021-06-04 DIAGNOSIS — Z20822 Contact with and (suspected) exposure to covid-19: Secondary | ICD-10-CM | POA: Diagnosis not present

## 2021-06-05 DIAGNOSIS — R32 Unspecified urinary incontinence: Secondary | ICD-10-CM | POA: Diagnosis not present

## 2021-06-06 ENCOUNTER — Other Ambulatory Visit: Payer: Self-pay | Admitting: Student

## 2021-06-06 DIAGNOSIS — F411 Generalized anxiety disorder: Secondary | ICD-10-CM

## 2021-06-09 DIAGNOSIS — Z419 Encounter for procedure for purposes other than remedying health state, unspecified: Secondary | ICD-10-CM | POA: Diagnosis not present

## 2021-06-24 DIAGNOSIS — R32 Unspecified urinary incontinence: Secondary | ICD-10-CM | POA: Diagnosis not present

## 2021-07-10 DIAGNOSIS — Z419 Encounter for procedure for purposes other than remedying health state, unspecified: Secondary | ICD-10-CM | POA: Diagnosis not present

## 2021-07-25 ENCOUNTER — Other Ambulatory Visit: Payer: Self-pay | Admitting: Student

## 2021-07-25 DIAGNOSIS — Z1152 Encounter for screening for COVID-19: Secondary | ICD-10-CM | POA: Diagnosis not present

## 2021-07-31 DIAGNOSIS — Z1152 Encounter for screening for COVID-19: Secondary | ICD-10-CM | POA: Diagnosis not present

## 2021-08-06 DIAGNOSIS — Z1152 Encounter for screening for COVID-19: Secondary | ICD-10-CM | POA: Diagnosis not present

## 2021-08-08 DIAGNOSIS — Z20822 Contact with and (suspected) exposure to covid-19: Secondary | ICD-10-CM | POA: Diagnosis not present

## 2021-08-23 ENCOUNTER — Ambulatory Visit (INDEPENDENT_AMBULATORY_CARE_PROVIDER_SITE_OTHER): Payer: Medicaid Other | Admitting: Internal Medicine

## 2021-08-23 ENCOUNTER — Other Ambulatory Visit: Payer: Self-pay

## 2021-08-23 ENCOUNTER — Encounter: Payer: Self-pay | Admitting: Internal Medicine

## 2021-08-23 DIAGNOSIS — J069 Acute upper respiratory infection, unspecified: Secondary | ICD-10-CM | POA: Diagnosis not present

## 2021-08-23 NOTE — Assessment & Plan Note (Addendum)
Patient presents for 3-day history of worsening cough, nasal and chest congestion.  She has a history of COPD, supported by PFT results, and has a chronic cough.  3 days ago she noted worsening cough that is nonproductive, nasal congestion, postnasal drip, now with sore throat.  She is also noted watering eyes, decreased energy, intermittent headache, thinks she may have had a fever yesterday.  She denies poor appetite, nausea/vomiting, earache, night sweats, chills, shortness of breath.  She has not felt wheezy or needed to use her albuterol more often.  She endorses adherence to [employer] Respimat daily.  She reports overall she does feel a bit better today already.  At home she has been using Tylenol.  She lives in a facility where she is tested regularly for COVID, her and her fianc? were last tested on Sunday and expect the results on Wednesday. ? ?Patient afebrile, mildly hypertensive, no wheezing, rhonchi, decreased breath sounds on lung auscultation.  Patient appears energetic and happy in clinic today. ? ?On assessment, suspect patient has a viral upper respiratory infection without COPD exacerbation.  Patient's symptoms are already improving, unclear COVID status since test is still pending, do not feel she would benefit from Paxlovid since she is already improving. Discussed additional conservative symptom management with over-the-counter medications as below.  Discussed return precautions and symptoms to look out for including worsening sinus pain, worsening cough, shortness of breath, pleuritic chest pain, poor p.o. intake as this may be a sign of secondary bacterial infection. ?Plan: ?-Daily second-generation antihistamine and saline nasal sprays for nasal congestion ?-Mucinex for chest and nasal congestion ?-Robitussin as needed for cough ?-Continue Tylenol for sinus pain, headache, fevers ?-Return precautions given ?-Patient will need to self isolate if COVID-positive. ?-Follow-up in 3 months for  routine appointment ?

## 2021-08-23 NOTE — Progress Notes (Signed)
?CC: worsening cough and congestion for 3d ? ?HPI: ? ?[name] is a 66 y.o. female with a past medical history stated below and presents today for worsening cough and congestion for 3d. Please see problem based assessment and plan for additional details. ? ?Past Medical History:  ?Diagnosis Date  ? Anxiety   ? Depression   ? Hyperlipidemia   ? Hypertension   ? ? ?Current Outpatient Medications on File Prior to Visit  ?Medication Sig Dispense Refill  ? albuterol (PROVENTIL) (2.5 MG/3ML) 0.083% nebulizer solution Take 3 mLs (2.5 mg total) by nebulization 2 (two) times daily as needed for wheezing or shortness of breath. 75 mL 0  ? amLODipine (NORVASC) 5 MG tablet TAKE ONE TABLET BY MOUTH ONCE DAILY 90 tablet 3  ? betamethasone valerate ointment (VALISONE) 0.1 % APPLY TO AFFECTED AREA TWICE A DAY 60 g 3  ? diclofenac Sodium (VOLTAREN) 1 % GEL Apply 2 g topically 4 (four) times daily. 100 g 0  ? hydrOXYzine (ATARAX/VISTARIL) 25 MG tablet Take 1 tablet (25 mg total) by mouth 3 (three) times daily as needed. 270 tablet 2  ? Incontinence Supply Disposable (DEPEND EASY FIT UNDERGARMENTS) MISC 1 Units by Does not apply route 3 (three) times daily. 100 each 11  ? polyethylene glycol (MIRALAX / GLYCOLAX) 17 g packet Take 17 g by mouth daily. (Patient not taking: No sig reported) 14 each 0  ? pravastatin (PRAVACHOL) 40 MG tablet Take 1 tablet (40 mg total) by mouth every evening. 30 tablet 11  ? sertraline (ZOLOFT) 100 MG tablet TAKE 1 AND 1/2 TABLETS BY MOUTH ONCE DAILY 135 tablet 1  ? Tiotropium Bromide-Olodaterol (STIOLTO RESPIMAT) 2.5-2.5 MCG/ACT AERS INHALE TWO PUFFS BY MOUTH INTO LUNGS ONCE DAILY 4 g 6  ? [DISCONTINUED] omeprazole (PRILOSEC OTC) 20 MG tablet Take 1 tablet (20 mg total) by mouth daily. 30 tablet 3  ? ?No current facility-administered medications on file prior to visit.  ? ? ?Family History  ?Problem Relation Age of Onset  ? Cancer Mother   ?     lung  ? Hyperlipidemia Mother   ? Hypertension Father    ? Hyperlipidemia Father   ? Colon cancer Neg Hx   ? Colon polyps Neg Hx   ? Rectal cancer Neg Hx   ? Stomach cancer Neg Hx   ? ? ?Review of Systems: ?ROS negative except for what is noted on the assessment and plan. ? ?Vitals:  ? 08/23/21 1405  ?BP: (!) 154/91  ?Pulse: 96  ?Temp: 98.3 ?F (36.8 ?C)  ?TempSrc: Oral  ?SpO2: 97%  ?Weight: 138 lb 14.4 oz (63 kg)  ?Height: 5' (1.524 m)  ? ? ? ?Physical Exam: ?General: Well appearing, overweight caucasian female, NAD ?HENT: normocephalic, atraumatic, external ears and nares appear unremarkable ?EYES: conjunctiva non-erythematous, no scleral icterus ?CV: regular rate, normal rhythm, no murmurs, rubs, gallops. No LEE.  ?Pulmonary: normal work of breathing on RA, lungs clear to auscultation, no rales, wheezes, rhonchi. ?Skin: Warm and dry, no rashes or lesions on exposed surfaces ?Neurological: ?MS: awake, alert and oriented x3, normal speech and fund of knowledge ?Motor: moves all extremities antigravity ?Psych: happy affect ? ? ? ?Assessment & Plan:  ? ?Viral upper respiratory infection ?Patient presents for 3-day history of worsening cough, nasal and chest congestion.  She has a history of COPD, supported by PFT results, and has a chronic cough.  3 days ago she noted worsening cough that is nonproductive, nasal congestion, postnasal drip,  now with sore throat.  She is also noted watering eyes, decreased energy, intermittent headache, thinks she may have had a fever yesterday.  She denies poor appetite, nausea/vomiting, earache, night sweats, chills, shortness of breath.  She has not felt wheezy or needed to use her albuterol more often.  She endorses adherence to [employer] Respimat daily.  She reports overall she does feel a bit better today already.  At home she has been using Tylenol.  She lives in a facility where she is tested regularly for COVID, her and her fianc? were last tested on Sunday and expect the results on Wednesday. ? ?Patient afebrile, mildly  hypertensive, no wheezing, rhonchi, decreased breath sounds on lung auscultation.  Patient appears energetic and happy in clinic today. ? ?On assessment, suspect patient has a viral upper respiratory infection without COPD exacerbation.  Patient's symptoms are already improving, unclear COVID status since test is still pending, do not feel she would benefit from Paxlovid since she is already improving. Discussed additional conservative symptom management with over-the-counter medications as below.  Discussed return precautions and symptoms to look out for including worsening sinus pain, worsening cough, shortness of breath, pleuritic chest pain, poor p.o. intake as this may be a sign of secondary bacterial infection. ?Plan: ?-Daily second-generation antihistamine and saline nasal sprays for nasal congestion ?-Mucinex for chest and nasal congestion ?-Robitussin as needed for cough ?-Continue Tylenol for sinus pain, headache, fevers ?-Return precautions given ?-Patient will need to self isolate if COVID-positive. ?-Follow-up in 3 months for routine appointment ? ? ?Patient discussed with Dr. Heide Spark ? ?Ellison Carwin, M.D. ?[facility] Internal Medicine, PGY-1 ?Pager: [phone] ?Date 08/23/2021 Time 3:44 PM ? ?

## 2021-08-23 NOTE — Patient Instructions (Signed)
Thank you, Ms.Teryl ZANIA KALISZ for allowing [place] to provide your care today. Today we discussed: ? ?Common Cold: ?-Continue to take tylenol for pain and fever, can take up to 1,000mg  three times a day. No more than 3,000mg  a day. ?-Start mucinex over the counter ?-Start saline nasal spray over the counter ?-Start robitussin over the counter ?-Start zyrtec or allegra daily for congestion  ?-Follow up on the COVID test ? ? ?My Chart Access: ?[email or web address] ? ?Please follow-up in 3 months. ? ?Please make sure to arrive 15 minutes prior to your next appointment. If you arrive late, you may be asked to reschedule.  ?  ?We look forward to seeing you next time. Please call our clinic at [phone] if you have any questions or concerns. The best time to call is Monday-Friday from 9am-4pm, but there is someone available 24/7. If after hours or the weekend, call the main hospital number and ask for the Internal Medicine Resident On-Call. If you need medication refills, please notify your pharmacy one week in advance and they will send [place] a request. ?  ?Thank you for letting us take part in your care. Wishing you the best! ? ?Ellison Carwin, MD ?08/23/2021, 2:31 PM ?IM Resident, PGY-1 ? ?

## 2021-08-24 NOTE — Progress Notes (Signed)
Internal Medicine Clinic Attending ? ?Case discussed with Dr. Zinoviev  At the time of the visit.  We reviewed the resident?s history and exam and pertinent patient test results.  I agree with the assessment, diagnosis, and plan of care documented in the resident?s note.  ?

## 2021-09-02 DIAGNOSIS — R32 Unspecified urinary incontinence: Secondary | ICD-10-CM | POA: Diagnosis not present

## 2021-09-06 ENCOUNTER — Other Ambulatory Visit: Payer: Self-pay | Admitting: Student

## 2021-09-06 DIAGNOSIS — E782 Mixed hyperlipidemia: Secondary | ICD-10-CM

## 2021-09-09 DIAGNOSIS — Z419 Encounter for procedure for purposes other than remedying health state, unspecified: Secondary | ICD-10-CM | POA: Diagnosis not present

## 2021-09-20 ENCOUNTER — Encounter: Payer: Medicaid Other | Admitting: Internal Medicine

## 2021-09-22 DIAGNOSIS — R32 Unspecified urinary incontinence: Secondary | ICD-10-CM | POA: Diagnosis not present

## 2021-09-27 ENCOUNTER — Encounter: Payer: 59 | Admitting: Internal Medicine

## 2021-09-27 NOTE — Progress Notes (Deleted)
   CC: ***  HPI:  [name] is a 66 y.o. with medical history as below presenting to [facility] for ***  Please see problem-based list for further details, assessments, and plans.  Past Medical History:  Diagnosis Date   Anxiety    Depression    Hyperlipidemia    Hypertension      Current Outpatient Medications (Cardiovascular):    amLODipine (NORVASC) 5 MG tablet, TAKE ONE TABLET BY MOUTH ONCE DAILY   pravastatin (PRAVACHOL) 40 MG tablet, TAKE ONE TABLET BY MOUTH EVERY EVENING  Current Outpatient Medications (Respiratory):    albuterol (PROVENTIL) (2.5 MG/3ML) 0.083% nebulizer solution, Take 3 mLs (2.5 mg total) by nebulization 2 (two) times daily as needed for wheezing or shortness of breath.   Tiotropium Bromide-Olodaterol (STIOLTO RESPIMAT) 2.5-2.5 MCG/ACT AERS, INHALE TWO PUFFS BY MOUTH INTO LUNGS ONCE DAILY    Current Outpatient Medications (Other):    betamethasone valerate ointment (VALISONE) 0.1 %, APPLY TO AFFECTED AREA TWICE A DAY   diclofenac Sodium (VOLTAREN) 1 % GEL, Apply 2 g topically 4 (four) times daily.   hydrOXYzine (ATARAX/VISTARIL) 25 MG tablet, Take 1 tablet (25 mg total) by mouth 3 (three) times daily as needed.   Incontinence Supply Disposable (DEPEND EASY FIT UNDERGARMENTS) MISC, 1 Units by Does not apply route 3 (three) times daily.   polyethylene glycol (MIRALAX / GLYCOLAX) 17 g packet, Take 17 g by mouth daily. (Patient not taking: No sig reported)   sertraline (ZOLOFT) 100 MG tablet, TAKE 1 AND 1/2 TABLETS BY MOUTH ONCE DAILY  Review of Systems:  Review of system negative unless stated in the problem list or HPI.    Physical Exam:  There were no vitals filed for this visit.  Physical Exam General: NAD HENT: NCAT Lungs: CTAB, no wheeze, rhonchi or rales.  Cardiovascular: Normal heart sounds, no r/m/g, 2+ pulses in all extremities. No LE edema Abdomen: No TTP, normal bowel sounds MSK: No asymmetry or muscle atrophy.  Skin: no lesions noted  on exposed skin Neuro: Alert and oriented x4. CN grossly intact Psych: Normal mood and normal affect   Assessment & Plan:   See Encounters Tab for problem based charting.  Patient {GC/GE:[id number]::"discussed with","seen with"} Dr. {NAMES:[id number]::"Butcher","Guilloud","Hoffman","Mullen","Narendra","Raines","Vincent"} Gwenevere Abbot, MD   Assessment/Plan BP at goal/not at goal (< 130/80). BP in clinic today ***, HR ***. Reported home readings with SBP ***, and Dia ***. Does not monitor BP level at home; educated on the importance of it. Home medications include ***. Reports good poor medication compliance. Tolerating medication without adverse effects. Denies headaches, vision changes, lightheadedness, chest pain, SHOB, leg swelling or changes in speech. Exam benign and vitals otherwise wnl. Last creatinine was *** in ***. Counseled on the importance of daily exercise, low salt diet, and weight loss. Continue *** Start *** BP log and bring to next visit  Continue lifestyle changes F/u on BMP *** (2-3 weeks after starting HCTZ, lasix or ACE/ARB and 6 mo-1 year on chronic therapy.)   COPD  HLD On pravastatin 40 mg qd.  Depression On sertraline 100 mg qd.   Stress Incontinence

## 2021-11-08 DIAGNOSIS — R32 Unspecified urinary incontinence: Secondary | ICD-10-CM | POA: Diagnosis not present

## 2021-11-23 ENCOUNTER — Other Ambulatory Visit: Payer: Self-pay

## 2021-11-23 DIAGNOSIS — F411 Generalized anxiety disorder: Secondary | ICD-10-CM

## 2021-11-24 MED ORDER — SERTRALINE HCL 100 MG PO TABS: 150.0000 mg | ORAL_TABLET | Freq: Every day | ORAL | 1 refills | Status: DC

## 2021-12-27 ENCOUNTER — Ambulatory Visit (INDEPENDENT_AMBULATORY_CARE_PROVIDER_SITE_OTHER): Payer: Medicare Other | Admitting: Student

## 2021-12-27 ENCOUNTER — Ambulatory Visit (INDEPENDENT_AMBULATORY_CARE_PROVIDER_SITE_OTHER): Payer: Medicare Other

## 2021-12-27 ENCOUNTER — Encounter: Payer: Self-pay | Admitting: Student

## 2021-12-27 ENCOUNTER — Other Ambulatory Visit: Payer: Self-pay

## 2021-12-27 VITALS — BP 126/72 | HR 97 | Temp 98.3°F | Ht 60.0 in | Wt 137.0 lb

## 2021-12-27 DIAGNOSIS — R0609 Other forms of dyspnea: Secondary | ICD-10-CM

## 2021-12-27 DIAGNOSIS — J449 Chronic obstructive pulmonary disease, unspecified: Secondary | ICD-10-CM

## 2021-12-27 DIAGNOSIS — Z0000 Encounter for general adult medical examination without abnormal findings: Secondary | ICD-10-CM

## 2021-12-27 DIAGNOSIS — Z1231 Encounter for screening mammogram for malignant neoplasm of breast: Secondary | ICD-10-CM

## 2021-12-27 DIAGNOSIS — G4762 Sleep related leg cramps: Secondary | ICD-10-CM

## 2021-12-27 DIAGNOSIS — G5603 Carpal tunnel syndrome, bilateral upper limbs: Secondary | ICD-10-CM

## 2021-12-27 DIAGNOSIS — R252 Cramp and spasm: Secondary | ICD-10-CM

## 2021-12-27 DIAGNOSIS — Z23 Encounter for immunization: Secondary | ICD-10-CM | POA: Diagnosis not present

## 2021-12-27 DIAGNOSIS — I10 Essential (primary) hypertension: Secondary | ICD-10-CM

## 2021-12-27 DIAGNOSIS — Z87891 Personal history of nicotine dependence: Secondary | ICD-10-CM

## 2021-12-27 DIAGNOSIS — E782 Mixed hyperlipidemia: Secondary | ICD-10-CM

## 2021-12-27 MED ORDER — TRELEGY ELLIPTA 100-62.5-25 MCG/ACT IN AEPB: 1.0000 | INHALATION_SPRAY | Freq: Every day | RESPIRATORY_TRACT | 2 refills | Status: DC

## 2021-12-27 NOTE — Patient Instructions (Addendum)
Ms.Sumeya MARGOT ORIORDAN, it was a pleasure seeing you today!  Today we discussed: - Inhaler: I have switched your inhaler from Stiolto to Trelegy. You take this once daily. Do not take Stiolto any longer.  - For your leg cramps, we are going to get some lab work. I'll call you with the results.   - We gave you your flu vaccine today.   I have ordered the following labs today:   Lab Orders         Ferritin         Iron and IBC ([id number])         Magnesium         BMP8+Anion Gap         Lipid Profile      I have ordered the following medication/changed the following medications:   Stop the following medications: Medications Discontinued During This Encounter  Medication Reason   Tiotropium Bromide-Olodaterol (STIOLTO RESPIMAT) 2.5-2.5 MCG/ACT AERS      Start the following medications: Meds ordered this encounter  Medications   Fluticasone-Umeclidin-Vilant (TRELEGY ELLIPTA) 100-62.5-25 MCG/ACT AEPB    Sig: Inhale 1 puff into the lungs daily.    Dispense:  2 each    Refill:  2     Follow-up: 3 months   Please make sure to arrive 15 minutes prior to your next appointment. If you arrive late, you may be asked to reschedule.   We look forward to seeing you next time. Please call our clinic at [phone] if you have any questions or concerns. The best time to call is Monday-Friday from 9am-4pm, but there is someone available 24/7. If after hours or the weekend, call the main hospital number and ask for the Internal Medicine Resident On-Call. If you need medication refills, please notify your pharmacy one week in advance and they will send [place] a request.  Thank you for letting us take part in your care. Wishing you the best!  Thank you, Sanjuan Dame, MD

## 2021-12-27 NOTE — Patient Instructions (Signed)

## 2021-12-27 NOTE — Progress Notes (Signed)
Subjective:   Jody Mcclure is a 66 y.o. female who presents for an Initial Medicare Annual Wellness Visit.  I connected with  [name] on 12/27/21 by a  IN PERSON [facility]      Patient Location: Other:  IN PERSON [facility]   Provider Location: Office/Clinic  I discussed the limitations of evaluation and management by telemedicine. The patient expressed understanding and agreed to proceed.   Review of Systems    DEFERRED TO PCP        Objective:    There were no vitals filed for this visit. There is no height or weight on file to calculate BMI.     12/27/2021    3:01 PM 08/23/2021    2:07 PM 10/19/2020    2:14 PM 08/17/2020    2:20 PM 07/28/2020    4:35 PM 07/06/2020    2:39 PM 12/30/2019    1:54 PM  Advanced Directives  Does Patient Have a Medical Advance Directive? No No No No No No No  Would patient like information on creating a medical advance directive? No - Patient declined No - Patient declined  No - Patient declined Yes (MAU/Ambulatory/Procedural Areas - Information given) No - Patient declined Yes (MAU/Ambulatory/Procedural Areas - Information given)    Current Medications (verified) Outpatient Encounter Medications as of 12/27/2021  Medication Sig   albuterol (PROVENTIL) (2.5 MG/3ML) 0.083% nebulizer solution Take 3 mLs (2.5 mg total) by nebulization 2 (two) times daily as needed for wheezing or shortness of breath.   amLODipine (NORVASC) 5 MG tablet TAKE ONE TABLET BY MOUTH ONCE DAILY   betamethasone valerate ointment (VALISONE) 0.1 % APPLY TO AFFECTED AREA TWICE A DAY   diclofenac Sodium (VOLTAREN) 1 % GEL Apply 2 g topically 4 (four) times daily.   hydrOXYzine (ATARAX/VISTARIL) 25 MG tablet Take 1 tablet (25 mg total) by mouth 3 (three) times daily as needed.   Incontinence Supply Disposable (DEPEND EASY FIT UNDERGARMENTS) MISC 1 Units by Does not apply route 3 (three) times daily.   polyethylene glycol (MIRALAX / GLYCOLAX) 17 g packet Take 17 g by mouth daily.  (Patient not taking: No sig reported)   pravastatin (PRAVACHOL) 40 MG tablet TAKE ONE TABLET BY MOUTH EVERY EVENING   sertraline (ZOLOFT) 100 MG tablet Take 1.5 tablets (150 mg total) by mouth daily.   Tiotropium Bromide-Olodaterol (STIOLTO RESPIMAT) 2.5-2.5 MCG/ACT AERS INHALE TWO PUFFS BY MOUTH INTO LUNGS ONCE DAILY   [DISCONTINUED] omeprazole (PRILOSEC OTC) 20 MG tablet Take 1 tablet (20 mg total) by mouth daily.   No facility-administered encounter medications on file as of 12/27/2021.    Allergies (verified) Codeine and Sulfa antibiotics   History: Past Medical History:  Diagnosis Date   Anxiety    Depression    Hyperlipidemia    Hypertension    Past Surgical History:  Procedure Laterality Date   ABDOMINAL HYSTERECTOMY     TONSILLECTOMY     Family History  Problem Relation Age of Onset   Cancer Mother        lung   Hyperlipidemia Mother    Hypertension Father    Hyperlipidemia Father    Colon cancer Neg Hx    Colon polyps Neg Hx    Rectal cancer Neg Hx    Stomach cancer Neg Hx    Social History   Socioeconomic History   Marital status: Single    Spouse name: Not on file   Number of children: 2   Years of  education: Not on file   Highest education level: Not on file  Occupational History   Occupation: Unemployed   Tobacco Use   Smoking status: Former    Packs/day: 0.50    Types: Cigarettes    Quit date: 01/03/2018    Years since quitting: 3.9   Smokeless tobacco: Never   Tobacco comments:    1 puff occassionally  Vaping Use   Vaping Use: Never used  Substance and Sexual Activity   Alcohol use: Yes    Alcohol/week: 2.0 standard drinks of alcohol    Types: 2 Cans of beer per week    Comment: 1 daily   Drug use: Not Currently    Types: Cocaine   Sexual activity: Yes  Other Topics Concern   Not on file  Social History Narrative   Daily caffeine    Social Determinants of Health   Financial Resource Strain: Not on file  Food Insecurity: Not on  file  Transportation Needs: Not on file  Physical Activity: Not on file  Stress: Not on file  Social Connections: Not on file    Tobacco Counseling Counseling given: Not Answered Tobacco comments: 1 puff occassionally   Clinical Intake:                 Diabetic?NO          Activities of Daily Living    12/27/2021    2:59 PM 08/23/2021    2:07 PM  In your present state of health, do you have any difficulty performing the following activities:  Hearing? 0 0  Vision? 0 0  Difficulty concentrating or making decisions? 1 1  Comment  AT TIMES  Walking or climbing stairs? 1 1  Dressing or bathing? 0 0  Doing errands, shopping? 0 0    Patient Care Team: Evlyn KannerBraswell, Phillip, MD as PCP - General Zettie PhoKennedy, Nathan K, [facility] as Pharmacist (Pharmacist) Shaune LeeksShields, Alexis J as Social Worker  Indicate any recent Medical Services you may have received from other than [facility] providers in the past year (date may be approximate).     Assessment:   This is a routine wellness examination for Jody MossesDiana.  Hearing/Vision screen No results found.  Dietary issues and exercise activities discussed:     Goals Addressed   None   Depression Screen    12/27/2021    2:58 PM 08/23/2021    2:07 PM 10/19/2020    2:13 PM 08/17/2020    2:21 PM 07/28/2020    4:36 PM 07/06/2020    2:28 PM 12/30/2019    3:10 PM  PHQ 2/9 Scores  PHQ - 2 Score 0 1 0 1 0 2 3  PHQ- 9 Score   0  5 9 16     Fall Risk    12/27/2021    2:58 PM 08/23/2021    2:07 PM 10/19/2020    2:13 PM 08/17/2020    2:21 PM 07/28/2020    4:35 PM  Fall Risk   Falls in the past year? 0 0 0 0 0  Number falls in past yr: 0   0   Injury with Fall? 0   0   Risk for fall due to : No Fall Risks No Fall Risks No Fall Risks No Fall Risks   Follow up Falls evaluation completed;Falls prevention discussed Falls evaluation completed  Falls evaluation completed     FALL RISK PREVENTION PERTAINING TO THE HOME:  Any stairs in or around the home?  Yes  If  so, are there any without handrails? No  Home free of loose throw rugs in walkways, pet beds, electrical cords, etc? Yes  Adequate lighting in your home to reduce risk of falls? Yes   ASSISTIVE DEVICES UTILIZED TO PREVENT FALLS:  Life alert? No  Use of a cane, walker or w/c? No  Grab bars in the bathroom? Yes  Shower chair or bench in shower? Yes  Elevated toilet seat or a handicapped toilet? No   TIMED UP AND GO:  Was the test performed? No .  Length of time to ambulate 10 feet: N/A sec.     Cognitive Function:        Immunizations Immunization History  Administered Date(s) Administered   Influenza Inj Mdck Quad Pf 02/09/2018   Influenza,inj,Quad PF,6+ Mos 06/03/2016, 12/30/2019, 01/18/2021   PFIZER(Purple Top)SARS-COV-2 Vaccination 07/05/2019, 07/26/2019, 04/11/2020   PNEUMOCOCCAL CONJUGATE-20 10/19/2020   Tdap 09/06/2011, 10/07/2014, 07/06/2017    TDAP status: Up to date  Flu Vaccine status: Up to date  Pneumococcal vaccine status: Up to date  Covid-19 vaccine status: Completed vaccines  Qualifies for Shingles Vaccine? Yes   Zostavax completed No   Shingrix Completed?: No.    Education has been provided regarding the importance of this vaccine. Patient has been advised to call insurance company to determine out of pocket expense if they have not yet received this vaccine. Advised may also receive vaccine at local pharmacy or Health Dept. Verbalized acceptance and understanding.  Screening Tests Health Maintenance  Topic Date Due   Zoster Vaccines- Shingrix (1 of 2) Never done   MAMMOGRAM  07/29/2009   COVID-19 Vaccine (4 - Pfizer risk series) 06/06/2020   DEXA SCAN  Never done   INFLUENZA VACCINE  11/09/2021   COLONOSCOPY (Pts 45-48yrs Insurance coverage will need to be confirmed)  06/06/2022   TETANUS/TDAP  07/07/2027   Pneumonia Vaccine 84+ Years old  Completed   Hepatitis C Screening  Completed   HIV Screening  Completed   HPV VACCINES  Aged  Out   PAP SMEAR-Modifier  Discontinued    Health Maintenance  Health Maintenance Due  Topic Date Due   Zoster Vaccines- Shingrix (1 of 2) Never done   MAMMOGRAM  07/29/2009   COVID-19 Vaccine (4 - Pfizer risk series) 06/06/2020   DEXA SCAN  Never done   INFLUENZA VACCINE  11/09/2021    Colorectal cancer screening: Type of screening: Colonoscopy. Completed 2/26/20214. Repeat every 10 years  Mammogram status: Completed 07/30/2007. Repeat every 2  year    Lung Cancer Screening: (Low Dose CT Chest recommended if Age 59-80 years, 30 pack-year currently smoking OR have quit w/in 15years.) does not qualify.   Lung Cancer Screening Referral: DEFERRED TO PCP   Additional Screening:  Hepatitis C Screening: does qualify; Completed 11/21/2018  Vision Screening: Recommended annual ophthalmology exams for early detection of glaucoma and other disorders of the eye. Is the patient up to date with their annual eye exam?  No  Who is the provider or what is the name of the office in which the patient attends annual eye exams?  PT WILL BE CONTACTING [facility]  If pt is not established with a provider, would they like to be referred to a provider to establish care? No .   Dental Screening: Recommended annual dental exams for proper oral hygiene  Community Resource Referral / Chronic Care Management: CRR required this visit?  No   CCM required this visit?  No  Plan:     I have personally reviewed and noted the following in the patient's chart:   Medical and social history Use of alcohol, tobacco or illicit drugs  Current medications and supplements including opioid prescriptions. Patient is not currently taking opioid prescriptions. Functional ability and status Nutritional status Physical activity Advanced directives List of other physicians Hospitalizations, surgeries, and ER visits in previous 12 months Vitals Screenings to include cognitive, depression, and  falls Referrals and appointments  In addition, I have reviewed and discussed with patient certain preventive protocols, quality metrics, and best practice recommendations. A written personalized care plan for preventive services as well as general preventive health recommendations were provided to patient.     Judyann Munson, CMA   12/27/2021   Nurse Notes:  IN PERSON  [facility]    Jody Mcclure , Thank you for taking time to come for your Medicare Wellness Visit. I appreciate your ongoing commitment to your health goals. Please review the following plan we discussed and let me know if I can assist you in the future.   These are the goals we discussed:  Goals   None     This is a list of the screening recommended for you and due dates:  Health Maintenance  Topic Date Due   Zoster (Shingles) Vaccine (1 of 2) Never done   Mammogram  07/29/2009   COVID-19 Vaccine (4 - Pfizer risk series) 06/06/2020   DEXA scan (bone density measurement)  Never done   Flu Shot  11/09/2021   Colon Cancer Screening  06/06/2022   Tetanus Vaccine  07/07/2027   Pneumonia Vaccine  Completed   Hepatitis C Screening: USPSTF Recommendation to screen - Ages 18-79 yo.  Completed   HIV Screening  Completed   HPV Vaccine  Aged Out   Pap Smear  Discontinued

## 2021-12-28 ENCOUNTER — Telehealth (HOSPITAL_BASED_OUTPATIENT_CLINIC_OR_DEPARTMENT_OTHER): Payer: Self-pay

## 2021-12-28 LAB — BMP8+ANION GAP
Anion Gap: 16 mmol/L (ref 10.0–18.0)
BUN/Creatinine Ratio: 19 (ref 12–28)
BUN: 10 mg/dL (ref 8–27)
CO2: 25 mmol/L (ref 20–29)
Calcium: 10.2 mg/dL (ref 8.7–10.3)
Chloride: 97 mmol/L (ref 96–106)
Creatinine, Ser: 0.54 mg/dL — ABNORMAL LOW (ref 0.57–1.00)
Glucose: 107 mg/dL — ABNORMAL HIGH (ref 70–99)
Potassium: 4.5 mmol/L (ref 3.5–5.2)
Sodium: 138 mmol/L (ref 134–144)
eGFR: 102 mL/min/{1.73_m2} (ref >59)

## 2021-12-28 LAB — LIPID PANEL
Chol/HDL Ratio: 5.8 ratio — ABNORMAL HIGH (ref 0.0–4.4)
Cholesterol, Total: 272 mg/dL — ABNORMAL HIGH (ref 100–199)
HDL: 47 mg/dL (ref >39)
LDL Chol Calc (NIH): 137 mg/dL — ABNORMAL HIGH (ref 0–99)
Triglycerides: 479 mg/dL — ABNORMAL HIGH (ref 0–149)
VLDL Cholesterol Cal: 88 mg/dL — ABNORMAL HIGH (ref 5–40)

## 2021-12-28 LAB — IRON AND TIBC
Iron Saturation: 19 % (ref 15–55)
Iron: 66 ug/dL (ref 27–139)
Total Iron Binding Capacity: 352 ug/dL (ref 250–450)
UIBC: 286 ug/dL (ref 118–369)

## 2021-12-28 LAB — MAGNESIUM: Magnesium: 1.6 mg/dL (ref 1.6–2.3)

## 2021-12-28 LAB — FERRITIN: Ferritin: 144 ng/mL (ref 15–150)

## 2021-12-29 DIAGNOSIS — R252 Cramp and spasm: Secondary | ICD-10-CM | POA: Insufficient documentation

## 2021-12-29 DIAGNOSIS — G5603 Carpal tunnel syndrome, bilateral upper limbs: Secondary | ICD-10-CM | POA: Insufficient documentation

## 2021-12-29 MED ORDER — ALBUTEROL SULFATE HFA 108 (90 BASE) MCG/ACT IN AERS: 2.0000 | INHALATION_SPRAY | Freq: Four times a day (QID) | RESPIRATORY_TRACT | 2 refills | Status: DC | PRN

## 2021-12-29 MED ORDER — ALBUTEROL SULFATE (2.5 MG/3ML) 0.083% IN NEBU: 2.5000 mg | INHALATION_SOLUTION | Freq: Two times a day (BID) | RESPIRATORY_TRACT | 0 refills | Status: AC | PRN

## 2021-12-29 NOTE — Assessment & Plan Note (Addendum)
Last lipid panel 12/2019, will repeat today w/ previous LDL 175. Patient reports compliance with pravastatin.   - Repeat lipid panel today  ADDENDUM:  LDL still elevated, 137 today. Current ASCVD 10-yr risk 9.0%. Since she is not at goal yet, we will increase her dose of moderate-intensity statin. She is agreeable to the plan.  - Increase pravastatin 80mg  daily

## 2021-12-29 NOTE — Progress Notes (Signed)
CC: cough, cramps  HPI:  [name] is a 66 y.o. person with COPD, hypertension, hyperlipidemia, generalized anxiety disorder presenting to [facility] for chronic cough and nocturnal leg cramps.  Please see problem-based list for further details, assessments, and plans.  Past Medical History:  Diagnosis Date   Anxiety    Depression    Hyperlipidemia    Hypertension    Review of Systems:  As per HPI  Physical Exam:  Vitals:   12/27/21 1455  BP: 126/72  Pulse: 97  Temp: 98.3 F (36.8 C)  TempSrc: Oral  SpO2: 97%  Weight: 137 lb (62.1 kg)  Height: 5' (1.524 m)   General: Resting comfortably in no acute distress CV: Regular rate, rhythm. No murmurs appreciated. Distal pulses 2+ bilaterally. Pulm: Normal work of breathing on room air. Mild end-expiratory wheezing appreciated bilateral upper lung fields. MSK: Normal bulk, tone. No peripheral edema noted. Legs appear circumferentially the same size. Positive Phalen, Tinel signs. Tenderness over bilateral thenar eminences without overlying erythema, warmth. Neuro: Awake, alert, conversing appropriately. Grossly non-focal.   Assessment & Plan:   COPD (chronic obstructive pulmonary disease) (HCC) Jody Mcclure reports that she continues to have a chronic cough that has started to affect her quality of life. Mentions that the cough is typically dry and is not associated with a certain time of day. Her breathing is "so-so," mentions she can walk ~1-2 blocks before becoming short of breath. Also mentions dyspnea when coming from the car to the clinic. In addition, she has been using her albuterol nebulizer most days. She has been compliant with her daily Stiolto.   At this juncture I do believe Jody Mcclure would most benefit from up-titrating to triple therapy. Although she has not had a recent hospitalization, her symptoms appear to be severe enough to warrant this escalation. PFT's three years ago revealed severe COPD w/ FEV1 49% predicted.  If symptoms do not improve with this change will plan to refer to pulmonology.  - STOP Stiolto - START daily Trelegy - Albuterol inhaler as needed  - Albuterol nebulizer as needed at home - If symptoms do not improve, consider referral to pulmonology  Hypertension BP Readings from Last 3 Encounters:  12/27/21 126/72  08/23/21 (!) 154/91  10/19/20 140/65   Blood pressure remains at goal. Denies chest pain, dyspnea, lightheadedness. Will continue with current regimen.  - Amlodipine 5mg  daily  Hypercholesterolemia with hypertriglyceridemia Last lipid panel 12/2019, will repeat today w/ LDL 175. Patient reports compliance with pravastatin.   - Repeat lipid panel today  Healthcare maintenance - Flu vaccine given today - Prevnar 20 given at age 2. Discussed with pt starting PPVS23 series vs waiting 93yr for another PCV20. She has elected to wait for the next PCV20, due 2027.  - Mammogram ordered today  Bilateral leg cramps Jody Mcclure reports cramps that occur in both of her bilateral lower extremities, especially in the evening times. Cramps occur in both legs equally and have been occurring for a few months now.  Also reports that they frequently cramp when she walks and continue when she stops. No recent change in activity level or travel.   On exam, she has good lower extremity pulses. Will obtain electrolytes, iron panel for further evaluation.   - Follow-up BMP, Mg, iron panel  Bilateral carpal tunnel syndrome During today's visit, Jody Mcclure mentioned that she thinks she is developing arthritis in her hands. Describes that her hands frequently hurt, especially towards the ends of the  day. Also described that opening cans has increasingly become more difficult.   On exam, there is tenderness over both thenar eminences with both Phalen and Tinel signs positive, most likely representing carpal tunnel. Will plan for nightly braces, consider orthopedic/sports medicine referral if  persists.   - Nightly hand braces - Surgery referral if persists  Patient discussed with Dr. Andrey Spearman, MD Internal Medicine PGY-3 Pager: [phone]

## 2021-12-29 NOTE — Assessment & Plan Note (Signed)
During today's visit, Jody Mcclure mentioned that she thinks she is developing arthritis in her hands. Describes that her hands frequently hurt, especially towards the ends of the day. Also described that opening cans has increasingly become more difficult.   On exam, there is tenderness over both thenar eminences with both Phalen and Tinel signs positive, most likely representing carpal tunnel. Will plan for nightly braces, consider orthopedic/sports medicine referral if persists.   - Nightly hand braces - Surgery referral if persists

## 2021-12-29 NOTE — Progress Notes (Signed)
Internal Medicine Clinic Attending  Case and documentation of Dr. Braswell reviewed.  I reviewed the AWV findings.  I agree with the assessment, diagnosis, and plan of care documented in the AWV note.     

## 2021-12-29 NOTE — Assessment & Plan Note (Addendum)
Jody Mcclure reports that she continues to have a chronic cough that has started to affect her quality of life. Mentions that the cough is typically dry and is not associated with a certain time of day. Her breathing is "so-so," mentions she can walk ~1-2 blocks before becoming short of breath. Also mentions dyspnea when coming from the car to the clinic. In addition, she has been using her albuterol nebulizer most days. She has been compliant with her daily Stiolto.   At this juncture I do believe Jody Mcclure would most benefit from up-titrating to triple therapy. Although she has not had a recent hospitalization, her symptoms appear to be severe enough to warrant this escalation. PFT's three years ago revealed severe COPD w/ FEV1 49% predicted. If symptoms do not improve with this change will plan to refer to pulmonology.  - STOP Stiolto - START daily Trelegy - Albuterol inhaler as needed  - Albuterol nebulizer as needed at home - If symptoms do not improve, consider referral to pulmonology

## 2021-12-29 NOTE — Assessment & Plan Note (Addendum)
-   Flu vaccine given today - Prevnar 20 given at age 66. Discussed with pt starting PPVS23 series vs waiting 61yr for another PCV20. She has elected to wait for the next PCV20, due 2027.  - Mammogram ordered today

## 2021-12-29 NOTE — Assessment & Plan Note (Signed)
BP Readings from Last 3 Encounters:  12/27/21 126/72  08/23/21 (!) 154/91  10/19/20 140/65   Blood pressure remains at goal. Denies chest pain, dyspnea, lightheadedness. Will continue with current regimen.  - Amlodipine 5mg  daily

## 2021-12-29 NOTE — Assessment & Plan Note (Signed)
Jody Mcclure reports cramps that occur in both of her bilateral lower extremities, especially in the evening times. Cramps occur in both legs equally and have been occurring for a few months now.  Also reports that they frequently cramp when she walks and continue when she stops. No recent change in activity level or travel.   On exam, she has good lower extremity pulses. Will obtain electrolytes, iron panel for further evaluation.   - Follow-up BMP, Mg, iron panel

## 2021-12-31 MED ORDER — PRAVASTATIN SODIUM 80 MG PO TABS: 80.0000 mg | ORAL_TABLET | Freq: Every evening | ORAL | 2 refills | Status: DC

## 2021-12-31 NOTE — Addendum Note (Signed)
Addended bySanjuan Dame on: 12/31/2021 03:28 PM   Modules accepted: Orders

## 2022-01-04 NOTE — Progress Notes (Signed)
Internal Medicine Clinic Attending ? ?Case discussed with Dr. Braswell  at the time of the visit.  We reviewed the resident?s history and exam and pertinent patient test results.  I agree with the assessment, diagnosis, and plan of care documented in the resident?s note.  ?

## 2022-01-27 ENCOUNTER — Telehealth (HOSPITAL_BASED_OUTPATIENT_CLINIC_OR_DEPARTMENT_OTHER): Payer: Self-pay

## 2022-03-20 ENCOUNTER — Other Ambulatory Visit: Payer: Self-pay | Admitting: Student

## 2022-03-20 DIAGNOSIS — J449 Chronic obstructive pulmonary disease, unspecified: Secondary | ICD-10-CM

## 2022-05-02 ENCOUNTER — Other Ambulatory Visit: Payer: Self-pay

## 2022-05-02 DIAGNOSIS — E782 Mixed hyperlipidemia: Secondary | ICD-10-CM

## 2022-05-03 ENCOUNTER — Other Ambulatory Visit: Payer: Self-pay

## 2022-05-03 DIAGNOSIS — E782 Mixed hyperlipidemia: Secondary | ICD-10-CM

## 2022-05-03 MED ORDER — PRAVASTATIN SODIUM 80 MG PO TABS: 80.0000 mg | ORAL_TABLET | Freq: Every evening | ORAL | 2 refills | Status: DC

## 2022-05-19 ENCOUNTER — Other Ambulatory Visit: Payer: Self-pay | Admitting: Internal Medicine

## 2022-05-19 DIAGNOSIS — F411 Generalized anxiety disorder: Secondary | ICD-10-CM

## 2022-05-30 ENCOUNTER — Ambulatory Visit (INDEPENDENT_AMBULATORY_CARE_PROVIDER_SITE_OTHER): Payer: 59

## 2022-05-30 ENCOUNTER — Other Ambulatory Visit: Payer: Self-pay

## 2022-05-30 VITALS — BP 158/85 | HR 75 | Temp 98.4°F | Ht 60.0 in | Wt 133.2 lb

## 2022-05-30 DIAGNOSIS — L249 Irritant contact dermatitis, unspecified cause: Secondary | ICD-10-CM | POA: Diagnosis not present

## 2022-05-30 DIAGNOSIS — L259 Unspecified contact dermatitis, unspecified cause: Secondary | ICD-10-CM | POA: Insufficient documentation

## 2022-05-30 MED ORDER — HYDROCORTISONE 2.5 % EX LOTN: TOPICAL_LOTION | CUTANEOUS | 1 refills | Status: DC

## 2022-05-30 NOTE — Patient Instructions (Signed)
Thank you, [name] for allowing [place] to provide your care today. Today we discussed:  Rash: Your rash appears to be an irritant dermatitis from something that contacted the skin. We will give you a topical steroid lotion to try a few times per day on the area. Call back on Thursday if there is no improvement and we will look into other causes of this.     I have ordered the following medication/changed the following medications:   Stop the following medications: There are no discontinued medications.   Start the following medications: Meds ordered this encounter  Medications   hydrocortisone 2.5 % lotion    Sig: Apply to affected area 2 times daily    Dispense:  118 mL    Refill:  1     Follow up: In 1 week if no resolution     We look forward to seeing you next time. Please call our clinic at [phone] if you have any questions or concerns. The best time to call is Monday-Friday from 9am-4pm, but there is someone available 24/7. If after hours or the weekend, call the main hospital number and ask for the Internal Medicine Resident On-Call. If you need medication refills, please notify your pharmacy one week in advance and they will send [place] a request.   Thank you for trusting me with your care. Wishing you the best!   Iona Coach, MD [place]

## 2022-05-30 NOTE — Assessment & Plan Note (Signed)
Patient reports with 2 weeks of erythematous papular eruption that started on the forearms and then appeared on the lower back and then the bilateral shins. Only a few days for this to onset and no real worsening or improvement since. Primarily just itching, no pain. No vesicles or drainage. Never had anything like this before. No fever, lethargy, viral symptoms. Lives with a roommate for >20 years and occasionally sleep together, he does not have this. Had bed bugs before, says this is not that. No outdoor exposures. No new detergents or soaps. No new roommates or sexual partners. No new meds. No new supplements. No pets or pet exposures. Did get a new top and pants form a friend and didn't wash it around the time of all of this. Tried some low potency steroid cream given to her in the past without relief. On exam there are numerous blanching raised erythematous <1 cm papules over the dorsal forearms, the lower back, and the bilateral shins. Excoriations noted but no vesicles, scale, drainage. Differential includes bed bugs but wears long pants and long shirt and partner doesn't have this so less likely. Could be scabies but not interdigital and partner doesn't have this. Could be prurigo nodularis but no history of this. Most likely irritant contact dermatitis. -hydrocortisone 2.5% lotion -call on Thursday if no improvement

## 2022-05-30 NOTE — Progress Notes (Signed)
Established Patient Office Visit  Subjective   Patient ID: Jody Mcclure, female    DOB: 03-04-1956  Age: 67 y.o. MRN: [id number]  Chief Complaint  Patient presents with   Rash    Arms, legs, and buttocks x 2 weeks    Jody Mcclure is a 67 y/o female with a pmh outlined below. Please see A&P for HPI information.  Rash      Review of Systems  Skin:  Positive for rash.  All other systems reviewed and are negative.     Objective:     BP (!) 158/85 (BP Location: Right Arm, Patient Position: Sitting, Cuff Size: Small)   Pulse 75   Temp 98.4 F (36.9 C) (Oral)   Ht 5' (1.524 m)   Wt 133 lb 3.2 oz (60.4 kg)   SpO2 98%   BMI 26.01 kg/m    Physical Exam Constitutional:      General: She is not in acute distress.    Appearance: Normal appearance. She is normal weight.  HENT:     Head: Normocephalic and atraumatic.  Eyes:     General: No scleral icterus.    Extraocular Movements: Extraocular movements intact.     Conjunctiva/sclera: Conjunctivae normal.  Pulmonary:     Effort: Pulmonary effort is normal. No respiratory distress.  Musculoskeletal:     Right lower leg: No edema.     Left lower leg: No edema.  Skin:    General: Skin is warm and dry.     Comments: On exam there are numerous randomly grouped blanching raised erythematous <1 cm papules over the dorsal forearms, the lower back, and the bilateral shins. Excoriations noted but no vesicles, scale, drainage.  Neurological:     Mental Status: She is alert.  Psychiatric:        Mood and Affect: Mood normal.        Behavior: Behavior normal.      No results found for any visits on 05/30/22.    The 10-year ASCVD risk score (Arnett DK, et al., 2019) is: 13.9%    Assessment & Plan:   Problem List Items Addressed This Visit       Musculoskeletal and Integument   Contact dermatitis - Primary    Patient reports with 2 weeks of erythematous papular eruption that started on the forearms and then appeared on  the lower back and then the bilateral shins. Only a few days for this to onset and no real worsening or improvement since. Primarily just itching, no pain. No vesicles or drainage. Never had anything like this before. No fever, lethargy, viral symptoms. Lives with a roommate for >20 years and occasionally sleep together, he does not have this. Had bed bugs before, says this is not that. No outdoor exposures. No new detergents or soaps. No new roommates or sexual partners. No new meds. No new supplements. No pets or pet exposures. Did get a new top and pants form a friend and didn't wash it around the time of all of this. Tried some low potency steroid cream given to her in the past without relief. On exam there are numerous blanching raised erythematous <1 cm papules over the dorsal forearms, the lower back, and the bilateral shins. Excoriations noted but no vesicles, scale, drainage. Differential includes bed bugs but wears long pants and long shirt and partner doesn't have this so less likely. Could be scabies but not interdigital and partner doesn't have this. Could be prurigo nodularis but no  history of this. Most likely irritant contact dermatitis. -hydrocortisone 2.5% lotion -call on Thursday if no improvement      Relevant Medications   hydrocortisone 2.5 % lotion    No follow-ups on file.    Iona Coach, MD

## 2022-05-30 NOTE — Progress Notes (Deleted)
Patient reports she continues to have itchy rash on legs. States she often scratches them and they end up bleeding. Mentions she takes three hydroxyzine tablets at night with her zoloft.   A/P: -Discussed with patient hydroxyzine might help more if taken throughout the day. Discussed possible side effect of increased drowsiness and confusion if taking all three at once. -Continue hydroxyzine three times per day as needed.  HTN Amlodipine 5 mg  COPD Trelegy  Anxiety Sertraline 164m daily  Triglycerides 575 12/2019  Blanching + papular = not petechiae or palpable purpura

## 2022-05-30 NOTE — Progress Notes (Signed)
Internal Medicine Clinic Attending  I saw and evaluated the patient.  I personally confirmed the key portions of the history and exam documented by Dr. Rogers and I reviewed pertinent patient test results.  The assessment, diagnosis, and plan were formulated together and I agree with the documentation in the resident's note.  

## 2022-06-09 ENCOUNTER — Encounter: Payer: 59 | Admitting: Student

## 2022-06-16 ENCOUNTER — Encounter: Payer: 59 | Admitting: Student

## 2022-06-28 ENCOUNTER — Other Ambulatory Visit: Payer: Self-pay | Admitting: Student

## 2022-06-28 DIAGNOSIS — N393 Stress incontinence (female) (male): Secondary | ICD-10-CM

## 2022-06-30 ENCOUNTER — Other Ambulatory Visit: Payer: Self-pay | Admitting: Student

## 2022-06-30 ENCOUNTER — Ambulatory Visit (INDEPENDENT_AMBULATORY_CARE_PROVIDER_SITE_OTHER): Payer: 59 | Admitting: Student

## 2022-06-30 DIAGNOSIS — L249 Irritant contact dermatitis, unspecified cause: Secondary | ICD-10-CM

## 2022-06-30 DIAGNOSIS — J449 Chronic obstructive pulmonary disease, unspecified: Secondary | ICD-10-CM

## 2022-06-30 MED ORDER — HYDROCORTISONE 2.5 % EX LOTN: TOPICAL_LOTION | CUTANEOUS | 0 refills | Status: DC

## 2022-06-30 NOTE — Progress Notes (Signed)
  [facility] Internal Medicine Residency Telephone Encounter Continuity Care Appointment  HPI:  This telephone encounter was created for [name] on 06/30/2022 for the following purpose/cc: Discussion about incontinence supplies   Patient has been experiencing incontinence for 2-3 years. Describes urinating frequently at night and dribbling throughout the day. Leaks with coughing, sneezing, laughing, and lifting. Denies sudden urge to urinate, loss of control over bladder, incomplete emptying sensation, and constipation. Drinks caffeined soda at least once per day. History notable for hysterectomy many years ago in her 37s. Only functional limitation is COPD, she ambulates without assistance or difficulty. Overall presentation consistent with stress incontinence and possible overflow component. Low concern for urge or functional incontinence. Disposable Depends undergarments are providing patient with satisfactory symptom management. Further diagnostic workup with urology referral and postvoid residual test was offered, patient declined.      Past Medical History:  Past Medical History:  Diagnosis Date   Anxiety    Depression    Hyperlipidemia    Hypertension      ROS:  Reports urinating frequently at night and dribbling throughout the day, leaks with coughing and sneezing Denies sudden urge to urinate, loss of control over bladder, incomplete emptying sensation, constipation   Assessment / Plan / Recommendations:  Please see A&P under problem oriented charting for assessment of the patient's acute and chronic medical conditions.  As always, pt is advised that if symptoms worsen or new symptoms arise, they should go to an urgent care facility or to to ER for further evaluation.  Overall presentation consistent with stress incontinence and possible overflow component. Low concern for urge or functional incontinence. Disposable Depends undergarments are providing patient with  satisfactory symptom management. Further diagnostic workup with urology referral and postvoid residual test was offered, patient declined.   - Continue disposable Depends undergarments - Return to [facility] in about one month     Consent and Medical Decision Making:  Patient discussed with Dr.  Cain Sieve This is a telephone encounter between Jody Mcclure and Jody Mcclure on 06/30/2022 for urinary incontinence. The visit was conducted with the patient located at home and Jody Mcclure at [facility] The patient's identity was confirmed using their DOB and current address. The patient has consented to being evaluated through a telephone encounter and understands the associated risks (an examination cannot be done and the patient may need to come in for an appointment) / benefits (allows the patient to remain at home, decreasing exposure to coronavirus). I personally spent 11 minutes on medical discussion.

## 2022-07-15 ENCOUNTER — Encounter: Payer: Self-pay | Admitting: Gastroenterology

## 2022-07-15 NOTE — Progress Notes (Signed)
Internal Medicine Clinic Attending  Case discussed with Dr. Harper  At the time of the visit.  We reviewed the resident's history and exam and pertinent patient test results.  I agree with the assessment, diagnosis, and plan of care documented in the resident's note.  

## 2022-08-01 ENCOUNTER — Other Ambulatory Visit: Payer: Self-pay

## 2022-08-01 MED ORDER — BETAMETHASONE VALERATE 0.1 % EX OINT: TOPICAL_OINTMENT | CUTANEOUS | 3 refills | Status: DC

## 2022-08-05 ENCOUNTER — Other Ambulatory Visit: Payer: Self-pay | Admitting: Student

## 2022-08-05 DIAGNOSIS — I10 Essential (primary) hypertension: Secondary | ICD-10-CM

## 2022-08-29 ENCOUNTER — Other Ambulatory Visit: Payer: Self-pay | Admitting: Student

## 2022-08-29 DIAGNOSIS — E782 Mixed hyperlipidemia: Secondary | ICD-10-CM

## 2022-08-29 NOTE — Telephone Encounter (Signed)
Pt is requesting an 90 day supply  pravastatin (PRAVACHOL) 80 MG tablet  [facility] - [place], [place] - [address]

## 2022-08-30 MED ORDER — PRAVASTATIN SODIUM 80 MG PO TABS: 80.0000 mg | ORAL_TABLET | Freq: Every evening | ORAL | 2 refills | Status: DC

## 2022-09-13 ENCOUNTER — Encounter: Payer: Self-pay | Admitting: *Deleted

## 2022-10-03 ENCOUNTER — Other Ambulatory Visit: Payer: Self-pay | Admitting: Student

## 2022-10-03 DIAGNOSIS — J449 Chronic obstructive pulmonary disease, unspecified: Secondary | ICD-10-CM

## 2022-12-21 ENCOUNTER — Other Ambulatory Visit: Payer: Self-pay | Admitting: *Deleted

## 2022-12-21 DIAGNOSIS — F411 Generalized anxiety disorder: Secondary | ICD-10-CM

## 2022-12-21 MED ORDER — SERTRALINE HCL 100 MG PO TABS: 150.0000 mg | ORAL_TABLET | Freq: Every day | ORAL | 1 refills | Status: DC

## 2023-01-10 ENCOUNTER — Telehealth: Payer: Self-pay | Admitting: Student

## 2023-01-10 NOTE — Telephone Encounter (Signed)
Pt states she was to have all of her prescriptions sent to her new pharmacy for 90 days to the following pharmacy listed below.   Pt  also states she has one prescription she needs now, as she only has one pill left.  TRELEGY ELLIPTA 100-62.5-25 MCG/ACT AEPB   [email or web address] [facility] [address] [place], [place] [address]  4.2 mi [phone]

## 2023-01-11 ENCOUNTER — Other Ambulatory Visit: Payer: Self-pay | Admitting: Student

## 2023-01-11 DIAGNOSIS — L249 Irritant contact dermatitis, unspecified cause: Secondary | ICD-10-CM

## 2023-01-11 DIAGNOSIS — F411 Generalized anxiety disorder: Secondary | ICD-10-CM

## 2023-01-11 DIAGNOSIS — E782 Mixed hyperlipidemia: Secondary | ICD-10-CM

## 2023-01-11 DIAGNOSIS — M159 Polyosteoarthritis, unspecified: Secondary | ICD-10-CM

## 2023-01-11 DIAGNOSIS — R0609 Other forms of dyspnea: Secondary | ICD-10-CM

## 2023-01-11 DIAGNOSIS — J449 Chronic obstructive pulmonary disease, unspecified: Secondary | ICD-10-CM

## 2023-01-11 DIAGNOSIS — I10 Essential (primary) hypertension: Secondary | ICD-10-CM

## 2023-01-12 ENCOUNTER — Other Ambulatory Visit: Payer: Self-pay | Admitting: Student

## 2023-01-12 DIAGNOSIS — I10 Essential (primary) hypertension: Secondary | ICD-10-CM

## 2023-01-12 DIAGNOSIS — E782 Mixed hyperlipidemia: Secondary | ICD-10-CM

## 2023-01-12 DIAGNOSIS — M159 Polyosteoarthritis, unspecified: Secondary | ICD-10-CM

## 2023-01-12 DIAGNOSIS — J449 Chronic obstructive pulmonary disease, unspecified: Secondary | ICD-10-CM

## 2023-01-12 DIAGNOSIS — F411 Generalized anxiety disorder: Secondary | ICD-10-CM

## 2023-01-12 MED ORDER — ALBUTEROL SULFATE HFA 108 (90 BASE) MCG/ACT IN AERS: 2.0000 | INHALATION_SPRAY | Freq: Four times a day (QID) | RESPIRATORY_TRACT | 2 refills | Status: DC | PRN

## 2023-01-12 MED ORDER — AMLODIPINE BESYLATE 5 MG PO TABS
5.0000 mg | ORAL_TABLET | Freq: Every day | ORAL | 2 refills | Status: DC
Start: 2023-01-12 — End: 2023-07-19

## 2023-01-12 MED ORDER — BETAMETHASONE VALERATE 0.1 % EX OINT: TOPICAL_OINTMENT | CUTANEOUS | 3 refills | Status: DC

## 2023-01-12 MED ORDER — HYDROXYZINE HCL 25 MG PO TABS
25.0000 mg | ORAL_TABLET | Freq: Three times a day (TID) | ORAL | 2 refills | Status: DC | PRN
Start: 2023-01-12 — End: 2023-07-10

## 2023-01-12 MED ORDER — SERTRALINE HCL 100 MG PO TABS
150.0000 mg | ORAL_TABLET | Freq: Every day | ORAL | 1 refills | Status: DC
Start: 2023-01-12 — End: 2023-10-31

## 2023-01-12 MED ORDER — TRELEGY ELLIPTA 100-62.5-25 MCG/ACT IN AEPB
1.0000 | INHALATION_SPRAY | Freq: Every day | RESPIRATORY_TRACT | 2 refills | Status: DC
Start: 2023-01-12 — End: 2023-04-18

## 2023-01-12 MED ORDER — DEPEND EASY FIT UNDERGARMENTS MISC: 1.0000 [IU] | Freq: Three times a day (TID) | 11 refills | Status: DC

## 2023-01-12 MED ORDER — DICLOFENAC SODIUM 1 % EX GEL
2.0000 g | Freq: Four times a day (QID) | CUTANEOUS | 0 refills | Status: DC
Start: 2023-01-12 — End: 2023-05-15

## 2023-01-12 MED ORDER — PRAVASTATIN SODIUM 80 MG PO TABS
80.0000 mg | ORAL_TABLET | Freq: Every evening | ORAL | 2 refills | Status: DC
Start: 2023-01-12 — End: 2023-08-09

## 2023-01-13 ENCOUNTER — Encounter: Payer: Self-pay | Admitting: Student

## 2023-01-19 ENCOUNTER — Ambulatory Visit: Payer: 59

## 2023-01-19 DIAGNOSIS — Z0000 Encounter for general adult medical examination without abnormal findings: Secondary | ICD-10-CM

## 2023-01-19 NOTE — Progress Notes (Signed)
Subjective:   Jody Mcclure is a 67 y.o. female who presents for Medicare Annual (Subsequent) preventive examination.  Visit Complete: Virtual I connected with  [name] on 01/19/23 by a audio enabled telemedicine application and verified that I am speaking with the correct person using two identifiers.  Patient Location: Home  Provider Location: Home Office  I discussed the limitations of evaluation and management by telemedicine. The patient expressed understanding and agreed to proceed.  Vital Signs: Because this visit was a virtual/telehealth visit, some criteria may be missing or patient reported. Any vitals not documented were not able to be obtained and vitals that have been documented are patient reported.   Cardiac Risk Factors include: advanced age (>83men, >81 women);hypertension     Objective:    Today's Vitals   01/19/23 1552  PainSc: 5    There is no height or weight on file to calculate BMI.     01/19/2023    4:00 PM 05/30/2022    2:55 PM 12/27/2021    3:43 PM 12/27/2021    3:01 PM 08/23/2021    2:07 PM 10/19/2020    2:14 PM 08/17/2020    2:20 PM  Advanced Directives  Does Patient Have a Medical Advance Directive? No No No No No No No  Would patient like information on creating a medical advance directive? No - Patient declined No - Patient declined No - Patient declined No - Patient declined No - Patient declined  No - Patient declined    Current Medications (verified) Outpatient Encounter Medications as of 01/19/2023  Medication Sig   albuterol (VENTOLIN HFA) 108 (90 Base) MCG/ACT inhaler Inhale 2 puffs into the lungs every 6 (six) hours as needed for wheezing or shortness of breath.   amLODipine (NORVASC) 5 MG tablet Take 1 tablet (5 mg total) by mouth daily.   betamethasone valerate ointment (VALISONE) 0.1 % APPLY TO AFFECTED AREA TWICE A DAY   diclofenac Sodium (VOLTAREN) 1 % GEL Apply 2 g topically 4 (four) times daily.   Fluticasone-Umeclidin-Vilant  (TRELEGY ELLIPTA) 100-62.5-25 MCG/ACT AEPB Inhale 1 puff into the lungs daily for 90 doses.   hydrocortisone 2.5 % lotion Apply to affected area 2 times daily   hydrOXYzine (ATARAX) 25 MG tablet Take 1 tablet (25 mg total) by mouth 3 (three) times daily as needed.   Incontinence Supply Disposable (DEPEND EASY FIT UNDERGARMENTS) MISC 1 Units by Does not apply route 3 (three) times daily.   pravastatin (PRAVACHOL) 80 MG tablet Take 1 tablet (80 mg total) by mouth every evening.   sertraline (ZOLOFT) 100 MG tablet Take 1.5 tablets (150 mg total) by mouth daily.   albuterol (PROVENTIL) (2.5 MG/3ML) 0.083% nebulizer solution Take 3 mLs (2.5 mg total) by nebulization 2 (two) times daily as needed for wheezing or shortness of breath.   [DISCONTINUED] omeprazole (PRILOSEC OTC) 20 MG tablet Take 1 tablet (20 mg total) by mouth daily.   No facility-administered encounter medications on file as of 01/19/2023.    Allergies (verified) Codeine and Sulfa antibiotics   History: Past Medical History:  Diagnosis Date   Anxiety    Depression    Hyperlipidemia    Hypertension    Past Surgical History:  Procedure Laterality Date   ABDOMINAL HYSTERECTOMY     TONSILLECTOMY     Family History  Problem Relation Age of Onset   Cancer Mother        lung   Hyperlipidemia Mother    Hypertension Father  Hyperlipidemia Father    Colon cancer Neg Hx    Colon polyps Neg Hx    Rectal cancer Neg Hx    Stomach cancer Neg Hx    Social History   Socioeconomic History   Marital status: Single    Spouse name: Not on file   Number of children: 2   Years of education: Not on file   Highest education level: Not on file  Occupational History   Occupation: Unemployed   Tobacco Use   Smoking status: Former    Current packs/day: 0.00    Types: Cigarettes    Quit date: 01/03/2018    Years since quitting: 5.0   Smokeless tobacco: Never   Tobacco comments:    1 puff occassionally  Vaping Use   Vaping  status: Never Used  Substance and Sexual Activity   Alcohol use: Yes    Alcohol/week: 2.0 standard drinks of alcohol    Types: 2 Cans of beer per week    Comment: 1 daily   Drug use: Not Currently    Types: Cocaine   Sexual activity: Yes  Other Topics Concern   Not on file  Social History Narrative   Daily caffeine    Social Determinants of Health   Financial Resource Strain: Low Risk  (01/19/2023)   Overall Financial Resource Strain (CARDIA)    Difficulty of Paying Living Expenses: Not hard at all  Food Insecurity: No Food Insecurity (01/19/2023)   Hunger Vital Sign    Worried About Running Out of Food in the Last Year: Never true    Ran Out of Food in the Last Year: Never true  Transportation Needs: No Transportation Needs (01/19/2023)   PRAPARE - Administrator, Civil Service (Medical): No    Lack of Transportation (Non-Medical): No  Physical Activity: Sufficiently Active (01/19/2023)   Exercise Vital Sign    Days of Exercise per Week: 5 days    Minutes of Exercise per Session: 50 min  Stress: No Stress Concern Present (01/19/2023)   [employer] of Occupational Health - Occupational Stress Questionnaire    Feeling of Stress : Not at all  Social Connections: Unknown (01/19/2023)   Social Connection and Isolation Panel [NHANES]    Frequency of Communication with Friends and Family: More than three times a week    Frequency of Social Gatherings with Friends and Family: Three times a week    Attends Religious Services: Never    Active Member of Clubs or Organizations: No    Attends Engineer, structural: Never    Marital Status: Not on file    Tobacco Counseling Counseling given: Not Answered Tobacco comments: 1 puff occassionally   Clinical Intake:  Pre-visit preparation completed: Yes  Pain : 0-10 Pain Score: 5  Pain Type: Chronic pain Pain Location: Knee Pain Orientation: Left Pain Descriptors / Indicators: Burning, Aching,  Grimacing, Dull Pain Onset: 1 to 4 weeks ago Pain Frequency: Intermittent     Diabetes: No  How often do you need to have someone help you when you read instructions, pamphlets, or other written materials from your doctor or pharmacy?: 1 - Never  Interpreter Needed?: No  Information entered by :: Remi Haggard LPN   Activities of Daily Living    01/19/2023    3:58 PM 05/30/2022    2:54 PM  In your present state of health, do you have any difficulty performing the following activities:  Hearing? 1 0  Vision? 0 0  Difficulty  concentrating or making decisions? 0 1  Comment  dementia  Walking or climbing stairs? 0 0  Dressing or bathing? 0 0  Doing errands, shopping? 0 0  Preparing Food and eating ? N   Using the Toilet? N   In the past six months, have you accidently leaked urine? Y   Do you have problems with loss of bowel control? N   Managing your Medications? N   Managing your Finances? N   Housekeeping or managing your Housekeeping? N     Patient Care Team: Philomena Doheny, MD as PCP - General Zettie Pho, [facility] (Inactive) as Pharmacist (Pharmacist) Shaune Leeks as Social Worker  Indicate any recent Medical Services you may have received from other than [facility] providers in the past year (date may be approximate).     Assessment:   This is a routine wellness examination for [place].  Hearing/Vision screen Hearing Screening - Comments:: Does not wear hearing aids  Vision Screening - Comments:: Not up date   Goals Addressed             This Visit's Progress    Patient Stated       Continue current lifestyle       Depression Screen    01/19/2023    4:03 PM 05/30/2022    3:44 PM 12/27/2021    3:43 PM 12/27/2021    2:58 PM 08/23/2021    2:07 PM 10/19/2020    2:13 PM 08/17/2020    2:21 PM  PHQ 2/9 Scores  PHQ - 2 Score 2 1 0 0 1 0 1  PHQ- 9 Score 7 6    0     Fall Risk    01/19/2023    3:57 PM 05/30/2022    2:54 PM 12/27/2021    3:44  PM 12/27/2021    2:58 PM 08/23/2021    2:07 PM  Fall Risk   Falls in the past year? 0 0 0 0 0  Number falls in past yr: 0 0 0 0   Injury with Fall? 0 0 0 0   Risk for fall due to :  No Fall Risks No Fall Risks No Fall Risks No Fall Risks  Follow up Falls evaluation completed;Education provided;Falls prevention discussed Falls evaluation completed Falls prevention discussed;Falls evaluation completed Falls evaluation completed;Falls prevention discussed Falls evaluation completed    MEDICARE RISK AT HOME: Medicare Risk at Home Any stairs in or around the home?: No If so, are there any without handrails?: No Home free of loose throw rugs in walkways, pet beds, electrical cords, etc?: Yes Adequate lighting in your home to reduce risk of falls?: Yes Life alert?: No Use of a cane, walker or w/c?: No Grab bars in the bathroom?: Yes Shower chair or bench in shower?: Yes Elevated toilet seat or a handicapped toilet?: No  TIMED UP AND GO:  Was the test performed?  No    Cognitive Function:        01/19/2023    4:00 PM 12/27/2021    3:45 PM  6CIT Screen  What Year? 0 points 0 points  What month? 0 points 0 points  What time? 0 points 0 points  Count back from 20 0 points 0 points  Months in reverse 0 points 0 points  Repeat phrase 0 points 0 points  Total Score 0 points 0 points    Immunizations Immunization History  Administered Date(s) Administered   Fluad Quad(high Dose 65+) 12/27/2021  Influenza Inj Mdck Quad Pf 02/09/2018   Influenza,inj,Quad PF,6+ Mos 06/03/2016, 12/30/2019, 01/18/2021   PFIZER(Purple Top)SARS-COV-2 Vaccination 07/05/2019, 07/26/2019, 04/11/2020   PNEUMOCOCCAL CONJUGATE-20 10/19/2020   Tdap 09/06/2011, 10/07/2014, 07/06/2017    TDAP status: Up to date  Flu Vaccine status: Up to date  Pneumococcal vaccine status: Up to date  Covid-19 vaccine status: Completed vaccines  Qualifies for Shingles Vaccine? No   Zostavax completed Yes   Shingrix  Completed?: Yes  Screening Tests Health Maintenance  Topic Date Due   Zoster Vaccines- Shingrix (1 of 2) Never done   MAMMOGRAM  07/29/2009   DEXA SCAN  Never done   Colonoscopy  06/06/2022   INFLUENZA VACCINE  11/10/2022   COVID-19 Vaccine (4 - 2023-24 season) 12/11/2022   Medicare Annual Wellness (AWV)  01/19/2024   DTaP/Tdap/Td (4 - Td or Tdap) 07/07/2027   Pneumonia Vaccine 17+ Years old  Completed   Hepatitis C Screening  Completed   HPV VACCINES  Aged Out    Health Maintenance  Health Maintenance Due  Topic Date Due   Zoster Vaccines- Shingrix (1 of 2) Never done   MAMMOGRAM  07/29/2009   DEXA SCAN  Never done   Colonoscopy  06/06/2022   INFLUENZA VACCINE  11/10/2022   COVID-19 Vaccine (4 - 2023-24 season) 12/11/2022    Colorectal cancer screening: Type of screening: Colonoscopy. Completed 2014. Repeat every 10 years Patient will like order  Mammogram  2009 Due   Bone Density   Never done  Lung Cancer Screening: (Low Dose CT Chest recommended if Age 33-80 years, 20 pack-year currently smoking OR have quit w/in 15years.) does not qualify.   Lung Cancer Screening Referral:   Additional Screening:  Hepatitis C Screening: does not qualify; Completed 2020  Vision Screening: Recommended annual ophthalmology exams for early detection of glaucoma and other disorders of the eye. Is the patient up to date with their annual eye exam?  No  Who is the provider or what is the name of the office in which the patient attends annual eye exams? Education provided with names of optometrist If pt is not established with a provider, would they like to be referred to a provider to establish care? No .   Dental Screening: Recommended annual dental exams for proper oral hygiene    Community Resource Referral / Chronic Care Management: CRR required this visit?  No   CCM required this visit?  No     Plan:     I have personally reviewed and noted the following in the  patient's chart:   Medical and social history Use of alcohol, tobacco or illicit drugs  Current medications and supplements including opioid prescriptions. Patient is not currently taking opioid prescriptions. Functional ability and status Nutritional status Physical activity Advanced directives List of other physicians Hospitalizations, surgeries, and ER visits in previous 12 months Vitals Screenings to include cognitive, depression, and falls Referrals and appointments  In addition, I have reviewed and discussed with patient certain preventive protocols, quality metrics, and best practice recommendations. A written personalized care plan for preventive services as well as general preventive health recommendations were provided to patient.     Remi Haggard, LPN   [id number]   After Visit Summary: (MyChart) Due to this being a telephonic visit, the after visit summary with patients personalized plan was offered to patient via MyChart   Nurse Notes:

## 2023-01-19 NOTE — Patient Instructions (Signed)
Jody Mcclure , Thank you for taking time to come for your Medicare Wellness Visit. I appreciate your ongoing commitment to your health goals. Please review the following plan we discussed and let me know if I can assist you in the future.   Screening recommendations/referrals: Colonoscopy: Education provided Mammogram: Education provided Bone Density: Education provided Recommended yearly ophthalmology/optometry visit for glaucoma screening and checkup Recommended yearly dental visit for hygiene and checkup  Vaccinations: Influenza vaccine: up to date Pneumococcal vaccine: up to date Tdap vaccine: up to date Shingles vaccine: up to date    Advanced directives: Education provided      Preventive Care 65 Years and Older, Female Preventive care refers to lifestyle choices and visits with your health care provider that can promote health and wellness. What does preventive care include? A yearly physical exam. This is also called an annual well check. Dental exams once or twice a year. Routine eye exams. Ask your health care provider how often you should have your eyes checked. Personal lifestyle choices, including: Daily care of your teeth and gums. Regular physical activity. Eating a healthy diet. Avoiding tobacco and drug use. Limiting alcohol use. Practicing safe sex. Taking low-dose aspirin every day. Taking vitamin and mineral supplements as recommended by your health care provider. What happens during an annual well check? The services and screenings done by your health care provider during your annual well check will depend on your age, overall health, lifestyle risk factors, and family history of disease. Counseling  Your health care provider may ask you questions about your: Alcohol use. Tobacco use. Drug use. Emotional well-being. Home and relationship well-being. Sexual activity. Eating habits. History of falls. Memory and ability to understand (cognition). Work  and work Astronomer. Reproductive health. Screening  You may have the following tests or measurements: Height, weight, and BMI. Blood pressure. Lipid and cholesterol levels. These may be checked every 5 years, or more frequently if you are over 62 years old. Skin check. Lung cancer screening. You may have this screening every year starting at age 58 if you have a 30-pack-year history of smoking and currently smoke or have quit within the past 15 years. Fecal occult blood test (FOBT) of the stool. You may have this test every year starting at age 63. Flexible sigmoidoscopy or colonoscopy. You may have a sigmoidoscopy every 5 years or a colonoscopy every 10 years starting at age 83. Hepatitis C blood test. Hepatitis B blood test. Sexually transmitted disease (STD) testing. Diabetes screening. This is done by checking your blood sugar (glucose) after you have not eaten for a while (fasting). You may have this done every 1-3 years. Bone density scan. This is done to screen for osteoporosis. You may have this done starting at age 70. Mammogram. This may be done every 1-2 years. Talk to your health care provider about how often you should have regular mammograms. Talk with your health care provider about your test results, treatment options, and if necessary, the need for more tests. Vaccines  Your health care provider may recommend certain vaccines, such as: Influenza vaccine. This is recommended every year. Tetanus, diphtheria, and acellular pertussis (Tdap, Td) vaccine. You may need a Td booster every 10 years. Zoster vaccine. You may need this after age 70. Pneumococcal 13-valent conjugate (PCV13) vaccine. One dose is recommended after age 12. Pneumococcal polysaccharide (PPSV23) vaccine. One dose is recommended after age 45. Talk to your health care provider about which screenings and vaccines you need and how often  you need them. This information is not intended to replace advice given to  you by your health care provider. Make sure you discuss any questions you have with your health care provider. Document Released: 04/24/2015 Document Revised: 12/16/2015 Document Reviewed: 01/27/2015 Elsevier Interactive Patient Education  2017 [employer].  Fall Prevention in the Home Falls can cause injuries. They can happen to people of all ages. There are many things you can do to make your home safe and to help prevent falls. What can I do on the outside of my home? Regularly fix the edges of walkways and driveways and fix any cracks. Remove anything that might make you trip as you walk through a door, such as a raised step or threshold. Trim any bushes or trees on the path to your home. Use bright outdoor lighting. Clear any walking paths of anything that might make someone trip, such as rocks or tools. Regularly check to see if handrails are loose or broken. Make sure that both sides of any steps have handrails. Any raised decks and porches should have guardrails on the edges. Have any leaves, snow, or ice cleared regularly. Use sand or salt on walking paths during winter. Clean up any spills in your garage right away. This includes oil or grease spills. What can I do in the bathroom? Use night lights. Install grab bars by the toilet and in the tub and shower. Do not use towel bars as grab bars. Use non-skid mats or decals in the tub or shower. If you need to sit down in the shower, use a plastic, non-slip stool. Keep the floor dry. Clean up any water that spills on the floor as soon as it happens. Remove soap buildup in the tub or shower regularly. Attach bath mats securely with double-sided non-slip rug tape. Do not have throw rugs and other things on the floor that can make you trip. What can I do in the bedroom? Use night lights. Make sure that you have a light by your bed that is easy to reach. Do not use any sheets or blankets that are too big for your bed. They should not  hang down onto the floor. Have a firm chair that has side arms. You can use this for support while you get dressed. Do not have throw rugs and other things on the floor that can make you trip. What can I do in the kitchen? Clean up any spills right away. Avoid walking on wet floors. Keep items that you use a lot in easy-to-reach places. If you need to reach something above you, use a strong step stool that has a grab bar. Keep electrical cords out of the way. Do not use floor polish or wax that makes floors slippery. If you must use wax, use non-skid floor wax. Do not have throw rugs and other things on the floor that can make you trip. What can I do with my stairs? Do not leave any items on the stairs. Make sure that there are handrails on both sides of the stairs and use them. Fix handrails that are broken or loose. Make sure that handrails are as long as the stairways. Check any carpeting to make sure that it is firmly attached to the stairs. Fix any carpet that is loose or worn. Avoid having throw rugs at the top or bottom of the stairs. If you do have throw rugs, attach them to the floor with carpet tape. Make sure that you have a light  switch at the top of the stairs and the bottom of the stairs. If you do not have them, ask someone to add them for you. What else can I do to help prevent falls? Wear shoes that: Do not have high heels. Have rubber bottoms. Are comfortable and fit you well. Are closed at the toe. Do not wear sandals. If you use a stepladder: Make sure that it is fully opened. Do not climb a closed stepladder. Make sure that both sides of the stepladder are locked into place. Ask someone to hold it for you, if possible. Clearly mark and make sure that you can see: Any grab bars or handrails. First and last steps. Where the edge of each step is. Use tools that help you move around (mobility aids) if they are needed. These  include: Canes. Walkers. Scooters. Crutches. Turn on the lights when you go into a dark area. Replace any light bulbs as soon as they burn out. Set up your furniture so you have a clear path. Avoid moving your furniture around. If any of your floors are uneven, fix them. If there are any pets around you, be aware of where they are. Review your medicines with your doctor. Some medicines can make you feel dizzy. This can increase your chance of falling. Ask your doctor what other things that you can do to help prevent falls. This information is not intended to replace advice given to you by your health care provider. Make sure you discuss any questions you have with your health care provider. Document Released: 01/22/2009 Document Revised: 09/03/2015 Document Reviewed: 05/02/2014 Elsevier Interactive Patient Education  2017 [employer].

## 2023-02-09 NOTE — Progress Notes (Signed)
I reviewed the AWV findings with the provider who conducted the visit. I was present in the office suite and immediately available to provide assistance and direction throughout the time the service was provided.  

## 2023-02-21 ENCOUNTER — Encounter: Payer: 59 | Admitting: Student

## 2023-03-17 ENCOUNTER — Other Ambulatory Visit: Payer: Self-pay | Admitting: Internal Medicine

## 2023-03-17 DIAGNOSIS — J449 Chronic obstructive pulmonary disease, unspecified: Secondary | ICD-10-CM

## 2023-03-17 NOTE — Telephone Encounter (Signed)
Next appt scheduled 12/13 with Dr Ned Card.

## 2023-03-20 NOTE — Telephone Encounter (Signed)
Called pt - refill not needed; she will call the pharmacy since there are still refills on current rx.

## 2023-03-24 ENCOUNTER — Encounter: Payer: 59 | Admitting: Internal Medicine

## 2023-03-24 NOTE — Progress Notes (Unsigned)
CC: ***  HPI:  Jody Mcclure is a 67 y.o. female living with a history stated below and presents today for ***. Please see problem based assessment and plan for additional details.  Past Medical History:  Diagnosis Date   Anxiety    Depression    Hyperlipidemia    Hypertension     Current Outpatient Medications on File Prior to Visit  Medication Sig Dispense Refill   albuterol (PROVENTIL) (2.5 MG/3ML) 0.083% nebulizer solution Take 3 mLs (2.5 mg total) by nebulization 2 (two) times daily as needed for wheezing or shortness of breath. 75 mL 0   albuterol (VENTOLIN HFA) 108 (90 Base) MCG/ACT inhaler Inhale 2 puffs into the lungs every 6 (six) hours as needed for wheezing or shortness of breath. 8 g 2   amLODipine (NORVASC) 5 MG tablet Take 1 tablet (5 mg total) by mouth daily. 90 tablet 2   betamethasone valerate ointment (VALISONE) 0.1 % APPLY TO AFFECTED AREA TWICE A DAY 60 g 3   diclofenac Sodium (VOLTAREN) 1 % GEL Apply 2 g topically 4 (four) times daily. 100 g 0   Fluticasone-Umeclidin-Vilant (TRELEGY ELLIPTA) 100-62.5-25 MCG/ACT AEPB Inhale 1 puff into the lungs daily for 90 doses. 60 each 2   hydrocortisone 2.5 % lotion Apply to affected area 2 times daily 59 mL 0   hydrOXYzine (ATARAX) 25 MG tablet Take 1 tablet (25 mg total) by mouth 3 (three) times daily as needed. 270 tablet 2   Incontinence Supply Disposable (DEPEND EASY FIT UNDERGARMENTS) MISC 1 Units by Does not apply route 3 (three) times daily. 100 each 11   pravastatin (PRAVACHOL) 80 MG tablet Take 1 tablet (80 mg total) by mouth every evening. 90 tablet 2   sertraline (ZOLOFT) 100 MG tablet Take 1.5 tablets (150 mg total) by mouth daily. 135 tablet 1   [DISCONTINUED] omeprazole (PRILOSEC OTC) 20 MG tablet Take 1 tablet (20 mg total) by mouth daily. 30 tablet 3   No current facility-administered medications on file prior to visit.    Family History  Problem Relation Age of Onset   Cancer Mother        lung    Hyperlipidemia Mother    Hypertension Father    Hyperlipidemia Father    Colon cancer Neg Hx    Colon polyps Neg Hx    Rectal cancer Neg Hx    Stomach cancer Neg Hx     Social History   Socioeconomic History   Marital status: Single    Spouse name: Not on file   Number of children: 2   Years of education: Not on file   Highest education level: Not on file  Occupational History   Occupation: Unemployed   Tobacco Use   Smoking status: Former    Current packs/day: 0.00    Types: Cigarettes    Quit date: 01/03/2018    Years since quitting: 5.2   Smokeless tobacco: Never   Tobacco comments:    1 puff occassionally  Vaping Use   Vaping status: Never Used  Substance and Sexual Activity   Alcohol use: Yes    Alcohol/week: 2.0 standard drinks of alcohol    Types: 2 Cans of beer per week    Comment: 1 daily   Drug use: Not Currently    Types: Cocaine   Sexual activity: Yes  Other Topics Concern   Not on file  Social History Narrative   Daily caffeine    Social Drivers of Health  Financial Resource Strain: Low Risk  (01/19/2023)   Overall Financial Resource Strain (CARDIA)    Difficulty of Paying Living Expenses: Not hard at all  Food Insecurity: No Food Insecurity (01/19/2023)   Hunger Vital Sign    Worried About Running Out of Food in the Last Year: Never true    Ran Out of Food in the Last Year: Never true  Transportation Needs: No Transportation Needs (01/19/2023)   PRAPARE - Administrator, Civil Service (Medical): No    Lack of Transportation (Non-Medical): No  Physical Activity: Sufficiently Active (01/19/2023)   Exercise Vital Sign    Days of Exercise per Week: 5 days    Minutes of Exercise per Session: 50 min  Stress: No Stress Concern Present (01/19/2023)   [employer] of Occupational Health - Occupational Stress Questionnaire    Feeling of Stress : Not at all  Social Connections: Unknown (01/19/2023)   Social Connection and Isolation  Panel [NHANES]    Frequency of Communication with Friends and Family: More than three times a week    Frequency of Social Gatherings with Friends and Family: Three times a week    Attends Religious Services: Never    Active Member of Clubs or Organizations: No    Attends Banker Meetings: Never    Marital Status: Not on file  Intimate Partner Violence: Not At Risk (01/19/2023)   Humiliation, Afraid, Rape, and Kick questionnaire    Fear of Current or Ex-Partner: No    Emotionally Abused: No    Physically Abused: No    Sexually Abused: No    Review of Systems: ROS negative except for what is noted on the assessment and plan.  There were no vitals filed for this visit.  Physical Exam: Constitutional: well-appearing *** sitting in ***, in no acute distress HENT: normocephalic atraumatic, mucous membranes moist Eyes: conjunctiva non-erythematous Cardiovascular: regular rate and rhythm, no m/r/g Pulmonary/Chest: normal work of breathing on room air, lungs clear to auscultation bilaterally Abdominal: soft, non-tender, non-distended MSK: normal bulk and tone Neurological: alert & oriented x 3, no focal deficit Skin: warm and dry Psych: normal mood and behavior  Assessment & Plan:   HTN: - amlodipine 5  COPD: - Trelegy daily   HCM: - flu - mammo - cscope  GAD: - zoloft 150 - hydroxyzine prn  Patient {GC/GE:[id number]::"discussed with","seen with"} Dr. {[id number]::"[id number]","G. Hoffman","Mullen","Narendra","Vincent","Guilloud","Lau","Machen"}  No problem-specific Assessment & Plan notes found for this encounter.   Elza Rafter, D.O. [facility] Internal Medicine, PGY-3 Phone: [phone] Date 03/24/2023 Time 5:06 AM

## 2023-04-10 ENCOUNTER — Other Ambulatory Visit: Payer: Self-pay | Admitting: Internal Medicine

## 2023-04-10 DIAGNOSIS — J449 Chronic obstructive pulmonary disease, unspecified: Secondary | ICD-10-CM

## 2023-04-18 ENCOUNTER — Other Ambulatory Visit: Payer: Self-pay | Admitting: Internal Medicine

## 2023-04-18 DIAGNOSIS — J449 Chronic obstructive pulmonary disease, unspecified: Secondary | ICD-10-CM

## 2023-04-19 ENCOUNTER — Other Ambulatory Visit: Payer: Self-pay | Admitting: Internal Medicine

## 2023-04-19 NOTE — Telephone Encounter (Signed)
 Next appt scheduled 1/9 with Dr Sherrilee Gilles.

## 2023-04-20 ENCOUNTER — Encounter: Payer: 59 | Admitting: Student

## 2023-05-03 ENCOUNTER — Ambulatory Visit (INDEPENDENT_AMBULATORY_CARE_PROVIDER_SITE_OTHER): Payer: 59 | Admitting: Student

## 2023-05-03 VITALS — BP 131/68 | HR 78 | Temp 97.7°F | Ht 60.0 in | Wt 122.9 lb

## 2023-05-03 DIAGNOSIS — N393 Stress incontinence (female) (male): Secondary | ICD-10-CM

## 2023-05-03 DIAGNOSIS — J449 Chronic obstructive pulmonary disease, unspecified: Secondary | ICD-10-CM

## 2023-05-03 DIAGNOSIS — M25561 Pain in right knee: Secondary | ICD-10-CM | POA: Diagnosis not present

## 2023-05-03 DIAGNOSIS — M25562 Pain in left knee: Secondary | ICD-10-CM

## 2023-05-03 MED ORDER — GUAIFENESIN 100 MG/5ML PO LIQD: 5.0000 mL | ORAL | 0 refills | Status: DC | PRN

## 2023-05-03 MED ORDER — BENZONATATE 100 MG PO CAPS: 100.0000 mg | ORAL_CAPSULE | Freq: Four times a day (QID) | ORAL | 1 refills | Status: DC | PRN

## 2023-05-03 MED ORDER — FLUTICASONE PROPIONATE 50 MCG/ACT NA SUSP: 1.0000 | Freq: Every day | NASAL | 2 refills | Status: AC

## 2023-05-03 NOTE — Patient Instructions (Signed)
Thank you, Jody Mcclure for allowing [place] to provide your care today. Today we discussed your incontinence supplies, chronic medical conditions, and cough.   I have ordered the following labs for you:  Lab Orders  No laboratory test(s) ordered today     Tests ordered today:  None  Referrals ordered today:   Referral Orders  No referral(s) requested today     I have ordered the following medication/changed the following medications:   Stop the following medications: There are no discontinued medications.   Start the following medications: Meds ordered this encounter  Medications   benzonatate (TESSALON PERLES) 100 MG capsule    Sig: Take 1 capsule (100 mg total) by mouth every 6 (six) hours as needed for cough.    Dispense:  30 capsule    Refill:  1   guaiFENesin (ROBITUSSIN) 100 MG/5ML liquid    Sig: Take 5 mLs by mouth every 4 (four) hours as needed for cough or to loosen phlegm.    Dispense:  120 mL    Refill:  0   fluticasone (FLONASE) 50 MCG/ACT nasal spray    Sig: Place 1 spray into both nostrils daily.    Dispense:  9.9 mL    Refill:  2     Follow up: 3 months or sooner as needed   Remember:   - I sent some medications in to help relieve some of your cough/congestion symptoms. It sounds like this is due to a viral upper respiratory infection. PLEASE call our clinic if you experience shortness of breath, chest pain, or fever as this may be an indication that something else is going on and we will need to see you to determine next steps. Make sure to stay hydrated and continue to use your inhalers as needed.   - We have completed the form for your incontinence supplies. Please call our clinic if there are any issues with those.   - We will see you in about 3 months to continue talking about your chronic medical conditions, including joint pain, and help determine if you are interested in other therapy at that time.    Should you have any questions or concerns  please call the internal medicine clinic at [phone].     Jody Mackowiak Colbert Coyer, MD PGY-1 Internal Medicine Teaching Progam [facility]

## 2023-05-04 ENCOUNTER — Other Ambulatory Visit: Payer: Self-pay | Admitting: Student

## 2023-05-04 DIAGNOSIS — N393 Stress incontinence (female) (male): Secondary | ICD-10-CM

## 2023-05-04 MED ORDER — DEPEND EASY FIT UNDERGARMENTS MISC: 1.0000 [IU] | Freq: Three times a day (TID) | 11 refills | Status: AC

## 2023-05-05 DIAGNOSIS — M2550 Pain in unspecified joint: Secondary | ICD-10-CM | POA: Insufficient documentation

## 2023-05-05 NOTE — Assessment & Plan Note (Signed)
Patient continues to experience urinary incontinence. Stable from prior visit. Disposable incontinence supplies continue to work to help manage leakage. Patient is not interested in urology referral/workup or medication therapy at this time.  Plan - Placed DME order for incontinence supplies

## 2023-05-05 NOTE — Assessment & Plan Note (Signed)
Patient endorses bilateral knee pain. States knees feel stiffer in cold weather. Pain seems better in the morning and worse over the course of the day. Has been given voltaren gel in the past for joint pain, states it is helpful. No prior imaging for OA available. Patient states Tylenol is helpful. Discussed treatment options including steroid injections, which patient has not tried in the past. Patient will consider steroid injections.  Plan - Return to clinic in 3 months, patient to consider steroid joint injections - Continue Tylenol and voltaren gel as needed

## 2023-05-05 NOTE — Progress Notes (Signed)
Established Patient Office Visit  Subjective   Patient ID: Jody Mcclure, female    DOB: 1955/11/18  Age: 68 y.o. MRN: [id number]  Chief Complaint  Patient presents with   Cough   Nasal Congestion   Knee Pain   thumb pain    Patient is a 67 y.o. with a past medical history stated below who presents today for follow-up for incontinence supplies and chronic medical conditions. She was last seen at [facility] on 06/30/22. Please see problem based assessment and plan for additional details.    Cough Pertinent negatives include no chest pain, chills or fever.  Knee Pain     Past Medical History:  Diagnosis Date   Anxiety    Depression    Hyperlipidemia    Hypertension    Review of Systems  Constitutional:  Negative for chills and fever.  Respiratory:  Positive for cough.   Cardiovascular:  Negative for chest pain and palpitations.  Gastrointestinal:  Negative for abdominal pain, nausea and vomiting.  Genitourinary:  Negative for dysuria.     Objective:     BP 131/68 (BP Location: Left Arm, Patient Position: Sitting, Cuff Size: Small)   Pulse 78   Temp 97.7 F (36.5 C) (Oral)   Ht 5' (1.524 m)   Wt 122 lb 14.4 oz (55.7 kg)   BMI 24.00 kg/m  BP Readings from Last 3 Encounters:  05/03/23 131/68  05/30/22 (!) 158/85  12/27/21 126/72   Wt Readings from Last 3 Encounters:  05/03/23 122 lb 14.4 oz (55.7 kg)  05/30/22 133 lb 3.2 oz (60.4 kg)  12/27/21 137 lb (62.1 kg)   Physical Exam HENT:     Head: Normocephalic and atraumatic.     Mouth/Throat:     Mouth: Mucous membranes are moist.  Cardiovascular:     Rate and Rhythm: Normal rate and regular rhythm.  Pulmonary:     Effort: Pulmonary effort is normal.     Breath sounds: Wheezing present.  Abdominal:     General: There is no distension.     Palpations: Abdomen is soft.     Tenderness: There is no abdominal tenderness.  Musculoskeletal:        General: Normal range of motion.  Skin:    General: Skin is warm and  dry.  Neurological:     General: No focal deficit present.     Mental Status: She is alert.  Psychiatric:        Mood and Affect: Mood normal.        Behavior: Behavior normal.    No results found for any visits on 05/03/23.  Last metabolic panel Lab Results  Component Value Date   GLUCOSE 107 (H) 12/27/2021   NA 138 12/27/2021   K 4.5 12/27/2021   CL 97 12/27/2021   CO2 25 12/27/2021   BUN 10 12/27/2021   CREATININE 0.54 (L) 12/27/2021   EGFR 102 12/27/2021   CALCIUM 10.2 12/27/2021   PHOS 3.6 11/25/2018   PROT 6.7 11/22/2018   ALBUMIN 2.7 (L) 11/22/2018   LABGLOB 2.4 06/03/2016   AGRATIO 1.8 06/03/2016   BILITOT 0.7 11/22/2018   ALKPHOS 92 11/22/2018   AST 29 11/22/2018   ALT 39 11/22/2018   ANIONGAP 9 11/25/2018     The 10-year ASCVD risk score (Arnett DK, et al., 2019) is: 10.6%    Assessment & Plan:   Problem List Items Addressed This Visit     COPD (chronic obstructive pulmonary disease) (HCC) - Primary (  Chronic)   Patient with cough. States she is getting over a URI. Denies fever, sputum, or shortness of breath. Normal respiratory effort on room air. Low concern for COPD exacerbation. Patient happy with Trelegy Ellipta inhaler and albuterol inhaler as needed. Will provide prescriptions for symptomatic relief. Patient given return precautions should symptoms worsen. Patient acknowledged understanding Plan - Continue Trelegy Ellipta inhaler daily and albuterol inhaler PRN - Tessalon Perles 100 mg capsule q6H as needed for cough  - Flonase 1 spray in each nostril daily as needed for congestion  - Guaifenesin liquid 5 mLs q4H as needed for cough       Relevant Medications   benzonatate (TESSALON PERLES) 100 MG capsule   guaiFENesin (ROBITUSSIN) 100 MG/5ML liquid   fluticasone (FLONASE) 50 MCG/ACT nasal spray   Stress incontinence   Patient continues to experience urinary incontinence. Stable from prior visit. Disposable incontinence supplies continue to work  to help manage leakage. Patient is not interested in urology referral/workup or medication therapy at this time.  Plan - Placed DME order for incontinence supplies      Joint pain   Patient endorses bilateral knee pain. States knees feel stiffer in cold weather. Pain seems better in the morning and worse over the course of the day. Has been given voltaren gel in the past for joint pain, states it is helpful. No prior imaging for OA available. Patient states Tylenol is helpful. Discussed treatment options including steroid injections, which patient has not tried in the past. Patient will consider steroid injections.  Plan - Return to clinic in 3 months, patient to consider steroid joint injections - Continue Tylenol and voltaren gel as needed      Patient discussed with Dr. Sol Blazing.  Return in about 3 months (around 08/01/2023) for COPD, HTN, joint pain .   Ceirra Belli Colbert Coyer, MD

## 2023-05-05 NOTE — Assessment & Plan Note (Addendum)
Patient with cough. States she is getting over a URI. Denies fever, sputum, or shortness of breath. Normal respiratory effort on room air. Low concern for COPD exacerbation. Patient happy with Trelegy Ellipta inhaler and albuterol inhaler as needed. Will provide prescriptions for symptomatic relief. Patient given return precautions should symptoms worsen. Patient acknowledged understanding Plan - Continue Trelegy Ellipta inhaler daily and albuterol inhaler PRN - Tessalon Perles 100 mg capsule q6H as needed for cough  - Flonase 1 spray in each nostril daily as needed for congestion  - Guaifenesin liquid 5 mLs q4H as needed for cough

## 2023-05-08 NOTE — Progress Notes (Signed)
Internal Medicine Clinic Attending  Case discussed with the resident at the time of the visit.  We reviewed the resident's history and exam and pertinent patient test results.  I agree with the assessment, diagnosis, and plan of care documented in the resident's note.

## 2023-05-08 NOTE — Addendum Note (Signed)
Addended by: Dickie La on: 05/08/2023 01:23 PM   Modules accepted: Level of Service

## 2023-05-13 ENCOUNTER — Emergency Department (HOSPITAL_COMMUNITY): Payer: 59

## 2023-05-13 ENCOUNTER — Other Ambulatory Visit: Payer: Self-pay

## 2023-05-13 ENCOUNTER — Encounter (HOSPITAL_COMMUNITY): Payer: Self-pay

## 2023-05-13 ENCOUNTER — Observation Stay (HOSPITAL_COMMUNITY)
Admission: EM | Admit: 2023-05-13 | Discharge: 2023-05-15 | Disposition: A | Discharge status: Home / Self Care | Payer: 59 | Attending: Internal Medicine | Admitting: Internal Medicine

## 2023-05-13 DIAGNOSIS — J441 Chronic obstructive pulmonary disease with (acute) exacerbation: Secondary | ICD-10-CM | POA: Diagnosis not present

## 2023-05-13 DIAGNOSIS — Z20822 Contact with and (suspected) exposure to covid-19: Secondary | ICD-10-CM | POA: Insufficient documentation

## 2023-05-13 DIAGNOSIS — E785 Hyperlipidemia, unspecified: Secondary | ICD-10-CM | POA: Diagnosis not present

## 2023-05-13 DIAGNOSIS — I10 Essential (primary) hypertension: Secondary | ICD-10-CM | POA: Diagnosis not present

## 2023-05-13 DIAGNOSIS — Z87891 Personal history of nicotine dependence: Secondary | ICD-10-CM | POA: Diagnosis not present

## 2023-05-13 DIAGNOSIS — B974 Respiratory syncytial virus as the cause of diseases classified elsewhere: Secondary | ICD-10-CM | POA: Diagnosis not present

## 2023-05-13 DIAGNOSIS — E876 Hypokalemia: Secondary | ICD-10-CM | POA: Insufficient documentation

## 2023-05-13 DIAGNOSIS — R0602 Shortness of breath: Secondary | ICD-10-CM | POA: Diagnosis present

## 2023-05-13 DIAGNOSIS — Z79899 Other long term (current) drug therapy: Secondary | ICD-10-CM | POA: Diagnosis not present

## 2023-05-13 LAB — COMPREHENSIVE METABOLIC PANEL
ALT: 18 U/L (ref 0–44)
AST: 16 U/L (ref 15–41)
Albumin: 3.5 g/dL (ref 3.5–5.0)
Alkaline Phosphatase: 97 U/L (ref 38–126)
Anion gap: 10 (ref 5–15)
BUN: 10 mg/dL (ref 8–23)
CO2: 23 mmol/L (ref 22–32)
Calcium: 9 mg/dL (ref 8.9–10.3)
Chloride: 105 mmol/L (ref 98–111)
Creatinine, Ser: 0.46 mg/dL (ref 0.44–1.00)
GFR, Estimated: >60 mL/min (ref >60)
Glucose, Bld: 175 mg/dL — ABNORMAL HIGH (ref 70–99)
Potassium: 3 mmol/L — ABNORMAL LOW (ref 3.5–5.1)
Sodium: 138 mmol/L (ref 135–145)
Total Bilirubin: 0.7 mg/dL (ref 0.0–1.2)
Total Protein: 7.5 g/dL (ref 6.5–8.1)

## 2023-05-13 LAB — CBC WITH DIFFERENTIAL/PLATELET
Abs Immature Granulocytes: 0.2 10*3/uL — ABNORMAL HIGH (ref 0.00–0.07)
Basophils Absolute: 0.1 10*3/uL (ref 0.0–0.1)
Basophils Relative: 0 %
Eosinophils Absolute: 0.1 10*3/uL (ref 0.0–0.5)
Eosinophils Relative: 1 %
HCT: 37.9 % (ref 36.0–46.0)
Hemoglobin: 12.8 g/dL (ref 12.0–15.0)
Immature Granulocytes: 1 %
Lymphocytes Relative: 12 %
Lymphs Abs: 2 10*3/uL (ref 0.7–4.0)
MCH: 29.8 pg (ref 26.0–34.0)
MCHC: 33.8 g/dL (ref 30.0–36.0)
MCV: 88.1 fL (ref 80.0–100.0)
Monocytes Absolute: 1.5 10*3/uL — ABNORMAL HIGH (ref 0.1–1.0)
Monocytes Relative: 9 %
Neutro Abs: 13 10*3/uL — ABNORMAL HIGH (ref 1.7–7.7)
Neutrophils Relative %: 77 %
Platelets: 357 10*3/uL (ref 150–400)
RBC: 4.3 MIL/uL (ref 3.87–5.11)
RDW: 12.9 % (ref 11.5–15.5)
WBC: 16.8 10*3/uL — ABNORMAL HIGH (ref 4.0–10.5)
nRBC: 0 % (ref 0.0–0.2)

## 2023-05-13 LAB — RESP PANEL BY RT-PCR (RSV, FLU A&B, COVID)  RVPGX2
Influenza A by PCR: NEGATIVE
Influenza B by PCR: NEGATIVE
Resp Syncytial Virus by PCR: POSITIVE — AB
SARS Coronavirus 2 by RT PCR: NEGATIVE

## 2023-05-13 LAB — TROPONIN I (HIGH SENSITIVITY)
Troponin I (High Sensitivity): 6 ng/L (ref <18)
Troponin I (High Sensitivity): 4 ng/L (ref <18)

## 2023-05-13 LAB — BLOOD GAS, VENOUS
Acid-Base Excess: 4.2 mmol/L — ABNORMAL HIGH (ref 0.0–2.0)
Bicarbonate: 28.4 mmol/L — ABNORMAL HIGH (ref 20.0–28.0)
O2 Saturation: 89.5 %
Patient temperature: 37
pCO2, Ven: 40 mm[Hg] — ABNORMAL LOW (ref 44–60)
pH, Ven: 7.46 — ABNORMAL HIGH (ref 7.25–7.43)
pO2, Ven: 55 mm[Hg] — ABNORMAL HIGH (ref 32–45)

## 2023-05-13 LAB — I-STAT CG4 LACTIC ACID, ED: Lactic Acid, Venous: <0.3 mmol/L — ABNORMAL LOW (ref 0.5–1.9)

## 2023-05-13 LAB — BRAIN NATRIURETIC PEPTIDE: B Natriuretic Peptide: 90.1 pg/mL (ref 0.0–100.0)

## 2023-05-13 MED ORDER — POTASSIUM CHLORIDE CRYS ER 20 MEQ PO TBCR
40.0000 meq | EXTENDED_RELEASE_TABLET | Freq: Once | ORAL | Status: AC
  Administered 2023-05-13: 40 meq via ORAL
  Filled 2023-05-13: qty 2

## 2023-05-13 MED ORDER — ACETAMINOPHEN 650 MG RE SUPP: 650.0000 mg | Freq: Four times a day (QID) | RECTAL | Status: DC | PRN

## 2023-05-13 MED ORDER — GUAIFENESIN 100 MG/5ML PO LIQD: 5.0000 mL | ORAL | Status: DC | PRN

## 2023-05-13 MED ORDER — BENZONATATE 100 MG PO CAPS
100.0000 mg | ORAL_CAPSULE | Freq: Three times a day (TID) | ORAL | Status: DC | PRN
  Administered 2023-05-13: 100 mg via ORAL
  Filled 2023-05-13: qty 1

## 2023-05-13 MED ORDER — ONDANSETRON HCL 4 MG/2ML IJ SOLN: 4.0000 mg | Freq: Four times a day (QID) | INTRAMUSCULAR | Status: DC | PRN

## 2023-05-13 MED ORDER — UMECLIDINIUM BROMIDE 62.5 MCG/ACT IN AEPB
1.0000 | INHALATION_SPRAY | Freq: Every day | RESPIRATORY_TRACT | Status: DC
  Administered 2023-05-14 – 2023-05-15 (×2): 1 via RESPIRATORY_TRACT
  Filled 2023-05-13: qty 7

## 2023-05-13 MED ORDER — ALBUTEROL SULFATE (2.5 MG/3ML) 0.083% IN NEBU: 2.5000 mg | INHALATION_SOLUTION | RESPIRATORY_TRACT | Status: DC | PRN

## 2023-05-13 MED ORDER — METHYLPREDNISOLONE SODIUM SUCC 40 MG IJ SOLR
40.0000 mg | Freq: Two times a day (BID) | INTRAMUSCULAR | Status: DC
  Administered 2023-05-14 – 2023-05-15 (×3): 40 mg via INTRAVENOUS
  Filled 2023-05-13 (×3): qty 1

## 2023-05-13 MED ORDER — TRAZODONE HCL 50 MG PO TABS
50.0000 mg | ORAL_TABLET | Freq: Every evening | ORAL | Status: DC | PRN
  Administered 2023-05-15: 50 mg via ORAL
  Filled 2023-05-13 (×2): qty 1

## 2023-05-13 MED ORDER — FLUTICASONE FUROATE-VILANTEROL 100-25 MCG/ACT IN AEPB
1.0000 | INHALATION_SPRAY | Freq: Every day | RESPIRATORY_TRACT | Status: DC
  Administered 2023-05-14 – 2023-05-15 (×2): 1 via RESPIRATORY_TRACT
  Filled 2023-05-13: qty 28

## 2023-05-13 MED ORDER — ACETAMINOPHEN 325 MG PO TABS: 650.0000 mg | ORAL_TABLET | Freq: Four times a day (QID) | ORAL | Status: DC | PRN

## 2023-05-13 MED ORDER — PRAVASTATIN SODIUM 40 MG PO TABS
80.0000 mg | ORAL_TABLET | Freq: Every evening | ORAL | Status: DC
  Administered 2023-05-13 – 2023-05-14 (×2): 80 mg via ORAL
  Filled 2023-05-13: qty 4
  Filled 2023-05-13 (×2): qty 2
  Filled 2023-05-13: qty 4
  Filled 2023-05-13: qty 2

## 2023-05-13 MED ORDER — ENOXAPARIN SODIUM 40 MG/0.4ML IJ SOSY
40.0000 mg | PREFILLED_SYRINGE | INTRAMUSCULAR | Status: DC
  Administered 2023-05-13 – 2023-05-14 (×2): 40 mg via SUBCUTANEOUS
  Filled 2023-05-13 (×2): qty 0.4

## 2023-05-13 MED ORDER — HYDROXYZINE HCL 25 MG PO TABS
25.0000 mg | ORAL_TABLET | Freq: Three times a day (TID) | ORAL | Status: DC | PRN
  Administered 2023-05-13: 25 mg via ORAL
  Filled 2023-05-13: qty 1

## 2023-05-13 MED ORDER — FLUTICASONE PROPIONATE 50 MCG/ACT NA SUSP
1.0000 | Freq: Every day | NASAL | Status: DC
  Administered 2023-05-14 – 2023-05-15 (×2): 1 via NASAL
  Filled 2023-05-13 (×2): qty 16

## 2023-05-13 MED ORDER — SERTRALINE HCL 50 MG PO TABS
150.0000 mg | ORAL_TABLET | Freq: Every day | ORAL | Status: DC
  Administered 2023-05-13 – 2023-05-15 (×3): 150 mg via ORAL
  Filled 2023-05-13 (×3): qty 3

## 2023-05-13 MED ORDER — ONDANSETRON HCL 4 MG PO TABS: 4.0000 mg | ORAL_TABLET | Freq: Four times a day (QID) | ORAL | Status: DC | PRN

## 2023-05-13 NOTE — H&P (Signed)
History and Physical  Jody Mcclure:[id number] DOB: [birth year] DOA: 05/13/2023  PCP: Philomena Doheny, MD   Chief Complaint: Cough, shortness of breath  HPI: Jody Mcclure is a 68 y.o. female with medical history significant for COPD on room air, hypertension, hyperlipidemia as well as depression and anxiety being admitted to the hospital with acute exacerbation of COPD in the setting of RSV infection.  She denies any sick contacts, about 4 days ago she started having nasal congestion, cough, dyspnea with exertion.  Denies any chest pain, night sweats, fevers, chills.  She says that she has a wet cough, but without sputum production.  She called EMS, was noted to be saturating in the mid 80s on room air.  She was placed on CPAP for transport, given 125 mg Solu-Medrol, 2 g IV magnesium, albuterol nebulizer and Atrovent en route.  States that she feels much better already.  Review of Systems: Please see HPI for pertinent positives and negatives. A complete 10 system review of systems are otherwise negative.  Past Medical History:  Diagnosis Date   Anxiety    Depression    Hyperlipidemia    Hypertension    Past Surgical History:  Procedure Laterality Date   ABDOMINAL HYSTERECTOMY     TONSILLECTOMY     Social History:  reports that she quit smoking about 5 years ago. Her smoking use included cigarettes. She has never used smokeless tobacco. She reports current alcohol use of about 2.0 standard drinks of alcohol per week. She reports that she does not currently use drugs after having used the following drugs: Cocaine.  Allergies  Allergen Reactions   Codeine Nausea And Vomiting   Sulfa Antibiotics Rash    Family History  Problem Relation Age of Onset   Cancer Mother        lung   Hyperlipidemia Mother    Hypertension Father    Hyperlipidemia Father    Colon cancer Neg Hx    Colon polyps Neg Hx    Rectal cancer Neg Hx    Stomach cancer Neg Hx      Prior to Admission  medications   Medication Sig Start Date End Date Taking? Authorizing Provider  albuterol (PROVENTIL) (2.5 MG/3ML) 0.083% nebulizer solution Take 3 mLs (2.5 mg total) by nebulization 2 (two) times daily as needed for wheezing or shortness of breath. 12/29/21 03/29/22  Evlyn Kanner, MD  albuterol (VENTOLIN HFA) 108 (90 Base) MCG/ACT inhaler Inhale 2 puffs into the lungs every 6 (six) hours as needed for wheezing or shortness of breath. 04/10/23   Champ Mungo, DO  amLODipine (NORVASC) 5 MG tablet Take 1 tablet (5 mg total) by mouth daily. 01/12/23   Gwenevere Abbot, MD  benzonatate (TESSALON PERLES) 100 MG capsule Take 1 capsule (100 mg total) by mouth every 6 (six) hours as needed for cough. 05/03/23 05/02/24  Philomena Doheny, MD  betamethasone valerate ointment (VALISONE) 0.1 % APPLY TO AFFECTED AREA TWICE A DAY 04/21/23   Colbert Coyer, Priscila, MD  diclofenac Sodium (VOLTAREN) 1 % GEL Apply 2 g topically 4 (four) times daily. 01/12/23   Gwenevere Abbot, MD  fluticasone [facility]) 50 MCG/ACT nasal spray Place 1 spray into both nostrils daily. 05/03/23 05/02/24  Philomena Doheny, MD  guaiFENesin (ROBITUSSIN) 100 MG/5ML liquid Take 5 mLs by mouth every 4 (four) hours as needed for cough or to loosen phlegm. 05/03/23   Philomena Doheny, MD  hydrocortisone 2.5 % lotion Apply to affected area 2 times  daily 06/30/22 06/30/23  Crissie Sickles, MD  hydrOXYzine (ATARAX) 25 MG tablet Take 1 tablet (25 mg total) by mouth 3 (three) times daily as needed. 01/12/23   Gwenevere Abbot, MD  Incontinence Supply Disposable (DEPEND EASY FIT UNDERGARMENTS) MISC 1 Units by Does not apply route 3 (three) times daily. 05/04/23   Philomena Doheny, MD  pravastatin (PRAVACHOL) 80 MG tablet Take 1 tablet (80 mg total) by mouth every evening. 01/12/23   Gwenevere Abbot, MD  sertraline (ZOLOFT) 100 MG tablet Take 1.5 tablets (150 mg total) by mouth daily. 01/12/23   Gwenevere Abbot, MD  TRELEGY ELLIPTA 100-62.5-25  MCG/ACT AEPB Inhale 1 puff into the lungs daily for 90 doses. 04/18/23 07/17/23  Philomena Doheny, MD  omeprazole (PRILOSEC OTC) 20 MG tablet Take 1 tablet (20 mg total) by mouth daily. 12/13/12 04/09/13  Reva Bores, MD    Physical Exam: BP 124/70   Pulse 83   Temp 97.9 F (36.6 C) (Oral)   Resp 20   SpO2 94%  General:  Alert, oriented, calm, in no acute distress, intermittent cough, wearing 3 L nasal cannula Cardiovascular: RRR, no murmurs or rubs, no peripheral edema  Respiratory: Breath sounds are distant bilaterally, with some end expiratory wheezing, no tachypnea, no rhonchi or stridor. Abdomen: soft, nontender, slightly distended, normal bowel tones heard  Skin: dry, no rashes  Musculoskeletal: no joint effusions, normal range of motion  Psychiatric: appropriate affect, normal speech  Neurologic: extraocular muscles intact, clear speech, moving all extremities with intact sensorium         Labs on Admission:  Basic Metabolic Panel: Recent Labs  Lab 05/13/23 1402  NA 138  K 3.0*  CL 105  CO2 23  GLUCOSE 175*  BUN 10  CREATININE 0.46  CALCIUM 9.0   Liver Function Tests: Recent Labs  Lab 05/13/23 1402  AST 16  ALT 18  ALKPHOS 97  BILITOT 0.7  PROT 7.5  ALBUMIN 3.5   No results for input(s): "LIPASE", "AMYLASE" in the last 168 hours. No results for input(s): "AMMONIA" in the last 168 hours. CBC: Recent Labs  Lab 05/13/23 1402  WBC 16.8*  NEUTROABS 13.0*  HGB 12.8  HCT 37.9  MCV 88.1  PLT 357   Cardiac Enzymes: No results for input(s): "CKTOTAL", "CKMB", "CKMBINDEX", "TROPONINI" in the last 168 hours. BNP (last 3 results) Recent Labs    05/13/23 1402  BNP 90.1    ProBNP (last 3 results) No results for input(s): "PROBNP" in the last 8760 hours.  CBG: No results for input(s): "GLUCAP" in the last 168 hours.  Radiological Exams on Admission: DG Chest Port 1 View Result Date: 05/13/2023 CLINICAL DATA:  Shortness of breath EXAM: PORTABLE  CHEST 1 VIEW COMPARISON:  X-ray 11/21/2018.  CTA as well FINDINGS: Hyperinflation. No pneumothorax or effusion. No edema. Normal cardiopericardial silhouette. Chronic lung changes. More focal asymmetric opacity at the left lung base. Old left rib fractures. Films are under penetrated. IMPRESSION: Hyperinflation with chronic changes. There is asymmetric opacity left lung base. A subtle infiltrate is possible. Recommend follow-up Electronically Signed   By: Karen Kays M.D.   On: 05/13/2023 14:26   Assessment/Plan Jody Mcclure is a 68 y.o. female with medical history significant for COPD on room air, hypertension, hyperlipidemia as well as depression and anxiety being admitted to the hospital with acute exacerbation of COPD in the setting of RSV infection.  Acute exacerbation of COPD-with increased cough, wheezing, dyspnea on exertion, and acute hypoxic  respiratory failure.  Likely due to RSV infection.  Without lower extremity edema, persistent tachycardia, and with findings consistent with acute COPD exacerbation, my suspicion for PE is very low. -Observation admission -Supplemental oxygen as needed, with goal O2 saturation greater than 90% -Continue Trelegy, with albuterol as needed -Incentive spirometry, and flutter valve -IV Solu-Medrol 40 mg every 12 hours, starting tomorrow morning -Tessalon Perles or Robitussin as needed for cough -Continue Flonase  Leukocytosis-possible stress reaction, versus demargination due to IV Solu-Medrol.  I do not have a high suspicion for bacterial pneumonia.  RSV infection-supportive care and droplet precautions  Hyperlipidemia-Pravachol  Hypokalemia-repleted orally in the ER, recheck with morning labs  Depression and anxiety-continue home Zoloft, with Atarax as needed  DVT prophylaxis: Lovenox     Code Status: Full Code  Consults called: None  Admission status: Observation  Time spent: 49 minutes  Anari Evitt Sharlette Dense MD [facility] Pager  [phone]  If 7PM-7AM, please contact night-coverage [email or web address] Password [facility]  05/13/2023, 4:33 PM

## 2023-05-13 NOTE — ED Triage Notes (Signed)
EMS reports from home, called out for SOB, Hx of copd, labored breathing,SATs 80s on site with Rhonchi. Placed on CPAP, with some improvement.  18 R Hand  125mg  Solumedrol 2gms Magnesium 15mg  Albuterol 1mg  Atrovent enroute.

## 2023-05-13 NOTE — ED Provider Notes (Signed)
[place] EMERGENCY DEPARTMENT AT [facility] Provider Note   CSN: [id number] Arrival date & time: 05/13/23  1342     History  Chief Complaint  Patient presents with   Shortness of Breath    Jody Mcclure is a 68 y.o. female.  68 year old female with prior medical history as detailed below presents for evaluation.  Patient was history of COPD.  Patient reports increased shortness of breath and wheezing x 2 to 3 days.  EMS reports patient with significant discomfort and respiratory distress on initial evaluation.  Patient was noted to be hypoxic into the 80s on room air at home.  Patient given Solu-Medrol, magnesium, albuterol, Atrovent.  Patient was briefly on CPAP during transport.  On initial evaluation of the patient in the ED, she feels improved.  She is currently on 3 L nasal cannula and satting at approximately 93%.  She reports improvement in her breathing with EMS treatment.  The history is provided by the patient and medical records.       Home Medications Prior to Admission medications   Medication Sig Start Date End Date Taking? Authorizing Provider  albuterol (PROVENTIL) (2.5 MG/3ML) 0.083% nebulizer solution Take 3 mLs (2.5 mg total) by nebulization 2 (two) times daily as needed for wheezing or shortness of breath. 12/29/21 03/29/22  Evlyn Kanner, MD  albuterol (VENTOLIN HFA) 108 (90 Base) MCG/ACT inhaler Inhale 2 puffs into the lungs every 6 (six) hours as needed for wheezing or shortness of breath. 04/10/23   Champ Mungo, DO  amLODipine (NORVASC) 5 MG tablet Take 1 tablet (5 mg total) by mouth daily. 01/12/23   Gwenevere Abbot, MD  benzonatate (TESSALON PERLES) 100 MG capsule Take 1 capsule (100 mg total) by mouth every 6 (six) hours as needed for cough. 05/03/23 05/02/24  Philomena Doheny, MD  betamethasone valerate ointment (VALISONE) 0.1 % APPLY TO AFFECTED AREA TWICE A DAY 04/21/23   Colbert Coyer, Priscila, MD  diclofenac Sodium (VOLTAREN) 1 %  GEL Apply 2 g topically 4 (four) times daily. 01/12/23   Gwenevere Abbot, MD  fluticasone [facility]) 50 MCG/ACT nasal spray Place 1 spray into both nostrils daily. 05/03/23 05/02/24  Philomena Doheny, MD  guaiFENesin (ROBITUSSIN) 100 MG/5ML liquid Take 5 mLs by mouth every 4 (four) hours as needed for cough or to loosen phlegm. 05/03/23   Philomena Doheny, MD  hydrocortisone 2.5 % lotion Apply to affected area 2 times daily 06/30/22 06/30/23  Crissie Sickles, MD  hydrOXYzine (ATARAX) 25 MG tablet Take 1 tablet (25 mg total) by mouth 3 (three) times daily as needed. 01/12/23   Gwenevere Abbot, MD  Incontinence Supply Disposable (DEPEND EASY FIT UNDERGARMENTS) MISC 1 Units by Does not apply route 3 (three) times daily. 05/04/23   Philomena Doheny, MD  pravastatin (PRAVACHOL) 80 MG tablet Take 1 tablet (80 mg total) by mouth every evening. 01/12/23   Gwenevere Abbot, MD  sertraline (ZOLOFT) 100 MG tablet Take 1.5 tablets (150 mg total) by mouth daily. 01/12/23   Gwenevere Abbot, MD  TRELEGY ELLIPTA 100-62.5-25 MCG/ACT AEPB Inhale 1 puff into the lungs daily for 90 doses. 04/18/23 07/17/23  Philomena Doheny, MD  omeprazole (PRILOSEC OTC) 20 MG tablet Take 1 tablet (20 mg total) by mouth daily. 12/13/12 04/09/13  Reva Bores, MD      Allergies    Codeine and Sulfa antibiotics    Review of Systems   Review of Systems  All other systems reviewed and are negative.  Physical Exam Updated Vital Signs BP 116/72   Pulse 89   Temp 97.9 F (36.6 C) (Oral)   Resp 20   SpO2 98%  Physical Exam Vitals and nursing note reviewed.  Constitutional:      General: She is not in acute distress.    Appearance: Normal appearance. She is well-developed.  HENT:     Head: Normocephalic and atraumatic.  Eyes:     Conjunctiva/sclera: Conjunctivae normal.     Pupils: Pupils are equal, round, and reactive to light.  Cardiovascular:     Rate and Rhythm: Normal rate and regular rhythm.     Heart  sounds: Normal heart sounds.  Pulmonary:     Effort: Tachypnea present. No respiratory distress.     Comments: Mild diffuse expiratory wheezes in all lung fields Abdominal:     General: There is no distension.     Palpations: Abdomen is soft.     Tenderness: There is no abdominal tenderness.  Musculoskeletal:        General: No deformity. Normal range of motion.     Cervical back: Normal range of motion and neck supple.  Skin:    General: Skin is warm and dry.  Neurological:     General: No focal deficit present.     Mental Status: She is alert and oriented to person, place, and time.     ED Results / Procedures / Treatments   Labs (all labs ordered are listed, but only abnormal results are displayed) Labs Reviewed  RESP PANEL BY RT-PCR (RSV, FLU A&B, COVID)  [id number]  CBC WITH DIFFERENTIAL/PLATELET  COMPREHENSIVE METABOLIC PANEL  BRAIN NATRIURETIC PEPTIDE  BLOOD GAS, VENOUS  I-STAT CG4 LACTIC ACID, ED  TROPONIN I (HIGH SENSITIVITY)    EKG None  Radiology No results found.  Procedures Procedures    Medications Ordered in ED Medications - No data to display  ED Course/ Medical Decision Making/ A&P                                 Medical Decision Making Amount and/or Complexity of Data Reviewed Labs: ordered. Radiology: ordered.  Risk Prescription drug management.    Medical Screen Complete  This patient presented to the ED with complaint of respiratory distress.  This complaint involves an extensive number of treatment options. The initial differential diagnosis includes, but is not limited to, infection, COPD exacerbation, metabolic abnormality, etc.  This presentation is: Acute, Chronic, Self-Limited, Previously Undiagnosed, Uncertain Prognosis, Complicated, Systemic Symptoms, and Threat to Life/Bodily Function  Patient is presenting with increased shortness of breath, wheezing, cough.  Patient improved after EMS treatment.  Workup demonstrates  evidence of positive RSV test.  Additionally potassium is 3.0.  White count is 16.8.  Patient is improved with ED evaluation and treatment.  However, she would benefit from admission for further workup and treatment.  Hospitalist service is aware of case and evaluate for admission. Additional history obtained:  External records from outside sources obtained and reviewed including prior ED visits and prior Inpatient records.    Lab Tests:  I ordered and personally interpreted labs.  The pertinent results include: CBC, CMP, troponin, VBG, COVID, flu, RSV, BNP   Imaging Studies ordered:  I ordered imaging studies including chest x-ray I independently visualized and interpreted obtained imaging which showed questionable, subtle, possible infiltrate at the left base I agree with the radiologist interpretation.   Cardiac Monitoring:  The patient  was maintained on a cardiac monitor.  I personally viewed and interpreted the cardiac monitor which showed an underlying rhythm of: NSR   Medicines ordered:  I ordered medication including potassium for hypokalemia Reevaluation of the patient after these medicines showed that the patient: improved   Problem List / ED Course:  COPD since patient, RSV infection   Reevaluation:  After the interventions noted above, I reevaluated the patient and found that they have: improved  Disposition:  After consideration of the diagnostic results and the patients response to treatment, I feel that the patent would benefit from admission.          Final Clinical Impression(s) / ED Diagnoses Final diagnoses:  COPD exacerbation [facility])    Rx / DC Orders ED Discharge Orders     None         Wynetta Fines, MD 05/13/23 [phone]

## 2023-05-14 DIAGNOSIS — J441 Chronic obstructive pulmonary disease with (acute) exacerbation: Secondary | ICD-10-CM

## 2023-05-14 LAB — CBC
HCT: 40.5 % (ref 36.0–46.0)
Hemoglobin: 13 g/dL (ref 12.0–15.0)
MCH: 29.1 pg (ref 26.0–34.0)
MCHC: 32.1 g/dL (ref 30.0–36.0)
MCV: 90.6 fL (ref 80.0–100.0)
Platelets: 354 10*3/uL (ref 150–400)
RBC: 4.47 MIL/uL (ref 3.87–5.11)
RDW: 13.1 % (ref 11.5–15.5)
WBC: 13.1 10*3/uL — ABNORMAL HIGH (ref 4.0–10.5)
nRBC: 0 % (ref 0.0–0.2)

## 2023-05-14 LAB — BASIC METABOLIC PANEL
Anion gap: 7 (ref 5–15)
BUN: 7 mg/dL — ABNORMAL LOW (ref 8–23)
CO2: 26 mmol/L (ref 22–32)
Calcium: 9.1 mg/dL (ref 8.9–10.3)
Chloride: 104 mmol/L (ref 98–111)
Creatinine, Ser: 0.41 mg/dL — ABNORMAL LOW (ref 0.44–1.00)
GFR, Estimated: >60 mL/min (ref >60)
Glucose, Bld: 118 mg/dL — ABNORMAL HIGH (ref 70–99)
Potassium: 4.1 mmol/L (ref 3.5–5.1)
Sodium: 137 mmol/L (ref 135–145)

## 2023-05-14 LAB — HIV ANTIBODY (ROUTINE TESTING W REFLEX): HIV Screen 4th Generation wRfx: NONREACTIVE

## 2023-05-14 NOTE — Care Management Obs Status (Signed)
MEDICARE OBSERVATION STATUS NOTIFICATION   Patient Details  Name: Jody Mcclure MRN: [id number] Date of Birth: 09-Jan-1956   Medicare Observation Status Notification Given:  Yes    Adrian Prows, RN 05/14/2023, 1:43 PM

## 2023-05-14 NOTE — Evaluation (Signed)
Physical Therapy Evaluation Patient Details Name: Jody Mcclure MRN: [id number] DOB: October 07, 1955 Today's Date: 05/14/2023  History of Present Illness  68 y.o. female admitted to the hospital with acute exacerbation of COPD in the setting of RSV infection.  with medical history significant for COPD on room air, hypertension, hyperlipidemia as well as depression and anxiety  Clinical Impression  Patient evaluated by Physical Therapy with no further acute PT needs identified. All education has been completed and the patient has no further questions.  Amb ~120' -steady gait, no LOB with turns, head turns, direction changes; 2/3 DOE, SpO2=94% on RA Encouraged pt to continue to mobilize as tol during hospital stay. No post acute f/u PT needs at this time   See below for any follow-up Physical Therapy or equipment needs. PT is signing off. Thank you for this referral.         If plan is discharge home, recommend the following: Help with stairs or ramp for entrance   Can travel by private vehicle        Equipment Recommendations None recommended by PT  Recommendations for Other Services       Functional Status Assessment Patient has not had a recent decline in their functional status     Precautions / Restrictions Precautions Precautions: None      Mobility  Bed Mobility Overal bed mobility: Independent                  Transfers Overall transfer level: Independent                      Ambulation/Gait Ambulation/Gait assistance: Modified independent (Device/Increase time) Gait Distance (Feet): 120 Feet Assistive device: None Gait Pattern/deviations: Step-through pattern Gait velocity: WFL     General Gait Details: steady gait, no LOB with turns, head turns, direction changes; 2/3 DOE, SpO2=94% on RA  Stairs            Wheelchair Mobility     Tilt Bed    Modified Rankin (Stroke Patients Only)       Balance   Sitting-balance support: No upper  extremity supported, Feet supported Sitting balance-Leahy Scale: Normal     Standing balance support: No upper extremity supported                 High level balance activites: Side stepping, Direction changes, Turns, Head turns High Level Balance Comments: no LOB             Pertinent Vitals/Pain Pain Assessment Pain Assessment: No/denies pain    Home Living Family/patient expects to be discharged to:: Private residence Living Arrangements: Spouse/significant other Available Help at Discharge: Family Type of Home: Apartment (3rd floor) Home Access: Elevator         Home Equipment: None      Prior Function Prior Level of Function : Independent/Modified Independent                     Extremity/Trunk Assessment        Lower Extremity Assessment Lower Extremity Assessment: Overall WFL for tasks assessed       Communication   Communication Communication: No apparent difficulties  Cognition Arousal: Alert Behavior During Therapy: WFL for tasks assessed/performed Overall Cognitive Status: Within Functional Limits for tasks assessed  General Comments      Exercises     Assessment/Plan    PT Assessment Patient does not need any further PT services  PT Problem List         PT Treatment Interventions      PT Goals (Current goals can be found in the Care Plan section)  Acute Rehab PT Goals PT Goal Formulation: All assessment and education complete, DC therapy    Frequency       Co-evaluation               AM-PAC PT "6 Clicks" Mobility  Outcome Measure Help needed turning from your back to your side while in a flat bed without using bedrails?: None Help needed moving from lying on your back to sitting on the side of a flat bed without using bedrails?: None Help needed moving to and from a bed to a chair (including a wheelchair)?: None Help needed standing up from a  chair using your arms (e.g., wheelchair or bedside chair)?: None Help needed to walk in hospital room?: None Help needed climbing 3-5 steps with a railing? : A Little 6 Click Score: 23    End of Session Equipment Utilized During Treatment: Gait belt Activity Tolerance: Patient tolerated treatment well Patient left: in bed;with call bell/phone within reach   PT Visit Diagnosis: Other abnormalities of gait and mobility (R26.89)    Time: 6045-4098 PT Time Calculation (min) (ACUTE ONLY): 18 min   Charges:   PT Evaluation $PT Eval Low Complexity: 1 Low   PT General Charges $$ ACUTE PT VISIT: 1 Visit         Liann Spaeth, PT  Acute Rehab Dept [facility]) [phone]  05/14/2023   [facility] 05/14/2023, 10:49 AM

## 2023-05-14 NOTE — TOC Initial Note (Signed)
Transition of Care [facility]) - Initial/Assessment Note    Patient Details  Name: Jody Mcclure MRN: [id number] Date of Birth: 19-Jun-1955  Transition of [facility]) CM/SW Contact:    Adrian Prows, RN Phone Number: 05/14/2023, 3:15 PM  Clinical Narrative:                 Sherron Monday w/ pt in room; pt says she lives at home w/ her S.O. Jerl Santos [phone]); she plans to return at d/c; she says her S.O. will provide transportation pt verified insurance/PCP; she denies SDOH risks; pt says she does not have DME, HH services, or home oxygen; also I, Adrian Prows, RN, verbally reviewed observation notice.  05/14/2023, 1343; TOC will follow.   Expected Discharge Plan: Home/Self Care Barriers to Discharge: Continued Medical Work up   Patient Goals and CMS Choice Patient states their goals for this hospitalization and ongoing recovery are:: home CMS Medicare.gov Compare Post Acute Care list provided to:: Patient        Expected Discharge Plan and Services   Discharge Planning Services: CM Consult   Living arrangements for the past 2 months: Apartment                                      Prior Living Arrangements/Services Living arrangements for the past 2 months: Apartment Lives with:: Significant Other Patient language and need for interpreter reviewed:: Yes Do you feel safe going back to the place where you live?: Yes      Need for Family Participation in Patient Care: Yes (Comment) Care giver support system in place?: Yes (comment) Current home services:  (n/a) Criminal Activity/Legal Involvement Pertinent to Current Situation/Hospitalization: No - Comment as needed  Activities of Daily Living   ADL Screening (condition at time of admission) Independently performs ADLs?: Yes (appropriate for developmental age) Is the patient deaf or have difficulty hearing?: No Does the patient have difficulty seeing, even when wearing glasses/contacts?: No Does the  patient have difficulty concentrating, remembering, or making decisions?: No  Permission Sought/Granted Permission sought to share information with : Case Manager Permission granted to share information with : Yes, Verbal Permission Granted  Share Information with NAME: Case Manager     Permission granted to share info w Relationship: Jerl Santos (S.O.) [phone]     Emotional Assessment Appearance:: Appears stated age Attitude/Demeanor/Rapport: Gracious Affect (typically observed): Accepting Orientation: : Oriented to Self, Oriented to Place, Oriented to  Time, Oriented to Situation Alcohol / Substance Use: Not Applicable Psych Involvement: No (comment)  Admission diagnosis:  COPD exacerbation (HCC) [J44.1] Patient Active Problem List   Diagnosis Date Noted   COPD exacerbation (HCC) 05/13/2023   Joint pain 05/05/2023   Contact dermatitis 05/30/2022   Bilateral leg cramps 12/29/2021   Bilateral carpal tunnel syndrome 12/29/2021   Weight gain 07/07/2020   Hypercholesterolemia with hypertriglyceridemia 12/31/2019   Stress incontinence 04/01/2018   COPD (chronic obstructive pulmonary disease) (HCC) 03/05/2018   Mild dementia (HCC) 11/15/2017   Encounter for screening mammogram for breast cancer 06/03/2016   Lumbar radiculopathy, chronic 06/03/2016   Generalized anxiety disorder 06/03/2016   Healthcare maintenance 06/03/2016   GERD (gastroesophageal reflux disease) 12/13/2012   Depression 12/13/2012   Dysphagia 04/18/2012   Hypertension 08/15/2011   Rash and nonspecific skin eruption 08/15/2011   PCP:  Philomena Doheny, MD Pharmacy:   [facility] [place], [place] - [phone]  [address] [place] [address] Phone: [phone] Fax: [phone]     Social Drivers of Health (SDOH) Social History: SDOH Screenings   Food Insecurity: No Food Insecurity (05/14/2023)  Housing: Low Risk  (05/14/2023)  Transportation Needs:  No Transportation Needs (05/14/2023)  Utilities: Not At Risk (05/13/2023)  Alcohol Screen: Low Risk  (01/19/2023)  Depression (PHQ2-9): Medium Risk (01/19/2023)  Financial Resource Strain: Low Risk  (01/19/2023)  Physical Activity: Sufficiently Active (01/19/2023)  Social Connections: Moderately Isolated (05/13/2023)  Stress: No Stress Concern Present (01/19/2023)  Tobacco Use: Medium Risk (05/13/2023)  Health Literacy: Adequate Health Literacy (01/19/2023)   SDOH Interventions: Food Insecurity Interventions: Intervention Not Indicated, Inpatient TOC Housing Interventions: Intervention Not Indicated, Inpatient TOC Transportation Interventions: Intervention Not Indicated, Inpatient TOC   Readmission Risk Interventions     No data to display

## 2023-05-14 NOTE — Progress Notes (Signed)
[facility] PROGRESS NOTE    Progress Note  Jody Mcclure  NUU:[id number] DOB: 12/12/1955 DOA: 05/13/2023 PCP: Philomena Doheny, MD     Brief Narrative:   Jody Mcclure is an 68 y.o. female past medical history of COPD, essential hypertension hyperlipidemia was admitted for an acute COPD exacerbation in the setting of RSV infection.  She started having symptoms of cough shortness of breath and dyspnea on exertion about 4 days prior to admission denies a fever.  Assessment/Plan:   Acute COPD exacerbation in the RSV infection: She is satting 100% on room air.  Has remained afebrile leukocytosis improved. She was started on steroids and inhalers Leukocytosis is improving no point of steroids at this time. Out of bed to chair, continue incentive spirometry and flutter valve.  RSV infection: Continue supportive care.  Hyperlipidemia:  continue protocol.  Hypokalemia: Repleted now improved   DVT prophylaxis: lovenox Family Communication:none Status is: Observation The patient remains OBS appropriate and will d/c before 2 midnights.    Code Status:     Code Status Orders  (From admission, onward)           Start     Ordered   05/13/23 1633  Full code  Continuous       Question:  By:  Answer:  Consent: discussion documented in EHR   05/13/23 1633           Code Status History     Date Active Date Inactive Code Status Order ID Comments User Context   11/21/2018 1557 11/25/2018 1710 Full Code [id number]  Zigmund Daniel., MD ED         IV Access:   Peripheral IV   Procedures and diagnostic studies:   DG Chest Port 1 View Result Date: 05/13/2023 CLINICAL DATA:  Shortness of breath EXAM: PORTABLE CHEST 1 VIEW COMPARISON:  X-ray 11/21/2018.  CTA as well FINDINGS: Hyperinflation. No pneumothorax or effusion. No edema. Normal cardiopericardial silhouette. Chronic lung changes. More focal asymmetric opacity at the left lung base. Old left rib  fractures. Films are under penetrated. IMPRESSION: Hyperinflation with chronic changes. There is asymmetric opacity left lung base. A subtle infiltrate is possible. Recommend follow-up Electronically Signed   By: Karen Kays M.D.   On: 05/13/2023 14:26     Medical Consultants:   None.   Subjective:    Jody Mcclure no complaints relates her breathing is about the same  Objective:    Vitals:   05/13/23 2002 05/13/23 2005 05/14/23 0000 05/14/23 0411  BP: 130/74  128/70 120/79  Pulse: 86  72 70  Resp: 18 (!) 26 19 18   Temp: 98.8 F (37.1 C)  98.8 F (37.1 C) 98.1 F (36.7 C)  TempSrc: Oral  Oral Oral  SpO2: 96%  97% 100%   SpO2: 100 % O2 Flow Rate (L/min): 3 L/min  No intake or output data in the 24 hours ending 05/14/23 0721 There were no vitals filed for this visit.  Exam: General exam: In no acute distress. Respiratory system: Good air movement and still wheezing bilaterally Cardiovascular system: S1 & S2 heard, RRR. No JVD. Gastrointestinal system: Abdomen is nondistended, soft and nontender.  Extremities: No pedal edema. Skin: No rashes, lesions or ulcers Psychiatry: Judgement and insight appear normal. Mood & affect appropriate.    Data Reviewed:    Labs: Basic Metabolic Panel: Recent Labs  Lab 05/13/23 1402 05/14/23 0457  NA 138 137  K 3.0* 4.1  CL 105  104  CO2 23 26  GLUCOSE 175* 118*  BUN 10 7*  CREATININE 0.46 0.41*  CALCIUM 9.0 9.1   GFR Estimated Creatinine Clearance: 54.2 mL/min (A) (by C-G formula based on SCr of 0.41 mg/dL (L)). Liver Function Tests: Recent Labs  Lab 05/13/23 1402  AST 16  ALT 18  ALKPHOS 97  BILITOT 0.7  PROT 7.5  ALBUMIN 3.5   No results for input(s): "LIPASE", "AMYLASE" in the last 168 hours. No results for input(s): "AMMONIA" in the last 168 hours. Coagulation profile No results for input(s): "INR", "PROTIME" in the last 168 hours. COVID-19 Labs  No results for input(s): "DDIMER", "FERRITIN", "LDH",  "CRP" in the last 72 hours.  Lab Results  Component Value Date   SARSCOV2NAA NEGATIVE 05/13/2023   SARSCOV2NAA NEGATIVE 11/21/2018    CBC: Recent Labs  Lab 05/13/23 1402 05/14/23 0457  WBC 16.8* 13.1*  NEUTROABS 13.0*  --   HGB 12.8 13.0  HCT 37.9 40.5  MCV 88.1 90.6  PLT 357 354   Cardiac Enzymes: No results for input(s): "CKTOTAL", "CKMB", "CKMBINDEX", "TROPONINI" in the last 168 hours. BNP (last 3 results) No results for input(s): "PROBNP" in the last 8760 hours. CBG: No results for input(s): "GLUCAP" in the last 168 hours. D-Dimer: No results for input(s): "DDIMER" in the last 72 hours. Hgb A1c: No results for input(s): "HGBA1C" in the last 72 hours. Lipid Profile: No results for input(s): "CHOL", "HDL", "LDLCALC", "TRIG", "CHOLHDL", "LDLDIRECT" in the last 72 hours. Thyroid function studies: No results for input(s): "TSH", "T4TOTAL", "T3FREE", "THYROIDAB" in the last 72 hours.  Invalid input(s): "FREET3" Anemia work up: No results for input(s): "VITAMINB12", "FOLATE", "FERRITIN", "TIBC", "IRON", "RETICCTPCT" in the last 72 hours. Sepsis Labs: Recent Labs  Lab 05/13/23 1402 05/13/23 1411 05/14/23 0457  WBC 16.8*  --  13.1*  LATICACIDVEN  --  <0.3*  --    Microbiology Recent Results (from the past 240 hours)  Resp panel by RT-PCR (RSV, Flu A&B, Covid) Anterior Nasal Swab     Status: Abnormal   Collection Time: 05/13/23  2:02 PM   Specimen: Anterior Nasal Swab  Result Value Ref Range Status   SARS Coronavirus 2 by RT PCR NEGATIVE NEGATIVE Final    Comment: (NOTE) SARS-CoV-2 target nucleic acids are NOT DETECTED.  The SARS-CoV-2 RNA is generally detectable in upper respiratory specimens during the acute phase of infection. The lowest concentration of SARS-CoV-2 viral copies this assay can detect is 138 copies/mL. A negative result does not preclude SARS-Cov-2 infection and should not be used as the sole basis for treatment or other patient management  decisions. A negative result may occur with  improper specimen collection/handling, submission of specimen other than nasopharyngeal swab, presence of viral mutation(s) within the areas targeted by this assay, and inadequate number of viral copies(<138 copies/mL). A negative result must be combined with clinical observations, patient history, and epidemiological information. The expected result is Negative.  Fact Sheet for Patients:  BloggerCourse.com  Fact Sheet for Healthcare Providers:  SeriousBroker.it  This test is no t yet approved or cleared by the [place] FDA and  has been authorized for detection and/or diagnosis of SARS-CoV-2 by FDA under an Emergency Use Authorization (EUA). This EUA will remain  in effect (meaning this test can be used) for the duration of the COVID-19 declaration under Section 564(b)(1) of the Act, 21 U.S.C.section 360bbb-3(b)(1), unless the authorization is terminated  or revoked sooner.       Influenza A by  PCR NEGATIVE NEGATIVE Final   Influenza B by PCR NEGATIVE NEGATIVE Final    Comment: (NOTE) The Xpert Xpress SARS-CoV-2/FLU/RSV plus assay is intended as an aid in the diagnosis of influenza from Nasopharyngeal swab specimens and should not be used as a sole basis for treatment. Nasal washings and aspirates are unacceptable for Xpert Xpress SARS-CoV-2/FLU/RSV testing.  Fact Sheet for Patients: BloggerCourse.com  Fact Sheet for Healthcare Providers: SeriousBroker.it  This test is not yet approved or cleared by the [place] FDA and has been authorized for detection and/or diagnosis of SARS-CoV-2 by FDA under an Emergency Use Authorization (EUA). This EUA will remain in effect (meaning this test can be used) for the duration of the COVID-19 declaration under Section 564(b)(1) of the Act, 21 U.S.C. section 360bbb-3(b)(1), unless the  authorization is terminated or revoked.     Resp Syncytial Virus by PCR POSITIVE (A) NEGATIVE Final    Comment: (NOTE) Fact Sheet for Patients: BloggerCourse.com  Fact Sheet for Healthcare Providers: SeriousBroker.it  This test is not yet approved or cleared by the [place] FDA and has been authorized for detection and/or diagnosis of SARS-CoV-2 by FDA under an Emergency Use Authorization (EUA). This EUA will remain in effect (meaning this test can be used) for the duration of the COVID-19 declaration under Section 564(b)(1) of the Act, 21 U.S.C. section 360bbb-3(b)(1), unless the authorization is terminated or revoked.  Performed at [facility], [address] [place], [place] [address]      Medications:    enoxaparin (LOVENOX) injection  40 mg Subcutaneous Q24H   fluticasone  1 spray Each Nare Daily   fluticasone furoate-vilanterol  1 puff Inhalation Daily   And   umeclidinium bromide  1 puff Inhalation Daily   methylPREDNISolone (SOLU-MEDROL) injection  40 mg Intravenous Q12H   pravastatin  80 mg Oral QPM   sertraline  150 mg Oral Daily   Continuous Infusions:    LOS: 0 days   Marinda Elk  [facility]  05/14/2023, 7:21 AM

## 2023-05-15 DIAGNOSIS — J441 Chronic obstructive pulmonary disease with (acute) exacerbation: Secondary | ICD-10-CM | POA: Diagnosis not present

## 2023-05-15 MED ORDER — PREDNISONE 10 MG PO TABS: ORAL_TABLET | ORAL | 0 refills | Status: DC

## 2023-05-15 NOTE — Discharge Summary (Signed)
Physician Discharge Summary  Jody Mcclure WUJ:[id number] DOB: 1955/05/07 DOA: 05/13/2023  PCP: Philomena Doheny, MD  Admit date: 05/13/2023 Discharge date: 05/15/2023  Admitted From: Home Disposition:  home  Recommendations for Outpatient Follow-up:  Follow up with PCP in 1-2 weeks Please obtain BMP/CBC in one week   Home Health:No Equipment/Devices:None  Discharge Condition:Stable CODE STATUS:Full Diet recommendation: Heart Healthy   Brief/Interim Summary: 68 y.o. female past medical history of COPD, essential hypertension hyperlipidemia was admitted for an acute COPD exacerbation in the setting of RSV infection.  She started having symptoms of cough shortness of breath and dyspnea on exertion about 4 days prior to admission denies a fever.   Discharge Diagnoses:  Active Problems:   COPD exacerbation (HCC)  COPD exacerbation due to RSV infection: Chest x-ray showed hyperinflation with no infiltrates or opacities. She was started on oxygen steroids and inhalers. Leukocytosis improved. We were able to wean him to room air. She will continue her short steroid taper as an outpatient. She was able to tolerate her diet.  Hyperlipidemia: Continue statins.  Hypokalemia: Repleted now improved.  Discharge Instructions  Discharge Instructions     Diet - low sodium heart healthy   Complete by: As directed    Increase activity slowly   Complete by: As directed       Allergies as of 05/15/2023       Reactions   Codeine Nausea And Vomiting   Sulfa Antibiotics Rash        Medication List     STOP taking these medications    benzonatate 100 MG capsule Commonly known as: Tessalon Perles   betamethasone valerate ointment 0.1 % Commonly known as: VALISONE   diclofenac Sodium 1 % Gel Commonly known as: VOLTAREN   guaiFENesin 100 MG/5ML liquid Commonly known as: ROBITUSSIN   hydrocortisone 2.5 % lotion       TAKE these medications    acetaminophen 500  MG tablet Commonly known as: TYLENOL Take 1,500 mg by mouth every 6 (six) hours as needed for mild pain (pain score 1-3) or moderate pain (pain score 4-6).   albuterol (2.5 MG/3ML) 0.083% nebulizer solution Commonly known as: PROVENTIL Take 3 mLs (2.5 mg total) by nebulization 2 (two) times daily as needed for wheezing or shortness of breath.   albuterol 108 (90 Base) MCG/ACT inhaler Commonly known as: VENTOLIN HFA Inhale 2 puffs into the lungs every 6 (six) hours as needed for wheezing or shortness of breath.   amLODipine 5 MG tablet Commonly known as: NORVASC Take 1 tablet (5 mg total) by mouth daily. What changed: when to take this   Depend Easy Fit Undergarments Misc 1 Units by Does not apply route 3 (three) times daily.   fluticasone 50 MCG/ACT nasal spray Commonly known as: FLONASE Place 1 spray into both nostrils daily.   hydrOXYzine 25 MG tablet Commonly known as: ATARAX Take 1 tablet (25 mg total) by mouth 3 (three) times daily as needed.   MULTIVITAMIN ADULTS PO Take 1 tablet by mouth at bedtime.   pravastatin 80 MG tablet Commonly known as: PRAVACHOL Take 1 tablet (80 mg total) by mouth every evening.   predniSONE 10 MG tablet Commonly known as: DELTASONE Takes  3 tablets for 1 days, then 2 tabs for 1 days, then 1 tab for 1 days, and then stop.   sertraline 100 MG tablet Commonly known as: ZOLOFT Take 1.5 tablets (150 mg total) by mouth daily.   Trelegy Ellipta 100-62.5-25 MCG/ACT Aepb  Generic drug: Fluticasone-Umeclidin-Vilant Inhale 1 puff into the lungs daily for 90 doses.        Allergies  Allergen Reactions   Codeine Nausea And Vomiting   Sulfa Antibiotics Rash    Consultations: None   Procedures/Studies: DG Chest Port 1 View Result Date: 05/13/2023 CLINICAL DATA:  Shortness of breath EXAM: PORTABLE CHEST 1 VIEW COMPARISON:  X-ray 11/21/2018.  CTA as well FINDINGS: Hyperinflation. No pneumothorax or effusion. No edema. Normal  cardiopericardial silhouette. Chronic lung changes. More focal asymmetric opacity at the left lung base. Old left rib fractures. Films are under penetrated. IMPRESSION: Hyperinflation with chronic changes. There is asymmetric opacity left lung base. A subtle infiltrate is possible. Recommend follow-up Electronically Signed   By: Karen Kays M.D.   On: 05/13/2023 14:26   (Echo, Carotid, EGD, Colonoscopy, ERCP)    Subjective: She is ready to go home  Discharge Exam: Vitals:   05/15/23 0527 05/15/23 0935  BP: (!) 145/84   Pulse: 66   Resp: 17   Temp: 97.6 F (36.4 C)   SpO2: 95% 93%   Vitals:   05/14/23 1825 05/14/23 2134 05/15/23 0527 05/15/23 0935  BP: (!) 156/79 (!) 148/72 (!) 145/84   Pulse: 75 71 66   Resp: 18 16 17    Temp: 98.4 F (36.9 C) 98.5 F (36.9 C) 97.6 F (36.4 C)   TempSrc:  Oral Oral   SpO2: 95% 94% 95% 93%  Weight:      Height:        General: Pt is alert, awake, not in acute distress Cardiovascular: RRR, S1/S2 +, no rubs, no gallops Respiratory: CTA bilaterally, no wheezing, no rhonchi Abdominal: Soft, NT, ND, bowel sounds + Extremities: no edema, no cyanosis    The results of significant diagnostics from this hospitalization (including imaging, microbiology, ancillary and laboratory) are listed below for reference.     Microbiology: Recent Results (from the past 240 hours)  Resp panel by RT-PCR (RSV, Flu A&B, Covid) Anterior Nasal Swab     Status: Abnormal   Collection Time: 05/13/23  2:02 PM   Specimen: Anterior Nasal Swab  Result Value Ref Range Status   SARS Coronavirus 2 by RT PCR NEGATIVE NEGATIVE Final    Comment: (NOTE) SARS-CoV-2 target nucleic acids are NOT DETECTED.  The SARS-CoV-2 RNA is generally detectable in upper respiratory specimens during the acute phase of infection. The lowest concentration of SARS-CoV-2 viral copies this assay can detect is 138 copies/mL. A negative result does not preclude SARS-Cov-2 infection and  should not be used as the sole basis for treatment or other patient management decisions. A negative result may occur with  improper specimen collection/handling, submission of specimen other than nasopharyngeal swab, presence of viral mutation(s) within the areas targeted by this assay, and inadequate number of viral copies(<138 copies/mL). A negative result must be combined with clinical observations, patient history, and epidemiological information. The expected result is Negative.  Fact Sheet for Patients:  BloggerCourse.com  Fact Sheet for Healthcare Providers:  SeriousBroker.it  This test is no t yet approved or cleared by the [place] FDA and  has been authorized for detection and/or diagnosis of SARS-CoV-2 by FDA under an Emergency Use Authorization (EUA). This EUA will remain  in effect (meaning this test can be used) for the duration of the COVID-19 declaration under Section 564(b)(1) of the Act, 21 U.S.C.section 360bbb-3(b)(1), unless the authorization is terminated  or revoked sooner.       Influenza A by PCR  NEGATIVE NEGATIVE Final   Influenza B by PCR NEGATIVE NEGATIVE Final    Comment: (NOTE) The Xpert Xpress SARS-CoV-2/FLU/RSV plus assay is intended as an aid in the diagnosis of influenza from Nasopharyngeal swab specimens and should not be used as a sole basis for treatment. Nasal washings and aspirates are unacceptable for Xpert Xpress SARS-CoV-2/FLU/RSV testing.  Fact Sheet for Patients: BloggerCourse.com  Fact Sheet for Healthcare Providers: SeriousBroker.it  This test is not yet approved or cleared by the [place] FDA and has been authorized for detection and/or diagnosis of SARS-CoV-2 by FDA under an Emergency Use Authorization (EUA). This EUA will remain in effect (meaning this test can be used) for the duration of the COVID-19 declaration  under Section 564(b)(1) of the Act, 21 U.S.C. section 360bbb-3(b)(1), unless the authorization is terminated or revoked.     Resp Syncytial Virus by PCR POSITIVE (A) NEGATIVE Final    Comment: (NOTE) Fact Sheet for Patients: BloggerCourse.com  Fact Sheet for Healthcare Providers: SeriousBroker.it  This test is not yet approved or cleared by the [place] FDA and has been authorized for detection and/or diagnosis of SARS-CoV-2 by FDA under an Emergency Use Authorization (EUA). This EUA will remain in effect (meaning this test can be used) for the duration of the COVID-19 declaration under Section 564(b)(1) of the Act, 21 U.S.C. section 360bbb-3(b)(1), unless the authorization is terminated or revoked.  Performed at [facility], [address], [place], [place] [address]      Labs: BNP (last 3 results) Recent Labs    05/13/23 1402  BNP 90.1   Basic Metabolic Panel: Recent Labs  Lab 05/13/23 1402 05/14/23 0457  NA 138 137  K 3.0* 4.1  CL 105 104  CO2 23 26  GLUCOSE 175* 118*  BUN 10 7*  CREATININE 0.46 0.41*  CALCIUM 9.0 9.1   Liver Function Tests: Recent Labs  Lab 05/13/23 1402  AST 16  ALT 18  ALKPHOS 97  BILITOT 0.7  PROT 7.5  ALBUMIN 3.5   No results for input(s): "LIPASE", "AMYLASE" in the last 168 hours. No results for input(s): "AMMONIA" in the last 168 hours. CBC: Recent Labs  Lab 05/13/23 1402 05/14/23 0457  WBC 16.8* 13.1*  NEUTROABS 13.0*  --   HGB 12.8 13.0  HCT 37.9 40.5  MCV 88.1 90.6  PLT 357 354   Cardiac Enzymes: No results for input(s): "CKTOTAL", "CKMB", "CKMBINDEX", "TROPONINI" in the last 168 hours. BNP: Invalid input(s): "POCBNP" CBG: No results for input(s): "GLUCAP" in the last 168 hours. D-Dimer No results for input(s): "DDIMER" in the last 72 hours. Hgb A1c No results for input(s): "HGBA1C" in the last 72 hours. Lipid Profile No results for  input(s): "CHOL", "HDL", "LDLCALC", "TRIG", "CHOLHDL", "LDLDIRECT" in the last 72 hours. Thyroid function studies No results for input(s): "TSH", "T4TOTAL", "T3FREE", "THYROIDAB" in the last 72 hours.  Invalid input(s): "FREET3" Anemia work up No results for input(s): "VITAMINB12", "FOLATE", "FERRITIN", "TIBC", "IRON", "RETICCTPCT" in the last 72 hours. Urinalysis    Component Value Date/Time   COLORURINE YELLOW 11/22/2018 0617   APPEARANCEUR CLEAR 11/22/2018 0617   LABSPEC 1.008 11/22/2018 0617   PHURINE 5.0 11/22/2018 0617   GLUCOSEU NEGATIVE 11/22/2018 0617   HGBUR SMALL (A) 11/22/2018 0617   BILIRUBINUR small 08/01/2019 1512   KETONESUR NEGATIVE 11/22/2018 0617   PROTEINUR Negative 08/01/2019 1512   PROTEINUR NEGATIVE 11/22/2018 0617   UROBILINOGEN 0.2 08/01/2019 1512   UROBILINOGEN 0.2 02/23/2013 1058   NITRITE negative  08/01/2019 1512   NITRITE NEGATIVE 11/22/2018 0617   LEUKOCYTESUR Trace (A) 08/01/2019 1512   LEUKOCYTESUR NEGATIVE 11/22/2018 0617   Sepsis Labs Recent Labs  Lab 05/13/23 1402 05/14/23 0457  WBC 16.8* 13.1*   Microbiology Recent Results (from the past 240 hours)  Resp panel by RT-PCR (RSV, Flu A&B, Covid) Anterior Nasal Swab     Status: Abnormal   Collection Time: 05/13/23  2:02 PM   Specimen: Anterior Nasal Swab  Result Value Ref Range Status   SARS Coronavirus 2 by RT PCR NEGATIVE NEGATIVE Final    Comment: (NOTE) SARS-CoV-2 target nucleic acids are NOT DETECTED.  The SARS-CoV-2 RNA is generally detectable in upper respiratory specimens during the acute phase of infection. The lowest concentration of SARS-CoV-2 viral copies this assay can detect is 138 copies/mL. A negative result does not preclude SARS-Cov-2 infection and should not be used as the sole basis for treatment or other patient management decisions. A negative result may occur with  improper specimen collection/handling, submission of specimen other than nasopharyngeal swab,  presence of viral mutation(s) within the areas targeted by this assay, and inadequate number of viral copies(<138 copies/mL). A negative result must be combined with clinical observations, patient history, and epidemiological information. The expected result is Negative.  Fact Sheet for Patients:  BloggerCourse.com  Fact Sheet for Healthcare Providers:  SeriousBroker.it  This test is no t yet approved or cleared by the [place] FDA and  has been authorized for detection and/or diagnosis of SARS-CoV-2 by FDA under an Emergency Use Authorization (EUA). This EUA will remain  in effect (meaning this test can be used) for the duration of the COVID-19 declaration under Section 564(b)(1) of the Act, 21 U.S.C.section 360bbb-3(b)(1), unless the authorization is terminated  or revoked sooner.       Influenza A by PCR NEGATIVE NEGATIVE Final   Influenza B by PCR NEGATIVE NEGATIVE Final    Comment: (NOTE) The Xpert Xpress SARS-CoV-2/FLU/RSV plus assay is intended as an aid in the diagnosis of influenza from Nasopharyngeal swab specimens and should not be used as a sole basis for treatment. Nasal washings and aspirates are unacceptable for Xpert Xpress SARS-CoV-2/FLU/RSV testing.  Fact Sheet for Patients: BloggerCourse.com  Fact Sheet for Healthcare Providers: SeriousBroker.it  This test is not yet approved or cleared by the [place] FDA and has been authorized for detection and/or diagnosis of SARS-CoV-2 by FDA under an Emergency Use Authorization (EUA). This EUA will remain in effect (meaning this test can be used) for the duration of the COVID-19 declaration under Section 564(b)(1) of the Act, 21 U.S.C. section 360bbb-3(b)(1), unless the authorization is terminated or revoked.     Resp Syncytial Virus by PCR POSITIVE (A) NEGATIVE Final    Comment: (NOTE) Fact Sheet for  Patients: BloggerCourse.com  Fact Sheet for Healthcare Providers: SeriousBroker.it  This test is not yet approved or cleared by the [place] FDA and has been authorized for detection and/or diagnosis of SARS-CoV-2 by FDA under an Emergency Use Authorization (EUA). This EUA will remain in effect (meaning this test can be used) for the duration of the COVID-19 declaration under Section 564(b)(1) of the Act, 21 U.S.C. section 360bbb-3(b)(1), unless the authorization is terminated or revoked.  Performed at [facility], [address], [place], [place] [address]      Time coordinating discharge: Over 35 minutes  SIGNED:   Marinda Elk, MD  [facility] 05/15/2023, 10:09 AM Pager   If 7PM-7AM, please contact  night-coverage [email or web address] Password [email or web address]

## 2023-05-15 NOTE — Progress Notes (Signed)
Mobility Specialist - Progress Note  (RA) Pre-mobility: 71 bpm HR, 94% SpO2 During mobility: 82 bpm HR, 88% SpO2 Post-mobility: 76 bpm HR, 92$% SPO2   05/15/23 0929  Mobility  Activity Ambulated independently in hallway  Level of Assistance Independent  Assistive Device None  Distance Ambulated (ft) 120 ft  Range of Motion/Exercises Active  Activity Response Tolerated fair  Mobility Referral Yes  Mobility visit 1 Mobility  Mobility Specialist Start Time (ACUTE ONLY) [id number]  Mobility Specialist Stop Time (ACUTE ONLY) 0929  Mobility Specialist Time Calculation (min) (ACUTE ONLY) 7 min   Pt was found in bed and agreeable to ambulate. Coughing throughout session and felt SOB. At EOS returned to bed with all needs met. SPO2 decreases to 88%, able to recover within seconds. Call bell in reach and RN notified.    Billey Chang Mobility Specialist

## 2023-05-15 NOTE — Plan of Care (Signed)
   Problem: Education: Goal: Knowledge of General Education information will improve Description: Including pain rating scale, medication(s)/side effects and non-pharmacologic comfort measures Outcome: Progressing   Problem: Activity: Goal: Risk for activity intolerance will decrease Outcome: Progressing

## 2023-05-15 NOTE — Progress Notes (Signed)
 Discharge instructions given to patient and all questions were answered.

## 2023-05-15 NOTE — Plan of Care (Signed)
  Problem: Skin Integrity: Goal: Risk for impaired skin integrity will decrease Outcome: Progressing   Problem: Pain Managment: Goal: General experience of comfort will improve and/or be controlled Outcome: Progressing   Problem: Safety: Goal: Ability to remain free from injury will improve Outcome: Progressing

## 2023-06-12 ENCOUNTER — Other Ambulatory Visit: Payer: Self-pay | Admitting: Student

## 2023-06-12 DIAGNOSIS — J449 Chronic obstructive pulmonary disease, unspecified: Secondary | ICD-10-CM

## 2023-07-08 ENCOUNTER — Other Ambulatory Visit: Payer: Self-pay | Admitting: Internal Medicine

## 2023-07-08 DIAGNOSIS — F411 Generalized anxiety disorder: Secondary | ICD-10-CM

## 2023-07-10 NOTE — Telephone Encounter (Signed)
 Medication sent to pharmacy

## 2023-07-17 ENCOUNTER — Other Ambulatory Visit: Payer: Self-pay | Admitting: Internal Medicine

## 2023-07-17 DIAGNOSIS — I10 Essential (primary) hypertension: Secondary | ICD-10-CM

## 2023-08-09 ENCOUNTER — Other Ambulatory Visit: Payer: Self-pay | Admitting: Internal Medicine

## 2023-08-09 DIAGNOSIS — E782 Mixed hyperlipidemia: Secondary | ICD-10-CM

## 2023-08-09 NOTE — Telephone Encounter (Signed)
 Medication sent to pharmacy

## 2023-09-13 ENCOUNTER — Other Ambulatory Visit: Payer: Self-pay | Admitting: Internal Medicine

## 2023-09-13 DIAGNOSIS — J449 Chronic obstructive pulmonary disease, unspecified: Secondary | ICD-10-CM

## 2023-09-13 NOTE — Telephone Encounter (Signed)
 Medication sent to pharmacy

## 2023-10-31 ENCOUNTER — Other Ambulatory Visit: Payer: Self-pay | Admitting: Internal Medicine

## 2023-10-31 DIAGNOSIS — F411 Generalized anxiety disorder: Secondary | ICD-10-CM

## 2023-11-27 ENCOUNTER — Other Ambulatory Visit: Payer: Self-pay

## 2023-11-27 DIAGNOSIS — J449 Chronic obstructive pulmonary disease, unspecified: Secondary | ICD-10-CM

## 2023-12-05 MED ORDER — ALBUTEROL SULFATE HFA 108 (90 BASE) MCG/ACT IN AERS: 2.0000 | INHALATION_SPRAY | Freq: Four times a day (QID) | RESPIRATORY_TRACT | 2 refills | Status: AC | PRN

## 2023-12-21 ENCOUNTER — Other Ambulatory Visit: Payer: Self-pay

## 2023-12-21 DIAGNOSIS — J449 Chronic obstructive pulmonary disease, unspecified: Secondary | ICD-10-CM

## 2023-12-25 MED ORDER — TRELEGY ELLIPTA 100-62.5-25 MCG/ACT IN AEPB: 1.0000 | INHALATION_SPRAY | Freq: Every day | RESPIRATORY_TRACT | 2 refills | Status: DC

## 2024-03-05 ENCOUNTER — Ambulatory Visit

## 2024-03-11 ENCOUNTER — Ambulatory Visit

## 2024-04-02 ENCOUNTER — Ambulatory Visit: Payer: Self-pay | Admitting: Student

## 2024-04-03 ENCOUNTER — Other Ambulatory Visit: Payer: Self-pay

## 2024-04-03 ENCOUNTER — Other Ambulatory Visit: Payer: Self-pay | Admitting: Student

## 2024-04-03 DIAGNOSIS — F411 Generalized anxiety disorder: Secondary | ICD-10-CM

## 2024-04-03 DIAGNOSIS — J449 Chronic obstructive pulmonary disease, unspecified: Secondary | ICD-10-CM

## 2024-04-03 MED ORDER — HYDROXYZINE HCL 25 MG PO TABS: 25.0000 mg | ORAL_TABLET | Freq: Three times a day (TID) | ORAL | 2 refills | Status: AC | PRN

## 2024-04-03 NOTE — Telephone Encounter (Signed)
 Medication sent to pharmacy

## 2024-04-08 ENCOUNTER — Ambulatory Visit: Admitting: Student

## 2024-04-08 NOTE — Progress Notes (Deleted)
" ° °  Established Patient Office Visit  Subjective   Patient ID: Jody Mcclure, female    DOB: 1955/05/02  Age: 68 y.o. MRN: [id number]  No chief complaint on file.   [name] is a 68 y.o. with past medical history of COPD, stress incontinence, joint pain presents today for routine office visit.  Patient was last seen on 05/03/2023.  Patient takes the following medications:  COPD: Trelegy Ellipta  and albuterol  Hypertension: Amlodipine  Joint tenderness: Tylenol  Depression: Atarax  25 mg 3 times daily as needed, Zoloft  150 mg daily HLD pravastatin  80 mg daily Additional medications: Multivitamins, incontinence supplies disposable, Flonase  nasal spray   Review of Systems:  As per assessment and Plan   Objective:     There were no vitals filed for this visit. ***  Physical Exam General: Sitting in chair, no acute distress Cardiovascular: Regular rate Pulmonary: Breathing comfortably Abdomen: Soft, nontender, nondistended MSK: No lower extremity edema bilaterally  {Labs (Optional):23779}  The 10-year ASCVD risk score (Arnett DK, et al., 2019) is: 13.9%    Assessment & Plan:   Patient {GC/GE:[id number]::discussed with,seen with} Dr. {NAMES:[id number]::Chambliss,Chun,Hoffman,Lau,Machen,Narendra,Williams,Winfrey}  COPD -Per chart review, patient was hospitalized on 05/13/2023 for COPD exacerbation in the setting of RSV infection, discharged on steroid taper.  Since then, patient has not had any other ED or hospitalization visits. - Denies any shortness of breath, wheezes, cough, congestion, sore throat - Reports compliance on Trelegy Ellipta  and albuterol  - Refills are sent to the pharmacy  Stress incontinence Patient reports the following symptoms: She continues to use incontinence supplies disposable - Pelvic muscle floor training   Depression PHQ-9 score; GAD-7 score Patient is advised to continue taking Zoloft  150 mg daily and as needed Atarax  25 mg  3 times daily  Health screening exams: Mammogram: Per chart review, last noted on 07/2007. Colonoscopy: DEXA scan:  Problem List Items Addressed This Visit   None   No follow-ups on file.    Toma Edwards, DO "

## 2024-04-08 NOTE — Patient Instructions (Incomplete)
 Thank you, [name] for allowing us  to provide your care today. Today we discussed:  Stress incontinence: - Reduce total daily fluid volume to less than 64 ounces - Limit alcohol and caffeine consumption - Avoid drinking large volumes at 1 time - If you are constipated, take MiraLAX   Referrals: -  New medications: -  I have ordered the following labs for you:  Lab Orders  No laboratory test(s) ordered today     I will call if any are abnormal. All of your labs can be accessed through My Chart.   My Chart Access: [email or web address]  Please follow-up in:    We look forward to seeing you next time. Please call our clinic at [phone] if you have any questions or concerns. The best time to call is Monday-Friday from 9am-4pm, but there is someone available 24/7. If after hours or the weekend, call the main hospital number and ask for the Internal Medicine Resident On-Call. If you need medication refills, please notify your pharmacy one week in advance and they will send us  a request.   Thank you for letting us  take part in your care. Wishing you the best!  Jody Barren, DO 04/08/2024, 7:13 AM [facility]

## 2024-04-14 NOTE — Progress Notes (Deleted)
 "  CC: ***  HPI:  Jody Mcclure is a 69 y.o. female living with a history stated below and presents today for ***. Please see problem based assessment and plan for additional details.  Past Medical History:  Diagnosis Date   Anxiety    Depression    Hyperlipidemia    Hypertension     Medications Ordered Prior to Encounter[1]  Family History  Problem Relation Age of Onset   Cancer Mother        lung   Hyperlipidemia Mother    Hypertension Father    Hyperlipidemia Father    Colon cancer Neg Hx    Colon polyps Neg Hx    Rectal cancer Neg Hx    Stomach cancer Neg Hx     Social History   Socioeconomic History   Marital status: Single    Spouse name: Not on file   Number of children: 2   Years of education: Not on file   Highest education level: Not on file  Occupational History   Occupation: Unemployed   Tobacco Use   Smoking status: Former    Current packs/day: 0.00    Average packs/day: 0.5 packs/day    Types: Cigarettes    Quit date: 01/03/2018    Years since quitting: 6.2   Smokeless tobacco: Never   Tobacco comments:    1 puff occassionally  Vaping Use   Vaping status: Never Used  Substance and Sexual Activity   Alcohol use: Yes    Alcohol/week: 2.0 standard drinks of alcohol    Types: 2 Cans of beer per week    Comment: 1 daily   Drug use: Not Currently    Types: Cocaine   Sexual activity: Yes  Other Topics Concern   Not on file  Social History Narrative   Daily caffeine    Social Drivers of Health   Tobacco Use: Medium Risk (05/13/2023)   Patient History    Smoking Tobacco Use: Former    Smokeless Tobacco Use: Never    Passive Exposure: Not on file  Financial Resource Strain: Low Risk (01/19/2023)   Overall Financial Resource Strain (CARDIA)    Difficulty of Paying Living Expenses: Not hard at all  Food Insecurity: No Food Insecurity (05/14/2023)   Hunger Vital Sign    Worried About Running Out of Food in the Last Year: Never true    Ran  Out of Food in the Last Year: Never true  Transportation Needs: No Transportation Needs (05/14/2023)   PRAPARE - Administrator, Civil Service (Medical): No    Lack of Transportation (Non-Medical): No  Physical Activity: Sufficiently Active (01/19/2023)   Exercise Vital Sign    Days of Exercise per Week: 5 days    Minutes of Exercise per Session: 50 min  Stress: No Stress Concern Present (01/19/2023)   [employer] of Occupational Health - Occupational Stress Questionnaire    Feeling of Stress : Not at all  Social Connections: Moderately Isolated (05/13/2023)   Social Connection and Isolation Panel    Frequency of Communication with Friends and Family: More than three times a week    Frequency of Social Gatherings with Friends and Family: Three times a week    Attends Religious Services: Never    Active Member of Clubs or Organizations: No    Attends Banker Meetings: Never    Marital Status: Living with partner  Intimate Partner Violence: Not At Risk (05/14/2023)   Humiliation, Afraid, Rape, and  Kick questionnaire    Fear of Current or Ex-Partner: No    Emotionally Abused: No    Physically Abused: No    Sexually Abused: No  Depression (PHQ2-9): Medium Risk (01/19/2023)   Depression (PHQ2-9)    PHQ-2 Score: 7  Alcohol Screen: Low Risk (01/19/2023)   Alcohol Screen    Last Alcohol Screening Score (AUDIT): 2  Housing: Low Risk (05/14/2023)   Housing Stability Vital Sign    Unable to Pay for Housing in the Last Year: No    Number of Times Moved in the Last Year: 0    Homeless in the Last Year: No  Utilities: Not At Risk (05/13/2023)   [employer] Utilities    Threatened with loss of utilities: No  Health Literacy: Adequate Health Literacy (01/19/2023)   B1300 Health Literacy    Frequency of need for help with medical instructions: Never    Review of Systems: ROS negative except for what is noted on the assessment and plan.  There were no vitals filed for this  visit.  Physical Exam  Physical Exam: Constitutional: well-appearing *** sitting in ***, in no acute distress HENT: normocephalic atraumatic, mucous membranes moist Eyes: conjunctiva non-erythematous Neck: supple Cardiovascular: regular rate and rhythm, no m/r/g Pulmonary/Chest: normal work of breathing on room air, lungs clear to auscultation bilaterally Abdominal: soft, non-tender, non-distended MSK: *** Neurological: alert & oriented x 3, 5/5 strength in bilateral upper and lower extremities, normal gait Skin: warm and dry Psych: ***  Assessment & Plan:   No problem-specific Assessment & Plan notes found for this encounter.   Patient {GC/[id number]::discussed with,seen with} Dr. {[id number]::Tpoopjfd,Z. Hoffman,Winfrey,Narendra,Chun,Chambliss,Lau,Machen}  Armando Rossetti M.D [facility], PGY-1 Phone: [phone] Date 04/14/2024 Time 11:35 PM    Jody Mcclure is a 69 year old woman with COPD on Trelegy and albuterol  PRN without recent exacerbations, well-controlled hypertension on amlodipine , hyperlipidemia on high-intensity pravastatin , chronic bilateral knee arthralgia and lumbar radiculopathy managed conservatively with acetaminophen  and topical NSAIDs, stress urinary incontinence managed with disposable supplies, GERD on omeprazole , depression and generalized anxiety disorder stable on sertraline  and hydroxyzine , mild dementia, bilateral carpal tunnel syndrome, and intermittent leg cramps, presenting for routine follow-up of chronic medical conditions and healthcare maintenance after a January 2025 visit notable for viral URI symptoms without COPD flare, stable vital signs, weight loss over the past year, and no recent laboratory evaluation.   1. Chronic Obstructive Pulmonary Disease (COPD) (HCC)  Assessment: Stable COPD on triple inhaler therapy. No recent exacerbations. Last visit notable for viral URI symptoms without fever,  hypoxia, increased sputum, or dyspnea. Wheezing previously noted but patient clinically stable and satisfied with current regimen.  Plan:  Continue Trelegy Ellipta  daily  Continue albuterol  inhaler PRN  Reinforce inhaler technique and adherence  Review COPD action plan and return precautions  Encourage vaccination updates as appropriate  2. Hypertension  Assessment: Blood pressure well controlled on amlodipine  5 mg daily. No symptoms of hypotension or end-organ damage. Prior elevated reading improved.  Plan:  Continue amlodipine  5 mg daily  Monitor BP at each visit  Consider BMP if medication adjustments are required  3. Hyperlipidemia with Hypertriglyceridemia  Assessment: On pravastatin  80 mg daily. ASCVD 10-year risk approximately 10.6%. No reported statin intolerance.  Plan:  Continue pravastatin  80 mg daily  Consider repeat lipid panel if not obtained within the past year  Reinforce diet and lifestyle counseling  4. Chronic Bilateral Knee Pain / Arthralgia  Assessment: Chronic mechanical knee pain, worse with activity and cold weather, improved  with acetaminophen  and topical diclofenac . No prior imaging. No red flag symptoms.  Plan:  Continue acetaminophen  and topical diclofenac  as needed Avoid NSAIDs given comorbidities Discuss imaging or intra-articular steroid injections if pain worsens Reassess functional status at follow-up    [1]  Current Outpatient Medications on File Prior to Visit  Medication Sig Dispense Refill   acetaminophen  (TYLENOL ) 500 MG tablet Take 1,500 mg by mouth every 6 (six) hours as needed for mild pain (pain score 1-3) or moderate pain (pain score 4-6).     albuterol  (PROVENTIL ) (2.5 MG/3ML) 0.083% nebulizer solution Take 3 mLs (2.5 mg total) by nebulization 2 (two) times daily as needed for wheezing or shortness of breath. (Patient not taking: Reported on 05/13/2023) 75 mL 0   albuterol  (VENTOLIN  HFA) 108 (90 Base) MCG/ACT inhaler  Inhale 2 puffs into the lungs every 6 (six) hours as needed for wheezing or shortness of breath. 6.7 g 2   amLODipine  (NORVASC ) 5 MG tablet Take 1 tablet (5 mg total) by mouth at bedtime. 30 tablet 2   fluticasone  (FLONASE ) 50 MCG/ACT nasal spray Place 1 spray into both nostrils daily. 9.9 mL 2   hydrOXYzine  (ATARAX ) 25 MG tablet Take 1 tablet (25 mg total) by mouth 3 (three) times daily as needed. 270 tablet 2   Incontinence Supply Disposable (DEPEND EASY FIT UNDERGARMENTS) MISC 1 Units by Does not apply route 3 (three) times daily. 100 each 11   Multiple Vitamins-Minerals (MULTIVITAMIN ADULTS PO) Take 1 tablet by mouth at bedtime.     pravastatin  (PRAVACHOL ) 80 MG tablet Take 1 tablet (80 mg total) by mouth every evening. 90 tablet 2   sertraline  (ZOLOFT ) 100 MG tablet Take 1.5 tablets (150 mg total) by mouth daily. 135 tablet 1   TRELEGY ELLIPTA  100-62.5-25 MCG/ACT AEPB Inhale 1 puff into the lungs daily. 60 each 2   [DISCONTINUED] omeprazole  (PRILOSEC  OTC) 20 MG tablet Take 1 tablet (20 mg total) by mouth daily. 30 tablet 3   No current facility-administered medications on file prior to visit.   "

## 2024-04-15 ENCOUNTER — Ambulatory Visit: Payer: Self-pay

## 2024-04-18 ENCOUNTER — Ambulatory Visit (INDEPENDENT_AMBULATORY_CARE_PROVIDER_SITE_OTHER)

## 2024-04-18 VITALS — BP 121/77 | HR 67 | Temp 97.6°F | Ht 60.0 in | Wt 112.6 lb

## 2024-04-18 DIAGNOSIS — F418 Other specified anxiety disorders: Secondary | ICD-10-CM

## 2024-04-18 DIAGNOSIS — Z0000 Encounter for general adult medical examination without abnormal findings: Secondary | ICD-10-CM | POA: Insufficient documentation

## 2024-04-18 DIAGNOSIS — M150 Primary generalized (osteo)arthritis: Secondary | ICD-10-CM

## 2024-04-18 DIAGNOSIS — F3289 Other specified depressive episodes: Secondary | ICD-10-CM

## 2024-04-18 DIAGNOSIS — Z7901 Long term (current) use of anticoagulants: Secondary | ICD-10-CM

## 2024-04-18 DIAGNOSIS — Z139 Encounter for screening, unspecified: Secondary | ICD-10-CM

## 2024-04-18 DIAGNOSIS — J449 Chronic obstructive pulmonary disease, unspecified: Secondary | ICD-10-CM

## 2024-04-18 DIAGNOSIS — E782 Mixed hyperlipidemia: Secondary | ICD-10-CM

## 2024-04-18 DIAGNOSIS — M1990 Unspecified osteoarthritis, unspecified site: Secondary | ICD-10-CM | POA: Insufficient documentation

## 2024-04-18 DIAGNOSIS — Z23 Encounter for immunization: Secondary | ICD-10-CM

## 2024-04-18 DIAGNOSIS — I10 Essential (primary) hypertension: Secondary | ICD-10-CM

## 2024-04-18 DIAGNOSIS — K219 Gastro-esophageal reflux disease without esophagitis: Secondary | ICD-10-CM

## 2024-04-18 DIAGNOSIS — F411 Generalized anxiety disorder: Secondary | ICD-10-CM

## 2024-04-18 DIAGNOSIS — R252 Cramp and spasm: Secondary | ICD-10-CM

## 2024-04-18 DIAGNOSIS — J439 Emphysema, unspecified: Secondary | ICD-10-CM

## 2024-04-18 DIAGNOSIS — M170 Bilateral primary osteoarthritis of knee: Secondary | ICD-10-CM

## 2024-04-18 MED ORDER — DICLOFENAC SODIUM 1 % EX GEL: 4.0000 g | Freq: Four times a day (QID) | CUTANEOUS | 0 refills | Status: AC

## 2024-04-18 NOTE — Assessment & Plan Note (Signed)
 Jody Mcclure has a history of hyperlipidemia and is currently on high-dose pravastatin  therapy. She reports adherence to her medication without adverse effects. Given her age and cardiovascular risk profile, reassessment of lipid levels is indicated to evaluate treatment efficacy and guide future management. Plan: Continue pravastatin  80 mg nightly  Lipid panel ordered today Reinforced dietary counseling focusing on lipid management

## 2024-04-18 NOTE — Assessment & Plan Note (Signed)
 The patient has a known history of hypertension and is currently treated with amlodipine . Blood pressure was mildly elevated on initial measurement but improved on repeat, suggesting acceptable overall control. She denies symptoms concerning for hypertensive urgency, including headache, vision changes, chest pain, or neurologic deficits. Continued monitoring is appropriate, with periodic assessment of renal function and electrolytes given chronic antihypertensive therapy. Plan:  Continue amlodipine  5 mg nightly CMP ordered to monitor renal function and electrolytes Encouraged home blood pressure monitoring and low-sodium diet

## 2024-04-18 NOTE — Assessment & Plan Note (Signed)
 Ms. Stevick is appropriate for age-based cancer and osteoporosis screening. She denies gastrointestinal or breast symptoms but is due for routine surveillance. Risks, benefits, and purpose of screening were discussed, and the patient agreed to proceed. Plan: Referral placed for screening colonoscopy Mammogram ordered DEXA scan ordered

## 2024-04-18 NOTE — Assessment & Plan Note (Signed)
 The patient reports chronic, gradually progressive bilateral knee pain and pain involving the first DIP joint, without acute injury, swelling, erythema, or systemic symptoms. The distribution, chronicity, and exam findings are most consistent with osteoarthritis rather than inflammatory or traumatic pathology. Symptoms are currently managed conservatively. Plan: Acetaminophen  as needed for pain control Start diclofenac  1% topical gel up to four times daily Encouraged low-impact physical activity and water-based exercises Discussed joint protection and activity modification

## 2024-04-18 NOTE — Assessment & Plan Note (Addendum)
 Jody Mcclure presents for a routine annual physical examination and preventive care visit. She is currently asymptomatic without acute complaints. Her medical history, medications, social history, and health maintenance needs were reviewed in detail. She reports overall stable health and remains functionally independent. Given the interval of approximately one year since her last comprehensive laboratory evaluation, routine screening labs are appropriate at this time to reassess chronic conditions and identify any new abnormalities. Plan:  Ordered CMP with GFR, CBC with differential, lipid panel, and hemoglobin A1c  Counseled on balanced diet, regular physical activity, and medication adherence  Reviewed age-appropriate preventive care and vaccinations

## 2024-04-18 NOTE — Patient Instructions (Addendum)
 Thank you, [name] for allowing us  to provide your care today. During your visit, we reviewed your overall health, preventive screenings, and your chronic joint pain, and we discussed the importance of routine lab monitoring and age-appropriate cancer screening. Labs ordered today include a comprehensive metabolic panel with GFR, lipid profile, CBC with differential, and hemoglobin A1c to evaluate kidney function, cholesterol, blood counts, and blood sugar. Preventive tests and referrals were placed for a screening colonoscopy, mammogram, and bone density study. For your chronic joint pain, we discussed conservative management, and diclofenac  (Voltaren ) 1% topical gel was prescribed to apply 4 grams up to four times daily as needed for pain. No medications were stopped today. Please follow up as scheduled, continue staying active with low-impact exercises such as walking or pool-based therapy, and use acetaminophen  as needed for pain.  I have ordered the following labs for you:  Lab Orders         Comprehensive metabolic panel with GFR         Lipid Profile         CBC with Diff         Hemoglobin A1c       Referrals ordered today:   Referral Orders         Amb Referral to Colonoscopy      I have ordered the following medication/changed the following medications:   Stop the following medications: There are no discontinued medications.   Start the following medications: Meds ordered this encounter  Medications   diclofenac  Sodium (VOLTAREN ) 1 % GEL    Sig: Apply 4 g topically 4 (four) times daily.    Dispense:  4 g    Refill:  0     Follow up: 4 months   Remember:   Should you have any questions or concerns please call the internal medicine clinic at [phone].    Armando Rossetti, M.D [facility]

## 2024-04-18 NOTE — Assessment & Plan Note (Signed)
 The patient has a history of COPD and reports stable respiratory symptoms without recent exacerbations. She denies new or worsening dyspnea, cough, or wheezing. She notes good symptomatic control with Trelegy daily and albuterol  as needed. Physical examination today reveals clear lung fields and normal work of breathing, consistent with stable disease. Plan: Continue Trelegy Ellipta  daily Continue albuterol  inhaler as needed Reinforced smoking abstinence and inhaler adherence

## 2024-04-18 NOTE — Progress Notes (Signed)
 "  CC: Annual physical examination and lab   HPI:  Jody Mcclure is a 69 y.o. female with a history of hypertension, hyperlipidemia, COPD, anxiety, and depression who presents today for an annual physical examination and routine laboratory evaluation. She reports that her last comprehensive lab work was completed approximately one year ago. She denies chest pain, palpitations, dizziness, headache, vision changes, focal neurologic symptoms, abdominal pain, changes in bowel habits, dysuria, or abnormal vaginal bleeding. She has a known history of COPD and reports stable respiratory status without new or worsening shortness of breath, noting good symptom control with Trelegy and albuterol  as needed. She endorses chronic bilateral knee pain and pain involving the first DIP joint, which has been longstanding and gradually progressive, without acute worsening, swelling, redness, or history of trauma. She also reports intermittent fatigue but denies fever, weight loss, night sweats, bleeding, or infectious symptoms. Preventive care and age-appropriate screenings were reviewed, and the patient agreed to proceed with colonoscopy, mammography, and bone density screening today.  Past Medical History:  Diagnosis Date   Anxiety    Depression    Hyperlipidemia    Hypertension     Medications Ordered Prior to Encounter[1]  Family History  Problem Relation Age of Onset   Cancer Mother        lung   Hyperlipidemia Mother    Hypertension Father    Hyperlipidemia Father    Colon cancer Neg Hx    Colon polyps Neg Hx    Rectal cancer Neg Hx    Stomach cancer Neg Hx     Social History   Socioeconomic History   Marital status: Single    Spouse name: Not on file   Number of children: 2   Years of education: Not on file   Highest education level: Not on file  Occupational History   Occupation: Unemployed   Tobacco Use   Smoking status: Former    Current packs/day: 0.00    Average packs/day: 0.5  packs/day    Types: Cigarettes    Quit date: 01/03/2018    Years since quitting: 6.2   Smokeless tobacco: Never   Tobacco comments:    1 puff occassionally  Vaping Use   Vaping status: Never Used  Substance and Sexual Activity   Alcohol use: Yes    Alcohol/week: 2.0 standard drinks of alcohol    Types: 2 Cans of beer per week    Comment: 1 daily   Drug use: Not Currently    Types: Cocaine   Sexual activity: Yes  Other Topics Concern   Not on file  Social History Narrative   Daily caffeine    Social Drivers of Health   Tobacco Use: Medium Risk (05/13/2023)   Patient History    Smoking Tobacco Use: Former    Smokeless Tobacco Use: Never    Passive Exposure: Not on file  Financial Resource Strain: Low Risk (04/18/2024)   Overall Financial Resource Strain (CARDIA)    Difficulty of Paying Living Expenses: Not very hard  Food Insecurity: Food Insecurity Present (04/18/2024)   Epic    Worried About Programme Researcher, Broadcasting/film/video in the Last Year: Sometimes true    Ran Out of Food in the Last Year: Often true  Transportation Needs: Unmet Transportation Needs (04/18/2024)   Epic    Lack of Transportation (Medical): Yes    Lack of Transportation (Non-Medical): Yes  Physical Activity: Sufficiently Active (04/18/2024)   Exercise Vital Sign    Days of Exercise per  Week: 7 days    Minutes of Exercise per Session: 30 min  Stress: Stress Concern Present (04/18/2024)   [employer] of Occupational Health - Occupational Stress Questionnaire    Feeling of Stress: To some extent  Social Connections: Moderately Integrated (04/18/2024)   Social Connection and Isolation Panel    Frequency of Communication with Friends and Family: Twice a week    Frequency of Social Gatherings with Friends and Family: Once a week    Attends Religious Services: 1 to 4 times per year    Active Member of Clubs or Organizations: No    Attends Banker Meetings: Patient unable to answer    Marital Status: Living with  partner  Intimate Partner Violence: Not At Risk (04/18/2024)   Epic    Fear of Current or Ex-Partner: No    Emotionally Abused: No    Physically Abused: No    Sexually Abused: No  Depression (PHQ2-9): Low Risk (04/18/2024)   Depression (PHQ2-9)    PHQ-2 Score: 0  Alcohol Screen: Low Risk (04/18/2024)   Alcohol Screen    Last Alcohol Screening Score (AUDIT): 4  Housing: High Risk (04/18/2024)   Epic    Unable to Pay for Housing in the Last Year: Yes    Number of Times Moved in the Last Year: 0    Homeless in the Last Year: No  Utilities: Not At Risk (04/18/2024)   Epic    Threatened with loss of utilities: No  Health Literacy: Adequate Health Literacy (04/18/2024)   B1300 Health Literacy    Frequency of need for help with medical instructions: Never    Review of Systems: ROS negative except for what is noted on the assessment and plan.  Vitals:   04/18/24 0948 04/18/24 1010  BP: (!) 143/82 121/77  Pulse: 69 67  Temp: 97.6 F (36.4 C)   TempSrc: Oral   SpO2: 95%   Weight: 112 lb 9.6 oz (51.1 kg)   Height: 5' (1.524 m)     Physical Exam  Physical Exam: Constitutional: Well-appearing female sitting comfortably in chair, in no acute distress HENT: Normocephalic, atraumatic, mucous membranes moist Eyes: Conjunctiva non-erythematous Neck: Supple Cardiovascular: Regular rate and rhythm, no murmurs, rubs, or gallops Pulmonary/Chest: Normal work of breathing on room air, lungs clear to auscultation bilaterally Abdominal: Soft, non-tender, non-distended MSK: Mild bilateral knee crepitus with range of motion, no effusion, erythema, or warmth; mild tenderness over first DIP joint without swelling or deformity Neurological: Alert and oriented x3, 5/5 strength in bilateral upper and lower extremities, normal gait Skin: Warm and dry Psych: Calm, cooperative, normal mood and affect  Assessment & Plan:   Hypertension The patient has a known history of hypertension and is currently  treated with amlodipine . Blood pressure was mildly elevated on initial measurement but improved on repeat, suggesting acceptable overall control. She denies symptoms concerning for hypertensive urgency, including headache, vision changes, chest pain, or neurologic deficits. Continued monitoring is appropriate, with periodic assessment of renal function and electrolytes given chronic antihypertensive therapy. Plan:  Continue amlodipine  5 mg nightly CMP ordered to monitor renal function and electrolytes Encouraged home blood pressure monitoring and low-sodium diet  Hypercholesterolemia with hypertriglyceridemia Ms. Herschberger has a history of hyperlipidemia and is currently on high-dose pravastatin  therapy. She reports adherence to her medication without adverse effects. Given her age and cardiovascular risk profile, reassessment of lipid levels is indicated to evaluate treatment efficacy and guide future management. Plan: Continue pravastatin  80 mg nightly  Lipid  panel ordered today Reinforced dietary counseling focusing on lipid management  COPD (chronic obstructive pulmonary disease) (HCC) The patient has a history of COPD and reports stable respiratory symptoms without recent exacerbations. She denies new or worsening dyspnea, cough, or wheezing. She notes good symptomatic control with Trelegy daily and albuterol  as needed. Physical examination today reveals clear lung fields and normal work of breathing, consistent with stable disease. Plan: Continue Trelegy Ellipta  daily Continue albuterol  inhaler as needed Reinforced smoking abstinence and inhaler adherence  Osteoarthritis The patient reports chronic, gradually progressive bilateral knee pain and pain involving the first DIP joint, without acute injury, swelling, erythema, or systemic symptoms. The distribution, chronicity, and exam findings are most consistent with osteoarthritis rather than inflammatory or traumatic pathology. Symptoms are  currently managed conservatively. Plan: Acetaminophen  as needed for pain control Start diclofenac  1% topical gel up to four times daily Encouraged low-impact physical activity and water-based exercises Discussed joint protection and activity modification  Annual physical exam Ms. Hendricksen presents for a routine annual physical examination and preventive care visit. She is currently asymptomatic without acute complaints. Her medical history, medications, social history, and health maintenance needs were reviewed in detail. She reports overall stable health and remains functionally independent. Given the interval of approximately one year since her last comprehensive laboratory evaluation, routine screening labs are appropriate at this time to reassess chronic conditions and identify any new abnormalities. Plan:  Ordered CMP with GFR, CBC with differential, lipid panel, and hemoglobin A1c  Counseled on balanced diet, regular physical activity, and medication adherence  Reviewed age-appropriate preventive care and vaccinations  Health care maintenance Ms. Deviney is appropriate for age-based cancer and osteoporosis screening. She denies gastrointestinal or breast symptoms but is due for routine surveillance. Risks, benefits, and purpose of screening were discussed, and the patient agreed to proceed. Plan: Referral placed for screening colonoscopy Mammogram ordered DEXA scan ordered   Patient discussed with Dr. Jeanelle Armando Rossetti M.D [facility] Internal Medicine, PGY-1 Phone: [phone] Date 04/18/2024 Time 11:35 AM     [1]  Current Outpatient Medications on File Prior to Visit  Medication Sig Dispense Refill   acetaminophen  (TYLENOL ) 500 MG tablet Take 1,500 mg by mouth every 6 (six) hours as needed for mild pain (pain score 1-3) or moderate pain (pain score 4-6).     albuterol  (PROVENTIL ) (2.5 MG/3ML) 0.083% nebulizer solution Take 3 mLs (2.5 mg total) by nebulization 2 (two) times  daily as needed for wheezing or shortness of breath. (Patient not taking: Reported on 05/13/2023) 75 mL 0   albuterol  (VENTOLIN  HFA) 108 (90 Base) MCG/ACT inhaler Inhale 2 puffs into the lungs every 6 (six) hours as needed for wheezing or shortness of breath. 6.7 g 2   amLODipine  (NORVASC ) 5 MG tablet Take 1 tablet (5 mg total) by mouth at bedtime. 30 tablet 2   fluticasone  (FLONASE ) 50 MCG/ACT nasal spray Place 1 spray into both nostrils daily. 9.9 mL 2   hydrOXYzine  (ATARAX ) 25 MG tablet Take 1 tablet (25 mg total) by mouth 3 (three) times daily as needed. 270 tablet 2   Incontinence Supply Disposable (DEPEND EASY FIT UNDERGARMENTS) MISC 1 Units by Does not apply route 3 (three) times daily. 100 each 11   Multiple Vitamins-Minerals (MULTIVITAMIN ADULTS PO) Take 1 tablet by mouth at bedtime.     pravastatin  (PRAVACHOL ) 80 MG tablet Take 1 tablet (80 mg total) by mouth every evening. 90 tablet 2   sertraline  (ZOLOFT ) 100 MG tablet Take 1.5 tablets (  150 mg total) by mouth daily. 135 tablet 1   TRELEGY ELLIPTA  100-62.5-25 MCG/ACT AEPB Inhale 1 puff into the lungs daily. 60 each 2   [DISCONTINUED] omeprazole  (PRILOSEC  OTC) 20 MG tablet Take 1 tablet (20 mg total) by mouth daily. 30 tablet 3   No current facility-administered medications on file prior to visit.   "

## 2024-04-19 ENCOUNTER — Telehealth: Payer: Self-pay | Admitting: *Deleted

## 2024-04-19 LAB — CBC WITH DIFFERENTIAL/PLATELET
Basophils Absolute: 0.1 x10E3/uL (ref 0.0–0.2)
Basos: 1 %
EOS (ABSOLUTE): 0.2 x10E3/uL (ref 0.0–0.4)
Eos: 3 %
Hematocrit: 47.2 % — ABNORMAL HIGH (ref 34.0–46.6)
Hemoglobin: 15.1 g/dL (ref 11.1–15.9)
Immature Grans (Abs): 0 x10E3/uL (ref 0.0–0.1)
Immature Granulocytes: 0 %
Lymphocytes Absolute: 1.5 x10E3/uL (ref 0.7–3.1)
Lymphs: 25 %
MCH: 29.6 pg (ref 26.6–33.0)
MCHC: 32 g/dL (ref 31.5–35.7)
MCV: 93 fL (ref 79–97)
Monocytes Absolute: 0.5 x10E3/uL (ref 0.1–0.9)
Monocytes: 9 %
Neutrophils Absolute: 3.7 x10E3/uL (ref 1.4–7.0)
Neutrophils: 62 %
Platelets: 316 x10E3/uL (ref 150–450)
RBC: 5.1 x10E6/uL (ref 3.77–5.28)
RDW: 13.2 % (ref 11.7–15.4)
WBC: 5.9 x10E3/uL (ref 3.4–10.8)

## 2024-04-19 LAB — COMPREHENSIVE METABOLIC PANEL WITH GFR
ALT: 14 IU/L (ref 0–32)
AST: 19 IU/L (ref 0–40)
Albumin: 4.2 g/dL (ref 3.9–4.9)
Alkaline Phosphatase: 83 IU/L (ref 49–135)
BUN/Creatinine Ratio: 17 (ref 12–28)
BUN: 9 mg/dL (ref 8–27)
Bilirubin Total: 0.3 mg/dL (ref 0.0–1.2)
CO2: 18 mmol/L — ABNORMAL LOW (ref 20–29)
Calcium: 9.6 mg/dL (ref 8.7–10.3)
Chloride: 105 mmol/L (ref 96–106)
Creatinine, Ser: 0.54 mg/dL — ABNORMAL LOW (ref 0.57–1.00)
Globulin, Total: 2.5 g/dL (ref 1.5–4.5)
Glucose: 104 mg/dL — ABNORMAL HIGH (ref 70–99)
Potassium: 3.9 mmol/L (ref 3.5–5.2)
Sodium: 141 mmol/L (ref 134–144)
Total Protein: 6.7 g/dL (ref 6.0–8.5)
eGFR: 101 mL/min/1.73

## 2024-04-19 LAB — LIPID PANEL
Chol/HDL Ratio: 6.1 ratio — ABNORMAL HIGH (ref 0.0–4.4)
Cholesterol, Total: 212 mg/dL — ABNORMAL HIGH (ref 100–199)
HDL: 35 mg/dL — ABNORMAL LOW
LDL Chol Calc (NIH): 136 mg/dL — ABNORMAL HIGH (ref 0–99)
Triglycerides: 228 mg/dL — ABNORMAL HIGH (ref 0–149)
VLDL Cholesterol Cal: 41 mg/dL — ABNORMAL HIGH (ref 5–40)

## 2024-04-19 LAB — HEMOGLOBIN A1C
Est. average glucose Bld gHb Est-mCnc: 103 mg/dL
Hgb A1c MFr Bld: 5.2 % (ref 4.8–5.6)

## 2024-04-19 NOTE — Progress Notes (Unsigned)
 Care Guide Pharmacy Note  04/19/2024 Name: Jody Mcclure MRN: [id number] DOB: 02-Aug-1955  Referred By: Edgardo Pontiff, DO Reason for referral: Complex Care Management (Initial outreach to schedule referral with PharmD)   Jody Mcclure is a 69 y.o. year old female who is a primary care patient of Edgardo Pontiff, DO.  Jody Mcclure was referred to the pharmacist for assistance related to: HTN  A successful telephone outreach was attempted today to contact the patient who was referred to the pharmacy team for assistance with medication management. Additional attempts will be made to contact the patient.Patient would like to know what referral is for. Routed message to pharmacist.   Harlene Satterfield  [facility]  [facility]Based [facility], [facility] Guide  Direct Dial: [phone]  Fax [phone]

## 2024-04-21 ENCOUNTER — Ambulatory Visit: Payer: Self-pay

## 2024-04-22 ENCOUNTER — Telehealth: Payer: Self-pay

## 2024-04-22 NOTE — Telephone Encounter (Signed)
 COPD : RN Telephone Call   Patient ID: LOVELLA HARDIE, female    DOB: Mar 09, 1956  Age: 69 y.o. MRN: [id number]  Ms.JIMIA GENTLES is a 69 y.o. who was called about COPD management.   Patient reports that they are taking the following medications for COPD:  yes / trelegy and albuterol    Patient reports they DO or DO NOT have a prior pulmonologist who they follow:  Treated throught  our clinic  Please document smoking status, how many packs? Ready to quit ?  - Smoking Hx: She quit smoking approximately 1 year ago.   mMRC (Modified Medical Research [employer]) Dyspnea Scale   Ask the following questions and please check the box that describes the most.   Choose ONE>   []  Grade 0: I only get breathless with strenuous exercise   []  Grade 1: I get short of breath when hurry on level ground or walking up a slight hill   [x]  Grade 2: On level Ground, I walk slower than people of the same age because of breathlessness or have to stop for breath when walking my own pace   []  Grade 3: I Stop for breath after walking about 100 yards or after a few minutes on level ground   []  Grade 4: I am too breathless to leave the house, or I am breathless when dressing    CAT (COPD Assessment Test)   Please ask each of the following questions and select between 0-5 based on what describes the patient most accurately.  On a scale 0 to 5, do you never cough or cough all the time: 3  1 3 3   0 (I am not limited doing any activities at home) 0 (I am confident leaving my home despite my lung condition) 0 (I sleep soundly) On a scale 0 to 5, how would you describe your energy?: 3  Total: 13

## 2024-04-23 NOTE — Progress Notes (Signed)
 See referral notes from PharmD closing outreach     Referral # Creation Date Referral Status Status Update   [id number]     Harlene Satterfield  [facility]  Value-Based Care Institute, [facility] Guide  Direct Dial: [phone]  Fax [phone]

## 2024-04-24 ENCOUNTER — Ambulatory Visit: Payer: Self-pay

## 2024-05-02 NOTE — Progress Notes (Signed)
 Internal Medicine Clinic Attending  Case discussed with the resident at the time of the visit.  We reviewed the resident's history and exam and pertinent patient test results.  I agree with the assessment, diagnosis, and plan of care documented in the resident's note.

## 2024-05-03 ENCOUNTER — Other Ambulatory Visit: Payer: Self-pay

## 2024-05-03 DIAGNOSIS — E782 Mixed hyperlipidemia: Secondary | ICD-10-CM

## 2024-05-03 MED ORDER — PRAVASTATIN SODIUM 80 MG PO TABS: 80.0000 mg | ORAL_TABLET | Freq: Every evening | ORAL | 2 refills | Status: AC

## 2024-05-03 NOTE — Telephone Encounter (Signed)
 Medication sent to pharmacy

## 2024-07-11 ENCOUNTER — Ambulatory Visit: Payer: Self-pay | Admitting: Student
